# Patient Record
Sex: Male | Born: 1955 | Race: Black or African American | Hispanic: No | Smoking: Former smoker
Health system: Southern US, Community
[De-identification: ages and names within clinical notes are randomized; demographics above are authoritative.]

## PROBLEM LIST (undated history)

## (undated) DIAGNOSIS — E119 Type 2 diabetes mellitus without complications: Secondary | ICD-10-CM

## (undated) DIAGNOSIS — E785 Hyperlipidemia, unspecified: Secondary | ICD-10-CM

## (undated) DIAGNOSIS — J189 Pneumonia, unspecified organism: Secondary | ICD-10-CM

## (undated) DIAGNOSIS — K219 Gastro-esophageal reflux disease without esophagitis: Secondary | ICD-10-CM

## (undated) DIAGNOSIS — I639 Cerebral infarction, unspecified: Secondary | ICD-10-CM

## (undated) DIAGNOSIS — I10 Essential (primary) hypertension: Secondary | ICD-10-CM

## (undated) HISTORY — DX: Hyperlipidemia, unspecified: E78.5

## (undated) HISTORY — DX: Cerebral infarction, unspecified: I63.9

## (undated) HISTORY — PX: CARPAL TUNNEL RELEASE: SHX101

---

## 1997-12-11 ENCOUNTER — Emergency Department (HOSPITAL_COMMUNITY): Admission: EM | Admit: 1997-12-11 | Discharge: 1997-12-11 | Payer: Self-pay | Admitting: Emergency Medicine

## 1998-02-27 ENCOUNTER — Emergency Department (HOSPITAL_COMMUNITY): Admission: EM | Admit: 1998-02-27 | Discharge: 1998-02-27 | Payer: Self-pay | Admitting: Emergency Medicine

## 1998-05-28 ENCOUNTER — Encounter: Admission: RE | Admit: 1998-05-28 | Discharge: 1998-06-13 | Payer: Self-pay | Admitting: Orthopedic Surgery

## 1998-06-13 ENCOUNTER — Inpatient Hospital Stay (HOSPITAL_COMMUNITY): Admission: EM | Admit: 1998-06-13 | Discharge: 1998-06-15 | Payer: Self-pay | Admitting: Internal Medicine

## 1999-09-19 ENCOUNTER — Ambulatory Visit (HOSPITAL_BASED_OUTPATIENT_CLINIC_OR_DEPARTMENT_OTHER): Admission: RE | Admit: 1999-09-19 | Discharge: 1999-09-19 | Payer: Self-pay | Admitting: Orthopedic Surgery

## 1999-10-17 ENCOUNTER — Ambulatory Visit (HOSPITAL_BASED_OUTPATIENT_CLINIC_OR_DEPARTMENT_OTHER): Admission: RE | Admit: 1999-10-17 | Discharge: 1999-10-17 | Payer: Self-pay | Admitting: Orthopedic Surgery

## 2000-05-10 ENCOUNTER — Encounter: Payer: Self-pay | Admitting: Emergency Medicine

## 2000-05-10 ENCOUNTER — Emergency Department (HOSPITAL_COMMUNITY): Admission: EM | Admit: 2000-05-10 | Discharge: 2000-05-10 | Payer: Self-pay | Admitting: Emergency Medicine

## 2001-05-31 ENCOUNTER — Emergency Department (HOSPITAL_COMMUNITY): Admission: EM | Admit: 2001-05-31 | Discharge: 2001-05-31 | Payer: Self-pay | Admitting: Emergency Medicine

## 2007-06-25 ENCOUNTER — Ambulatory Visit: Payer: Self-pay | Admitting: Gastroenterology

## 2007-07-29 ENCOUNTER — Ambulatory Visit: Payer: Self-pay | Admitting: Gastroenterology

## 2007-08-06 ENCOUNTER — Encounter: Payer: Self-pay | Admitting: Gastroenterology

## 2007-08-06 ENCOUNTER — Ambulatory Visit: Payer: Self-pay | Admitting: Gastroenterology

## 2007-12-28 ENCOUNTER — Telehealth: Payer: Self-pay | Admitting: Gastroenterology

## 2007-12-29 ENCOUNTER — Emergency Department (HOSPITAL_COMMUNITY): Admission: EM | Admit: 2007-12-29 | Discharge: 2007-12-29 | Payer: Self-pay | Admitting: Emergency Medicine

## 2007-12-29 DIAGNOSIS — Z860100 Personal history of colon polyps, unspecified: Secondary | ICD-10-CM | POA: Insufficient documentation

## 2007-12-29 DIAGNOSIS — E1165 Type 2 diabetes mellitus with hyperglycemia: Secondary | ICD-10-CM

## 2007-12-30 ENCOUNTER — Ambulatory Visit: Payer: Self-pay | Admitting: Gastroenterology

## 2007-12-30 DIAGNOSIS — R079 Chest pain, unspecified: Secondary | ICD-10-CM

## 2007-12-30 DIAGNOSIS — K219 Gastro-esophageal reflux disease without esophagitis: Secondary | ICD-10-CM

## 2007-12-30 DIAGNOSIS — R112 Nausea with vomiting, unspecified: Secondary | ICD-10-CM

## 2007-12-30 LAB — CONVERTED CEMR LAB
ALT: 36 units/L (ref 0–53)
AST: 26 units/L (ref 0–37)
Albumin: 3.9 g/dL (ref 3.5–5.2)
Alkaline Phosphatase: 57 units/L (ref 39–117)
Amylase: 151 units/L — ABNORMAL HIGH (ref 27–131)
BUN: 13 mg/dL (ref 6–23)
Basophils Absolute: 0.1 10*3/uL (ref 0.0–0.1)
Basophils Relative: 1 % (ref 0.0–3.0)
Bilirubin, Direct: 0.2 mg/dL (ref 0.0–0.3)
CO2: 31 meq/L (ref 19–32)
Calcium: 9.2 mg/dL (ref 8.4–10.5)
Chloride: 101 meq/L (ref 96–112)
Creatinine, Ser: 1.2 mg/dL (ref 0.4–1.5)
Eosinophils Absolute: 0.3 10*3/uL (ref 0.0–0.7)
Eosinophils Relative: 4.3 % (ref 0.0–5.0)
GFR calc Af Amer: 82 mL/min
GFR calc non Af Amer: 68 mL/min
Glucose, Bld: 103 mg/dL — ABNORMAL HIGH (ref 70–99)
HCT: 46.2 % (ref 39.0–52.0)
Hemoglobin: 16.2 g/dL (ref 13.0–17.0)
Hgb A1c MFr Bld: 5.8 % (ref 4.6–6.0)
Lipase: 33 units/L (ref 11.0–59.0)
Lymphocytes Relative: 40.2 % (ref 12.0–46.0)
MCHC: 34.9 g/dL (ref 30.0–36.0)
MCV: 93.9 fL (ref 78.0–100.0)
Monocytes Absolute: 0.6 10*3/uL (ref 0.1–1.0)
Monocytes Relative: 8.9 % (ref 3.0–12.0)
Neutro Abs: 3 10*3/uL (ref 1.4–7.7)
Neutrophils Relative %: 45.6 % (ref 43.0–77.0)
Platelets: 219 10*3/uL (ref 150–400)
Potassium: 4.3 meq/L (ref 3.5–5.1)
RBC: 4.92 M/uL (ref 4.22–5.81)
RDW: 12 % (ref 11.5–14.6)
Sodium: 139 meq/L (ref 135–145)
TSH: 0.42 microintl units/mL (ref 0.35–5.50)
Total Bilirubin: 1 mg/dL (ref 0.3–1.2)
Total Protein: 7.1 g/dL (ref 6.0–8.3)
WBC: 6.7 10*3/uL (ref 4.5–10.5)

## 2008-01-04 ENCOUNTER — Encounter: Payer: Self-pay | Admitting: Gastroenterology

## 2008-01-05 ENCOUNTER — Ambulatory Visit: Payer: Self-pay | Admitting: Gastroenterology

## 2008-01-05 LAB — CONVERTED CEMR LAB: UREASE: NEGATIVE

## 2008-01-06 ENCOUNTER — Ambulatory Visit (HOSPITAL_COMMUNITY): Admission: RE | Admit: 2008-01-06 | Discharge: 2008-01-06 | Payer: Self-pay | Admitting: Gastroenterology

## 2008-01-10 ENCOUNTER — Telehealth: Payer: Self-pay | Admitting: Gastroenterology

## 2008-01-20 ENCOUNTER — Telehealth: Payer: Self-pay | Admitting: Gastroenterology

## 2008-02-16 ENCOUNTER — Telehealth: Payer: Self-pay | Admitting: Gastroenterology

## 2008-02-17 ENCOUNTER — Ambulatory Visit: Payer: Self-pay | Admitting: Internal Medicine

## 2008-02-17 DIAGNOSIS — R1319 Other dysphagia: Secondary | ICD-10-CM

## 2008-02-23 ENCOUNTER — Ambulatory Visit (HOSPITAL_COMMUNITY): Admission: RE | Admit: 2008-02-23 | Discharge: 2008-02-23 | Payer: Self-pay | Admitting: Internal Medicine

## 2008-02-29 ENCOUNTER — Telehealth: Payer: Self-pay | Admitting: Gastroenterology

## 2008-03-07 ENCOUNTER — Ambulatory Visit: Payer: Self-pay | Admitting: Gastroenterology

## 2008-03-10 ENCOUNTER — Ambulatory Visit (HOSPITAL_COMMUNITY): Admission: RE | Admit: 2008-03-10 | Discharge: 2008-03-10 | Payer: Self-pay | Admitting: Gastroenterology

## 2008-03-24 ENCOUNTER — Telehealth: Payer: Self-pay | Admitting: Gastroenterology

## 2008-03-27 ENCOUNTER — Encounter: Payer: Self-pay | Admitting: Gastroenterology

## 2008-03-27 ENCOUNTER — Ambulatory Visit (HOSPITAL_COMMUNITY): Admission: RE | Admit: 2008-03-27 | Discharge: 2008-03-27 | Payer: Self-pay | Admitting: Gastroenterology

## 2008-04-10 ENCOUNTER — Telehealth: Payer: Self-pay | Admitting: Gastroenterology

## 2008-04-20 ENCOUNTER — Ambulatory Visit: Payer: Self-pay | Admitting: Gastroenterology

## 2008-04-20 LAB — CONVERTED CEMR LAB: Anti Nuclear Antibody(ANA): NEGATIVE

## 2008-06-27 ENCOUNTER — Telehealth: Payer: Self-pay | Admitting: Gastroenterology

## 2008-07-11 ENCOUNTER — Encounter (INDEPENDENT_AMBULATORY_CARE_PROVIDER_SITE_OTHER): Payer: Self-pay | Admitting: *Deleted

## 2008-07-25 ENCOUNTER — Ambulatory Visit: Payer: Self-pay | Admitting: Gastroenterology

## 2008-07-26 ENCOUNTER — Telehealth: Payer: Self-pay | Admitting: Gastroenterology

## 2008-08-07 ENCOUNTER — Encounter: Payer: Self-pay | Admitting: Gastroenterology

## 2008-08-07 ENCOUNTER — Ambulatory Visit: Payer: Self-pay | Admitting: Gastroenterology

## 2008-08-09 ENCOUNTER — Encounter: Payer: Self-pay | Admitting: Gastroenterology

## 2008-09-18 ENCOUNTER — Telehealth: Payer: Self-pay | Admitting: Gastroenterology

## 2009-10-25 ENCOUNTER — Telehealth: Payer: Self-pay | Admitting: Gastroenterology

## 2010-04-28 ENCOUNTER — Encounter: Payer: Self-pay | Admitting: Internal Medicine

## 2010-05-09 NOTE — Progress Notes (Signed)
Summary: med concern  Phone Note Call from Patient Call back at [phone]   Caller: Patient Call For: Dr. Jarold Motto Reason for Call: Refill Medication Summary of Call: having trouble filling Lansoprazole... pharmacy telling pt that they have not [facility] [address]... pt would [facility] Initial call taken by: Vallarie Mare,  October 25, 2009 8:45 AM  Follow-up for Phone Call        Talked with pt.  [facility] [address]   Sent as requested. Follow-up by: Ashok Cordia RN,  October 25, 2009 9:09 AM    Prescriptions: LANSOPRAZOLE 30 MG CPDR (LANSOPRAZOLE) take on by mouth once daily  #30 x 11   Entered by:   Ashok Cordia RN   Authorized by:   Mardella Layman MD [facility]   Signed by:   Ashok Cordia RN on 10/25/2009   Method used:   [facility]        [facility] [address] [phone]* (retail)       [address]       [place], [place]  [address]       Ph: [phone] or [phone]       Fax: [phone]   RxID:   [id number]

## 2010-08-20 NOTE — Assessment & Plan Note (Signed)
[facility]                         GASTROENTEROLOGY OFFICE NOTE   BRADIE, LACOCK                          MRN:          [id number]  DATE:07/29/2007                            DOB:          August 04, 1955    The patient is a 55 year old white male referred through the courtesy of  Dr. Farris Has for evaluation of guaiac positive stools determined on  physical examination on June 01, 2007.   The patient has had adult onset diabetes mellitus for the last two years  well controlled with Metformin 500 mg twice a day and glipizide XL 10 mg  twice a day.  To me he really denies any GI or general medical problems  at this time and apparently has had no complications from his diabetes.  He recently saw Dr. Farris Has for his annual checkup and had guaiac  positive stools.  Laboratory tests are still pending from that  examination.  The patient simply denies melena or hematochezia, bowel  irregularity, abdominal pain, or any upper GI or hepatobiliary  complaints.  His appetite is good.  His weight is stable.  He has never  had previous endoscopic or barium studies of his bowels.   FAMILY HISTORY:  Remarkable for breast cancer in his mother and prostate  cancer in a brother.   SOCIAL HISTORY:  He is separated and lives alone.  He has a twelfth  grade education and works for [employer].  He smokes a pack of cigarettes every  several days does not have any history of ethanol abuse or dependence.   REVIEW OF SYSTEMS:  Noncontributory except for some vague arthralgias.   PHYSICAL EXAMINATION:  GENERAL:  He is a healthy appearing black male in  no distress appearing his stated age.  VITAL SIGNS:  He is 5 feet 11-1/2 inches and weighs 219 pounds.  Blood  pressure 122/80, pulse 72 and regular.  I could not appreciate stigmata  of chronic liver disease or thyromegaly.  CHEST:  Clear.  HEART:  Regular rhythm without murmurs, rubs, or gallops.  ABDOMEN:  There is no  hepatosplenomegaly, abdominal masses, or  tenderness.  Bowel sounds are normal.  EXTREMITIES:  Peripheral extremities were unremarkable.  NEUROLOGY:  Mental status was clear.  RECTAL:  Deferred.   ASSESSMENT:  1. Guaiac positive stools, probably colonic polyposis.  2. Adult onset diabetes mellitus without complications.  3. Probable degenerative arthritis.   RECOMMENDATIONS:  1. Obtain recent lab data for review from Dr. Farris Has.  2. Outpatient colonoscopy with adjustment of the patient's diabetic      medications.     Vania Rea. Jarold Motto, MD, Caleen Essex, FAGA  Electronically Signed    DRP/MedQ  DD: 07/29/2007  DT: 07/29/2007  Job #: [id number]   cc:   Estell Harpin, M.D.

## 2010-08-23 NOTE — Op Note (Signed)
[place]  Patient:    Nathaniel Waters, Nathaniel Waters                          MRN: [id number] Proc. Date: 10/17/99 Adm. Date:  [id number] Disc. Date: [id number] Attending:  Colbert Ewing                           Operative Report  PREOPERATIVE DIAGNOSIS:  Right carpal tunnel syndrome.  POSTOPERATIVE DIAGNOSIS:  Right carpal tunnel syndrome.  OPERATION:  Right carpal tunnel release.  SURGEON:  Loreta Ave, M.D.  ASSISTANT:  Arlys John D. Petrarca, P.A.-C.  ANESTHESIA:  IV regional.  COMPLICATIONS:  None.  CULTURES:  None.  DRESSING:  Soft compressive with bulky hand dressing and splint.  DESCRIPTION OF PROCEDURE:  The patient was brought to the operating room and placed on the operating table in supine position.  After adequate anesthesia had been obtained, prepped and draped in the usual sterile fashion.  A curved incision along the thenar eminence, heading slightly unlar-ward at the distal wrist crease.  Skin and subcutaneous tissue divided.  Flexor retinaculum exposed and incised under direct visualization from the forearm fascia proximally to the palmar arch distally.  Care was taken to protect superficial branch of the median nerve to the palm and also the underlying nerve within the carpal tunnel.  Venous branches, motor branches identified, protected, and decompressed.  Moderate diffuse constriction on the nerve throughout the canal, but everything left in continuity.  No other abnormalities seen in the carpal tunnel.  Wound irrigated.  Skin closed with nylon and Mersilene, injected Marcaine.  Sterile compressive dressing and splint applied. Anesthesia reversed and brought to the recovery room.  The patient tolerated the surgery well with no complications. DD:  10/17/99 TD:  10/17/99 Job: [id number]

## 2010-08-23 NOTE — Op Note (Signed)
[place]  Patient:    Nathaniel Waters, Nathaniel Waters                          MRN: [id number] Proc. Date: 09/19/99 Adm. Date:  [id number] Disc. Date: [id number] Attending:  Colbert Ewing                           Operative Report  PREOPERATIVE DIAGNOSIS:  Left carpal tunnel syndrome.  POSTOPERATIVE DIAGNOSIS:  Left carpal tunnel syndrome.  OPERATIVE PROCEDURE:  Left carpal tunnel release with epineurotomy.  SURGEON:  Loreta Ave, M.D.  ASSISTANT:  Arlys John D. Petrarca, P.A.-C.  ANESTHESIA:  IV regional.  SPECIMENS:  None.  CULTURES:  None.  COMPLICATIONS:  None.  DRESSING:  Soft compressive with bulky hand dressing and splint.  DESCRIPTION OF PROCEDURE:  Patient brought to the operating room and after adequate anesthesia had been obtained, left arm examined and prepped and draped in the usual sterile fashion.  A curved incision along the thenar eminence, heading slightly ulnarward at the distal wrist crease.  Skin and subcutaneous tissue divided.  Flexor retinaculum exposed and incised under direct visualization from the forearm fascia proximally to the palmar arch distally.  Extremely tight, very thickened aponeurosis.  Moderate amount of constriction on the nerve itself with a very thickened epineurium.  The epineurium incised, protecting the nerve.  Digital branches, motor branches identified and protected, completely decompressed.  No other abnormalities in the carpal canal.  Wound irrigated.  Closed at the skin with nylon.  Margins of the wound injected with Marcaine.  Sterile compressive dressing with bulky hand dressing applied and splint.  Tourniquet inflated and removed. Anesthesia reversed, brought to recovery room.  Tolerated surgery well with no complications. DD:  09/19/99 TD:  09/23/99 Job: [id number]

## 2010-10-21 ENCOUNTER — Other Ambulatory Visit: Payer: Self-pay | Admitting: Gastroenterology

## 2010-10-23 ENCOUNTER — Other Ambulatory Visit: Payer: Self-pay | Admitting: Gastroenterology

## 2010-10-24 ENCOUNTER — Other Ambulatory Visit: Payer: Self-pay | Admitting: Gastroenterology

## 2010-10-25 ENCOUNTER — Other Ambulatory Visit: Payer: Self-pay | Admitting: Gastroenterology

## 2010-10-25 MED ORDER — OMEPRAZOLE 20 MG PO CPDR: DELAYED_RELEASE_CAPSULE | ORAL | Status: DC

## 2010-10-25 NOTE — Telephone Encounter (Addendum)
Pt has not been seen since 2010. His phone is blocked so I can't inform him; left note on refill he needs to call us. Pt given one month's supply.

## 2010-11-18 ENCOUNTER — Inpatient Hospital Stay (INDEPENDENT_AMBULATORY_CARE_PROVIDER_SITE_OTHER)
Admission: RE | Admit: 2010-11-18 | Discharge: 2010-11-18 | Disposition: A | Discharge status: Home / Self Care | Payer: BC Managed Care – PPO | Source: Ambulatory Visit | Attending: Family Medicine | Admitting: Family Medicine

## 2010-11-18 DIAGNOSIS — T148 Other injury of unspecified body region: Secondary | ICD-10-CM

## 2010-11-21 ENCOUNTER — Other Ambulatory Visit: Payer: Self-pay | Admitting: Gastroenterology

## 2010-11-25 ENCOUNTER — Telehealth: Payer: Self-pay | Admitting: Gastroenterology

## 2010-11-25 NOTE — Telephone Encounter (Signed)
Advised pt that he needs ov before we can refill his PPI he says that the Prevacid worked better than his prilosec. I advised him he can get PPI OTC but he needs ov to get one from the MD.

## 2010-12-21 ENCOUNTER — Other Ambulatory Visit: Payer: Self-pay | Admitting: Gastroenterology

## 2011-05-31 ENCOUNTER — Emergency Department (HOSPITAL_COMMUNITY)
Admission: EM | Admit: 2011-05-31 | Discharge: 2011-05-31 | Disposition: A | Discharge status: Home Health | Payer: BC Managed Care – PPO | Source: Home / Self Care | Attending: Emergency Medicine | Admitting: Emergency Medicine

## 2011-05-31 ENCOUNTER — Emergency Department (INDEPENDENT_AMBULATORY_CARE_PROVIDER_SITE_OTHER): Payer: BC Managed Care – PPO

## 2011-05-31 ENCOUNTER — Encounter (HOSPITAL_COMMUNITY): Payer: Self-pay | Admitting: Emergency Medicine

## 2011-05-31 DIAGNOSIS — M25569 Pain in unspecified knee: Secondary | ICD-10-CM

## 2011-05-31 HISTORY — DX: Gastro-esophageal reflux disease without esophagitis: K21.9

## 2011-05-31 NOTE — ED Notes (Addendum)
Left leg pain onset 2 weeks ago.  Reports pain in leg has gradually worsened, especially yesterday.  Patient reports having difficulty walking yesterday, today pain is not as significant.  Patient touches left knee and moves hands up thigh as location and radiation of leg pain.  No known injury

## 2011-05-31 NOTE — ED Provider Notes (Signed)
Medical screening examination/treatment/procedure(s) were performed by non-physician practitioner and as supervising physician I was immediately available for consultation/collaboration.  Leslee Home, M.D.   Roque Lias, MD 05/31/11 [phone]

## 2011-05-31 NOTE — ED Notes (Signed)
Instructed to put on patient gown 

## 2011-05-31 NOTE — Discharge Instructions (Signed)
Knee Pain °Knee pain can be a result of an injury or other medical conditions. Treatment will depend on the cause of your pain. °HOME CARE °· Only take medicine as told by your doctor.  °· Keep a healthy weight. Being overweight can make the knee hurt more.  °· Stretch before exercising or playing sports.  °· If there is constant knee pain, change the way you exercise. Ask your doctor for advice.  °· Make sure shoes fit well. Choose the right shoe for the sport or activity.  °· Protect your knees. Wear kneepads if needed.  °· Rest when you are tired.  °GET HELP RIGHT AWAY IF:  °· Your knee pain does not stop.  °· Your knee pain does not get better.  °· Your knee joint feels hot to the touch.  °· You have a temperature by mouth above 102° F (38.9° C), not controlled by medicine.  °· Your baby is older than 3 months with a rectal temperature of 102° F (38.9° C) or higher.  °· Your baby is 3 months old or younger with a rectal temperature of 100.4° F (38° C) or higher.  °MAKE SURE YOU:  °· Understand these instructions.  °· Will watch this condition.  °· Will get help right away if you are not doing well or get worse.  °Document Released: 06/20/2008 Document Revised: 12/04/2010 Document Reviewed: 06/20/2008 °ExitCare® Patient Information ©2012 [employer]. °

## 2011-05-31 NOTE — ED Provider Notes (Signed)
History     CSN: [id number]  Arrival date & time 05/31/11  1035   First MD Initiated Contact with Patient 05/31/11 1243      Chief Complaint  Patient presents with  . Leg Pain    (Consider location/radiation/quality/duration/timing/severity/associated sxs/prior treatment) Patient is a 56 y.o. male presenting with knee pain. The history is provided by the patient. No language interpreter was used.  Knee Pain This is a new problem. The current episode started yesterday. The problem has been resolved. The symptoms are aggravated by nothing. The symptoms are relieved by nothing. He has tried nothing for the symptoms. The treatment provided mild relief.  Pt reports pain in left knee,  Pt reports pain with moving leg,  Pt reports pain started yesteday.  Pain has resolved.  (Pt has no dvt risk)   Past Medical History  Diagnosis Date  . Diabetes mellitus   . GERD (gastroesophageal reflux disease)     Past Surgical History  Procedure Date  . Carpal tunnel release     No family history on file.  History  Substance Use Topics  . Smoking status: Current Everyday Smoker  . Smokeless tobacco: Not on file  . Alcohol Use: Yes      Review of Systems  Musculoskeletal: Negative for myalgias, joint swelling and gait problem.  All other systems reviewed and are negative.    Allergies  Review of patient's allergies indicates no known allergies.  Home Medications   Current Outpatient Rx  Name Route Sig Dispense Refill  . GLIPIZIDE 10 MG PO TABS Oral Take 10 mg by mouth 2 (two) times daily before a meal.    . LANSOPRAZOLE 15 MG PO CPDR Oral Take 15 mg by mouth daily.    Marland Kitchen METFORMIN HCL 500 MG PO TABS Oral Take 500 mg by mouth 2 (two) times daily with a meal.    . OMEPRAZOLE 20 MG PO CPDR  Take 2 tablets by mouth daily. 60 capsule 0    No refills until he comes in for an office visit-  ...    BP 153/62  Pulse 74  Temp(Src) 98 F (36.7 C) (Oral)  Resp 17  SpO2 95%  Physical  Exam  Vitals reviewed. Constitutional: He is oriented to person, place, and time. He appears well-developed and well-nourished.  Musculoskeletal: He exhibits no edema and no tenderness.       No swelling no deformity,  Homans negative  Neurological: He is alert and oriented to person, place, and time. He has normal reflexes.  Skin: Skin is warm and dry.  Psychiatric: He has a normal mood and affect.    ED Course  Procedures (including critical care time)  Labs Reviewed - No data to display Dg Knee Complete 4 Views Left  05/31/2011  *RADIOLOGY REPORT*  Clinical Data: 56 year old male with left knee pain.  LEFT KNEE - COMPLETE 4+ VIEW  Comparison: None  Findings: No evidence of acute fracture, subluxation or dislocation identified.  No joint effusion noted.  No radio-opaque foreign bodies are present.  No focal bony lesions are noted.  The joint spaces are unremarkable.  IMPRESSION: No significant abnormalities.  Original Report Authenticated By: Rosendo Gros, M.D.     No diagnosis found.    MDM  Pt advised to follow up with Dr. Ranell Patrick for recheck next week if pain returns/persist.        Langston Masker, PA 05/31/11 1348

## 2011-06-17 ENCOUNTER — Encounter: Payer: Self-pay | Admitting: Gastroenterology

## 2011-08-18 ENCOUNTER — Encounter: Payer: Self-pay | Admitting: Gastroenterology

## 2011-08-29 ENCOUNTER — Encounter: Payer: Self-pay | Admitting: Gastroenterology

## 2011-08-29 ENCOUNTER — Ambulatory Visit (AMBULATORY_SURGERY_CENTER): Payer: BC Managed Care – PPO | Admitting: *Deleted

## 2011-08-29 VITALS — Ht 70.5 in | Wt 224.1 lb

## 2011-08-29 DIAGNOSIS — Z86010 Personal history of colonic polyps: Secondary | ICD-10-CM

## 2011-08-29 DIAGNOSIS — Z1211 Encounter for screening for malignant neoplasm of colon: Secondary | ICD-10-CM

## 2011-08-29 MED ORDER — PEG-KCL-NACL-NASULF-NA ASC-C 100 G PO SOLR: ORAL | Status: DC

## 2011-08-29 NOTE — Progress Notes (Signed)
Pt was taking Metformin and Glipizide.  He has been off of for several months and monitoring his diet and exercise

## 2011-09-10 ENCOUNTER — Encounter: Payer: Self-pay | Admitting: Gastroenterology

## 2011-09-10 ENCOUNTER — Ambulatory Visit (AMBULATORY_SURGERY_CENTER): Payer: BC Managed Care – PPO | Admitting: Gastroenterology

## 2011-09-10 VITALS — BP 144/91 | HR 72 | Temp 97.6°F | Resp 18 | Ht 70.0 in | Wt 224.0 lb

## 2011-09-10 DIAGNOSIS — Z1211 Encounter for screening for malignant neoplasm of colon: Secondary | ICD-10-CM

## 2011-09-10 DIAGNOSIS — Z860100 Personal history of colon polyps, unspecified: Secondary | ICD-10-CM

## 2011-09-10 LAB — GLUCOSE, CAPILLARY: Glucose-Capillary: 107 mg/dL — ABNORMAL HIGH (ref 70–99)

## 2011-09-10 MED ORDER — SODIUM CHLORIDE 0.9 % IV SOLN: 500.0000 mL | INTRAVENOUS | Status: DC

## 2011-09-10 NOTE — Progress Notes (Signed)
Patient did not experience any of the following events: a burn prior to discharge; a fall within the facility; wrong site/side/patient/procedure/implant event; or a hospital transfer or hospital admission upon discharge from the facility. (G8907) Patient did not have preoperative order for IV antibiotic SSI prophylaxis. (G8918)  

## 2011-09-10 NOTE — Patient Instructions (Signed)

## 2011-09-10 NOTE — Op Note (Signed)
[facility] [address] [place], [place]  [address]  COLONOSCOPY PROCEDURE REPORT  PATIENT:  Nathaniel, Waters  MR#:  [id number] BIRTHDATE:  1955-12-27, 55 yrs. old  GENDER:  male ENDOSCOPIST:  Vania Rea. Jarold Motto, MD, [facility] REF. BY: PROCEDURE DATE:  09/10/2011 PROCEDURE:  Surveillance Colonoscopy ASA CLASS:  Class II INDICATIONS:  history of pre-cancerous (adenomatous) colon polyps  MEDICATIONS:   propofol (Diprivan) 200 mg IV  DESCRIPTION OF PROCEDURE:   After the risks and benefits and of the procedure were explained, informed consent was obtained. Digital rectal exam was performed and revealed no abnormalities. The LB [id number] endoscope was introduced through the anus and advanced to the cecum, which was identified by both the appendix and ileocecal valve.  The quality of the prep was good, using MoviPrep.  The instrument was then slowly withdrawn as the colon was fully examined. <<PROCEDUREIMAGES>>  FINDINGS:  No polyps or cancers were seen.  This was otherwise a normal examination of the colon.   Retroflexed views in the rectum revealed no abnormalities.    The scope was then withdrawn from the patient and the procedure completed.  COMPLICATIONS:  None ENDOSCOPIC IMPRESSION: 1) No polyps or cancers 2) Otherwise normal examination RECOMMENDATIONS: 1) Repeat Colonoscopy in 5 years.  REPEAT EXAM:  No  ______________________________ Vania Rea. Jarold Motto, MD, Clementeen Graham  CC:  [name], MD  n. [name]:   [name] at 09/10/2011 10:17 AM  Kandis Mannan, [phone]

## 2011-09-10 NOTE — Progress Notes (Signed)
PROPOFOL PER S CAMP CRNA. SEE SCANNED INTRA PROCEDURE REPORT. EWM 

## 2011-09-11 ENCOUNTER — Telehealth: Payer: Self-pay

## 2011-09-11 NOTE — Telephone Encounter (Signed)
  Follow up Call-  Call back number 09/10/2011  Post procedure Call Back phone  # [phone]  Permission to leave phone message Yes     Patient questions:  Do you have a fever, pain , or abdominal swelling? no Pain Score  0 *  Have you tolerated food without any problems? yes  Have you been able to return to your normal activities? yes  Do you have any questions about your discharge instructions: Diet   no Medications  no Follow up visit  no  Do you have questions or concerns about your Care? no  Actions: * If pain score is 4 or above: No action needed, pain <4.

## 2014-08-23 ENCOUNTER — Encounter: Payer: Self-pay | Admitting: Gastroenterology

## 2014-09-07 ENCOUNTER — Ambulatory Visit: Payer: Self-pay | Admitting: Family

## 2014-09-21 ENCOUNTER — Other Ambulatory Visit (INDEPENDENT_AMBULATORY_CARE_PROVIDER_SITE_OTHER): Payer: BLUE CROSS/BLUE SHIELD

## 2014-09-21 ENCOUNTER — Ambulatory Visit (INDEPENDENT_AMBULATORY_CARE_PROVIDER_SITE_OTHER): Payer: BLUE CROSS/BLUE SHIELD | Admitting: Family

## 2014-09-21 ENCOUNTER — Encounter: Payer: Self-pay | Admitting: Family

## 2014-09-21 ENCOUNTER — Telehealth: Payer: Self-pay | Admitting: Family

## 2014-09-21 VITALS — BP 148/90 | HR 85 | Temp 98.2°F | Resp 18 | Ht 71.0 in | Wt 220.0 lb

## 2014-09-21 DIAGNOSIS — E785 Hyperlipidemia, unspecified: Secondary | ICD-10-CM

## 2014-09-21 DIAGNOSIS — Z0000 Encounter for general adult medical examination without abnormal findings: Secondary | ICD-10-CM

## 2014-09-21 DIAGNOSIS — R7989 Other specified abnormal findings of blood chemistry: Secondary | ICD-10-CM | POA: Diagnosis not present

## 2014-09-21 DIAGNOSIS — R972 Elevated prostate specific antigen [PSA]: Secondary | ICD-10-CM

## 2014-09-21 DIAGNOSIS — Z716 Tobacco abuse counseling: Secondary | ICD-10-CM | POA: Diagnosis not present

## 2014-09-21 DIAGNOSIS — Z0001 Encounter for general adult medical examination with abnormal findings: Secondary | ICD-10-CM | POA: Insufficient documentation

## 2014-09-21 DIAGNOSIS — IMO0002 Reserved for concepts with insufficient information to code with codable children: Secondary | ICD-10-CM

## 2014-09-21 DIAGNOSIS — Z720 Tobacco use: Secondary | ICD-10-CM | POA: Diagnosis not present

## 2014-09-21 DIAGNOSIS — E1165 Type 2 diabetes mellitus with hyperglycemia: Secondary | ICD-10-CM | POA: Diagnosis not present

## 2014-09-21 LAB — CBC
HEMATOCRIT: 48 % (ref 39.0–52.0)
Hemoglobin: 16.6 g/dL (ref 13.0–17.0)
MCHC: 34.5 g/dL (ref 30.0–36.0)
MCV: 91 fl (ref 78.0–100.0)
Platelets: 192 10*3/uL (ref 150.0–400.0)
RBC: 5.28 Mil/uL (ref 4.22–5.81)
RDW: 13.3 % (ref 11.5–15.5)
WBC: 8.9 10*3/uL (ref 4.0–10.5)

## 2014-09-21 LAB — MICROALBUMIN / CREATININE URINE RATIO
Creatinine,U: 138.4 mg/dL
Microalb Creat Ratio: 3.3 mg/g (ref 0.0–30.0)
Microalb, Ur: 4.6 mg/dL — ABNORMAL HIGH (ref 0.0–1.9)

## 2014-09-21 LAB — COMPREHENSIVE METABOLIC PANEL
ALK PHOS: 101 U/L (ref 39–117)
ALT: 26 U/L (ref 0–53)
AST: 19 U/L (ref 0–37)
Albumin: 4.2 g/dL (ref 3.5–5.2)
BILIRUBIN TOTAL: 0.5 mg/dL (ref 0.2–1.2)
BUN: 19 mg/dL (ref 6–23)
CO2: 26 meq/L (ref 19–32)
CREATININE: 1.17 mg/dL (ref 0.40–1.50)
Calcium: 9.7 mg/dL (ref 8.4–10.5)
Chloride: 99 mEq/L (ref 96–112)
GFR: 81.91 mL/min (ref >60.00)
Glucose, Bld: 311 mg/dL — ABNORMAL HIGH (ref 70–99)
Potassium: 4.3 mEq/L (ref 3.5–5.1)
SODIUM: 134 meq/L — AB (ref 135–145)
TOTAL PROTEIN: 7.2 g/dL (ref 6.0–8.3)

## 2014-09-21 LAB — HEMOGLOBIN A1C: Hgb A1c MFr Bld: 10.5 % — ABNORMAL HIGH (ref 4.6–6.5)

## 2014-09-21 LAB — LIPID PANEL
Cholesterol: 186 mg/dL (ref 0–200)
HDL: 31.5 mg/dL — ABNORMAL LOW (ref >39.00)
NonHDL: 154.5
TRIGLYCERIDES: 251 mg/dL — AB (ref 0.0–149.0)
Total CHOL/HDL Ratio: 6
VLDL: 50.2 mg/dL — ABNORMAL HIGH (ref 0.0–40.0)

## 2014-09-21 LAB — TSH: TSH: 1.39 u[IU]/mL (ref 0.35–4.50)

## 2014-09-21 LAB — PSA: PSA: 5.45 ng/mL — ABNORMAL HIGH (ref 0.10–4.00)

## 2014-09-21 LAB — LDL CHOLESTEROL, DIRECT: Direct LDL: 113 mg/dL

## 2014-09-21 MED ORDER — LISINOPRIL 10 MG PO TABS: 10.0000 mg | ORAL_TABLET | Freq: Every day | ORAL | Status: DC

## 2014-09-21 NOTE — Assessment & Plan Note (Signed)
1) Anticipatory Guidance: Discussed importance of wearing a seatbelt while driving and not texting while driving; changing batteries in smoke detector at least once annually; wearing suntan lotion when outside; eating a balanced and moderate diet; getting physical activity at least 30 minutes per day.  2) Immunizations / Screenings / Labs:  Patient is due for a tetanus shot which will be completed next office visit per his request. All other immunizations are up-to-date per recommendations. All screenings are up-to-date per recommendations. Obtain CBC, CMET, HIV, PSA, Lipid profile and TSH.   Overall well exam. Patient has risk factors including hypertension, diabetes, smoking, and diabetes. Hypertension is slightly elevated today and will start lisinopril. Continue to monitor blood pressure at home. Diabetes is uncontrolled at this time and see under plan for diabetes diagnosis. He is currently interested in smoking cessation - note plan under smoking cessation counseling. He is obese according to his BMI. Recommend increasing nutrient density was diet and decreasing saturated fats. 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 Increase physical activity independent of work to 30 minutes most days of the week as tolerated. Continue other healthy lifestyle choices and behaviors. Follow up pending blood work; follow-up prevention exam in one year.

## 2014-09-21 NOTE — Assessment & Plan Note (Signed)
Previously diagnosed with type 2 diabetes and notes his blood sugars in the mornings ranging greater than 200. Obtain A1c to determine current status. Diabetic eye exam has been completed. He was previously maintained on glipizide and had issues with metformin. Follow-up pending A1c results.

## 2014-09-21 NOTE — Assessment & Plan Note (Signed)
Discussed methods of smoking cessation. Patient is interested in quitting smoking and actively seeking information. Discussed risks and benefits of patches, gums, and medication. Information was provided to patient and after visit summary. Patient will research his options and contact the office with his decision.

## 2014-09-21 NOTE — Progress Notes (Signed)
Pre visit review using our clinic review tool, if applicable. No additional management support is needed unless otherwise documented below in the visit note. 

## 2014-09-21 NOTE — Telephone Encounter (Signed)
Patient uses Free Style Precision neo test strips.  Would like sent to [place] at [facility].

## 2014-09-21 NOTE — Progress Notes (Deleted)
   Subjective:    Patient ID: Nathaniel Waters, male    DOB: [birth year], 59 y.o.   MRN: [id number]  Chief Complaint  Patient presents with  . Establish Care    has diabetes that he has been trying to control on his own and said that his sugars have been in the 200s, this morning was 260, also concerned about BP    HPI:  Nathaniel Waters is a 59 y.o. male with a PMH of *** who presents today ***   Review of Systems    Objective:    BP 148/90 mmHg  Pulse 85  Temp(Src) 98.2 F (36.8 C) (Oral)  Resp 18  Ht 5\' 11"  (1.803 m)  Wt 220 lb (99.791 kg)  BMI 30.70 kg/m2  SpO2 97% Nursing note and vital signs reviewed.  Physical Exam     Assessment & Plan:

## 2014-09-21 NOTE — Progress Notes (Signed)
Subjective:    Patient ID: Nathaniel Waters, male    DOB: 11-06-1955, 59 y.o.   MRN: [id number]  Chief Complaint  Patient presents with  . Establish Care    has diabetes that he has been trying to control on his own and said that his sugars have been in the 200s, this morning was 260, also concerned about BP    HPI:  [name] is a 59 y.o. male who presents today for an annual wellness visit.   1) Health Maintenance -   Diet - Averages about 3 meals per day consisting of fruits, vegetables, chicken, red meat; 1 cup of caffeine periodically  Exercise - Does a lot of exercise at work.    2) Preventative Exams / Immunizations:  Dental -- Up to date  Vision -- Up to date   Health Maintenance  Topic Date Due  . HEMOGLOBIN A1C  1955-06-07  . PNEUMOCOCCAL POLYSACCHARIDE VACCINE (1) 03/05/1957  . FOOT EXAM  03/05/1965  . OPHTHALMOLOGY EXAM  03/05/1965  . URINE MICROALBUMIN  03/05/1965  . HIV Screening  03/05/1970  . TETANUS/TDAP  03/05/1974  . INFLUENZA VACCINE  11/06/2014  . COLONOSCOPY  09/09/2016  Tetanus shot;    There is no immunization history on file for this patient.  3.) Type 2 diabetes - patient was diagnosed with type 2 diabetes and maintained on metformin and glipizide. Indicates the metformin caused gastrointestinal distress which causes him to stop taking the medication. He also discontinued taking the glipizide because he believed he could manage his diabetes with lifestyle changes and exercise. Notes recently that his blood sugars in the mornings have been greater than 200.   Lab Results  Component Value Date   HGBA1C 5.8 12/30/2007    No Known Allergies   Outpatient Prescriptions Prior to Visit  Medication Sig Dispense Refill  . lansoprazole (PREVACID) 15 MG capsule Take 15 mg by mouth daily.    . Ibuprofen (ADVIL PO) Take by mouth as needed.     No facility-administered medications prior to visit.     Past Medical History  Diagnosis Date  .  Diabetes mellitus   . GERD (gastroesophageal reflux disease)      Past Surgical History  Procedure Laterality Date  . Carpal tunnel release      both   . Colonoscopy       Family History  Problem Relation Age of Onset  . Breast cancer Mother   . Colon cancer Neg Hx   . Esophageal cancer Neg Hx   . Stomach cancer Neg Hx   . Rectal cancer Neg Hx   . Heart disease Brother   . Lung cancer Father   . Healthy Paternal Grandmother   . Cancer Brother      History   Social History  . Marital Status: Legally Separated    Spouse Name: Nathaniel Waters  . Number of Children: 4  . Years of Education: 11   Occupational History  . Loading [employer]    Social History Main Topics  . Smoking status: Current Every Day Smoker -- 0.25 packs/day  . Smokeless tobacco: Never Used  . Alcohol Use: Yes     Comment: occasionally on weekend  . Drug Use: No  . Sexual Activity: Not on file   Other Topics Concern  . Not on file   Social History Narrative   Fun: Listen to music and be around family and friends.   Denies religious beliefs effecting health care.  Review of Systems  Constitutional: Denies fever, chills, fatigue, or significant weight gain/loss. HENT: Head: Denies headache or neck pain Ears: Denies changes in hearing, ringing in ears, earache, drainage Nose: Denies discharge, stuffiness, itching, nosebleed, sinus pain Throat: Denies sore throat, hoarseness, dry mouth, sores, thrush Eyes: Denies loss/changes in vision, pain, redness, blurry/double vision, flashing lights Cardiovascular: Denies chest pain/discomfort, tightness, palpitations, shortness of breath with activity, difficulty lying down, swelling, sudden awakening with shortness of breath Respiratory: Denies shortness of breath, cough, sputum production, wheezing Gastrointestinal: Denies dysphasia, heartburn, change in appetite, nausea, change in bowel habits, rectal bleeding, constipation, diarrhea, yellow skin or  eyes Genitourinary: Denies frequency, urgency, burning/pain, blood in urine, incontinence, change in urinary strength. Musculoskeletal: Denies muscle/joint pain, stiffness, back pain, redness or swelling of joints, trauma Skin: Denies rashes, lumps, itching, dryness, color changes, or hair/nail changes Neurological: Denies dizziness, fainting, seizures, weakness, numbness, tingling, tremor Psychiatric - Denies nervousness, stress, depression or memory loss Endocrine: Denies heat or cold intolerance, sweating, frequent urination, excessive thirst, changes in appetite Hematologic: Denies ease of bruising or bleeding     Objective:     BP 148/90 mmHg  Pulse 85  Temp(Src) 98.2 F (36.8 C) (Oral)  Resp 18  Ht '5\' 11"'  (1.803 m)  Wt 220 lb (99.791 kg)  BMI 30.70 kg/m2  SpO2 97% Nursing note and vital signs reviewed.  Physical Exam  Constitutional: He is oriented to person, place, and time. He appears well-developed and well-nourished.  HENT:  Head: Normocephalic.  Right Ear: Hearing, tympanic membrane, external ear and ear canal normal.  Left Ear: Hearing, tympanic membrane, external ear and ear canal normal.  Nose: Nose normal.  Mouth/Throat: Uvula is midline, oropharynx is clear and moist and mucous membranes are normal.  Eyes: Conjunctivae and EOM are normal. Pupils are equal, round, and reactive to light.  Neck: Neck supple. No JVD present. No tracheal deviation present. No thyromegaly present.  Cardiovascular: Normal rate, regular rhythm, normal heart sounds and intact distal pulses.   Pulmonary/Chest: Effort normal and breath sounds normal.  Abdominal: Soft. Bowel sounds are normal. He exhibits no distension and no mass. There is no tenderness. There is no rebound and no guarding.  Musculoskeletal: Normal range of motion. He exhibits no edema or tenderness.  Lymphadenopathy:    He has no cervical adenopathy.  Neurological: He is alert and oriented to person, place, and time. He  has normal reflexes. No cranial nerve deficit. He exhibits normal muscle tone. Coordination normal.  Skin: Skin is warm and dry.  Psychiatric: He has a normal mood and affect. His behavior is normal. Judgment and thought content normal.       Assessment & Plan:   Problem List Items Addressed This Visit      Other   Type II diabetes mellitus, uncontrolled    Previously diagnosed with type 2 diabetes and notes his blood sugars in the mornings ranging greater than 200. Obtain A1c to determine current status. Diabetic eye exam has been completed. He was previously maintained on glipizide and had issues with metformin. Follow-up pending A1c results.      Relevant Medications   lisinopril (PRINIVIL,ZESTRIL) 10 MG tablet   Other Relevant Orders   Hemoglobin A1c   Urine Microalbumin w/creat. ratio   Routine general medical examination at a health care facility - Primary    1) Anticipatory Guidance: Discussed importance of wearing a seatbelt while driving and not texting while driving; changing batteries in smoke detector at least once annually;  wearing suntan lotion when outside; eating a balanced and moderate diet; getting physical activity at least 30 minutes per day.  2) Immunizations / Screenings / Labs:  Patient is due for a tetanus shot which will be completed next office visit per his request. All other immunizations are up-to-date per recommendations. All screenings are up-to-date per recommendations. Obtain CBC, CMET, HIV, PSA, Lipid profile and TSH.   Overall well exam. Patient has risk factors including hypertension, diabetes, smoking, and diabetes. Hypertension is slightly elevated today and will start lisinopril. Continue to monitor blood pressure at home. Diabetes is uncontrolled at this time and see under plan for diabetes diagnosis. He is currently interested in smoking cessation - note plan under smoking cessation counseling. He is obese according to his BMI. Recommend increasing  nutrient density was diet and decreasing saturated fats. 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 Increase physical activity independent of work to 30 minutes most days of the week as tolerated. Continue other healthy lifestyle choices and behaviors. Follow up pending blood work; follow-up prevention exam in one year.      Relevant Orders   Lipid Profile   Comp Met (CMET)   CBC   PSA   TSH   HIV antibody   Encounter for smoking cessation counseling    Discussed methods of smoking cessation. Patient is interested in quitting smoking and actively seeking information. Discussed risks and benefits of patches, gums, and medication. Information was provided to patient and after visit summary. Patient will research his options and contact the office with his decision.

## 2014-09-21 NOTE — Patient Instructions (Addendum)
Thank you for choosing [facility].  Summary/Instructions:  Your prescription(s) have been submitted to your pharmacy or been printed and provided for you. Please take as directed and contact our office if you believe you are having problem(s) with the medication(s) or have any questions.  Please stop by the lab on the basement level of the building for your blood work. Your results will be released to [place] (or called to you) after review, usually within 72 hours after test completion. If any changes need to be made, you will be notified at that same time.  If your symptoms worsen or fail to improve, please contact our office for further instruction, or in case of emergency go directly to the emergency room at the closest medical facility.   Health Maintenance A healthy lifestyle and preventative care can promote health and wellness.  Maintain regular health, dental, and eye exams.  Eat a healthy diet. Foods like vegetables, fruits, whole grains, low-fat dairy products, and lean protein foods contain the nutrients you need and are low in calories. Decrease your intake of foods high in solid fats, added sugars, and salt. Get information about a proper diet from your health care provider, if necessary.  Regular physical exercise is one of the most important things you can do for your health. Most adults should get at least 150 minutes of moderate-intensity exercise (any activity that increases your heart rate and causes you to sweat) each week. In addition, most adults need muscle-strengthening exercises on 2 or more days a week.   Maintain a healthy weight. The body mass index (BMI) is a screening tool to identify possible weight problems. It provides an estimate of body fat based on height and weight. Your health care provider can find your BMI and can help you achieve or maintain a healthy weight. For males 20 years and older:  A BMI below 18.5 is considered underweight.  A BMI of  18.5 to 24.9 is normal.  A BMI of 25 to 29.9 is considered overweight.  A BMI of 30 and above is considered obese.  Maintain normal blood lipids and cholesterol by exercising and minimizing your intake of saturated fat. Eat a balanced diet with plenty of fruits and vegetables. Blood tests for lipids and cholesterol should begin at age 15 and be repeated every 5 years. If your lipid or cholesterol levels are high, you are over age 79, or you are at high risk for heart disease, you may need your cholesterol levels checked more frequently.Ongoing high lipid and cholesterol levels should be treated with medicines if diet and exercise are not working.  If you smoke, find out from your health care provider how to quit. If you do not use tobacco, do not start.  Lung cancer screening is recommended for adults aged 49-80 years who are at high risk for developing lung cancer because of a history of smoking. A yearly low-dose CT scan of the lungs is recommended for people who have at least a 30-pack-year history of smoking and are current smokers or have quit within the past 15 years. A pack year of smoking is smoking an average of 1 pack of cigarettes a day for 1 year (for example, a 30-pack-year history of smoking could mean smoking 1 pack a day for 30 years or 2 packs a day for 15 years). Yearly screening should continue until the smoker has stopped smoking for at least 15 years. Yearly screening should be stopped for people who develop a health  problem that would prevent them from having lung cancer treatment.  If you choose to drink alcohol, do not have more than 2 drinks per day. One drink is considered to be 12 oz (360 mL) of beer, 5 oz (150 mL) of wine, or 1.5 oz (45 mL) of liquor.  Avoid the use of street drugs. Do not share needles with anyone. Ask for help if you need support or instructions about stopping the use of drugs.  High blood pressure causes heart disease and increases the risk of stroke.  Blood pressure should be checked at least every 1-2 years. Ongoing high blood pressure should be treated with medicines if weight loss and exercise are not effective.  If you are 64-62 years old, ask your health care provider if you should take aspirin to prevent heart disease.  Diabetes screening involves taking a blood sample to check your fasting blood sugar level. This should be done once every 3 years after age 72 if you are at a normal weight and without risk factors for diabetes. Testing should be considered at a younger age or be carried out more frequently if you are overweight and have at least 1 risk factor for diabetes.  Colorectal cancer can be detected and often prevented. Most routine colorectal cancer screening begins at the age of 95 and continues through age 82. However, your health care provider may recommend screening at an earlier age if you have risk factors for colon cancer. On a yearly basis, your health care provider may provide home test kits to check for hidden blood in the stool. A small camera at the end of a tube may be used to directly examine the colon (sigmoidoscopy or colonoscopy) to detect the earliest forms of colorectal cancer. Talk to your health care provider about this at age 66 when routine screening begins. A direct exam of the colon should be repeated every 5-10 years through age 33, unless early forms of precancerous polyps or small growths are found.  People who are at an increased risk for hepatitis B should be screened for this virus. You are considered at high risk for hepatitis B if:  You were born in a country where hepatitis B occurs often. Talk with your health care provider about which countries are considered high risk.  Your parents were born in a high-risk country and you have not received a shot to protect against hepatitis B (hepatitis B vaccine).  You have HIV or AIDS.  You use needles to inject street drugs.  You live with, or have sex  with, someone who has hepatitis B.  You are a man who has sex with other men (MSM).  You get hemodialysis treatment.  You take certain medicines for conditions like cancer, organ transplantation, and autoimmune conditions.  Hepatitis C blood testing is recommended for all people born from [birth year] through 1966 and any individual with known risk factors for hepatitis C.  Healthy men should no longer receive prostate-specific antigen (PSA) blood tests as part of routine cancer screening. Talk to your health care provider about prostate cancer screening.  Testicular cancer screening is not recommended for adolescents or adult males who have no symptoms. Screening includes self-exam, a health care provider exam, and other screening tests. Consult with your health care provider about any symptoms you have or any concerns you have about testicular cancer.  Practice safe sex. Use condoms and avoid high-risk sexual practices to reduce the spread of sexually transmitted infections (STIs).  You should  be screened for STIs, including gonorrhea and chlamydia if:  You are sexually active and are younger than 24 years.  You are older than 24 years, and your health care provider tells you that you are at risk for this type of infection.  Your sexual activity has changed since you were last screened, and you are at an increased risk for chlamydia or gonorrhea. Ask your health care provider if you are at risk.  If you are at risk of being infected with HIV, it is recommended that you take a prescription medicine daily to prevent HIV infection. This is called pre-exposure prophylaxis (PrEP). You are considered at risk if:  You are a man who has sex with other men (MSM).  You are a heterosexual man who is sexually active with multiple partners.  You take drugs by injection.  You are sexually active with a partner who has HIV.  Talk with your health care provider about whether you are at high risk of being  infected with HIV. If you choose to begin PrEP, you should first be tested for HIV. You should then be tested every 3 months for as long as you are taking PrEP.  Use sunscreen. Apply sunscreen liberally and repeatedly throughout the day. You should seek shade when your shadow is shorter than you. Protect yourself by wearing long sleeves, pants, a wide-brimmed hat, and sunglasses year round whenever you are outdoors.  Tell your health care provider of new moles or changes in moles, especially if there is a change in shape or color. Also, tell your health care provider if a mole is larger than the size of a pencil eraser.  A one-time screening for abdominal aortic aneurysm (AAA) and surgical repair of large AAAs by ultrasound is recommended for men aged 46-75 years who are current or former smokers.  Stay current with your vaccines (immunizations). Document Released: 09/20/2007 Document Revised: 03/29/2013 Document Reviewed: 08/19/2010 [facility] Patient Information 2015 [place], [place]. This information is not intended to replace advice given to you by your health care provider. Make sure you discuss any questions you have with your health care provider.   Smoking Cessation Quitting smoking is important to your health and has many advantages. However, it is not always easy to quit since nicotine is a very addictive drug. Oftentimes, people try 3 times or more before being able to quit. This document explains the best ways for you to prepare to quit smoking. Quitting takes hard work and a lot of effort, but you can do it. ADVANTAGES OF QUITTING SMOKING  You will live longer, feel better, and live better.  Your body will feel the impact of quitting smoking almost immediately.  Within 20 minutes, blood pressure decreases. Your pulse returns to its normal level.  After 8 hours, carbon monoxide levels in the blood return to normal. Your oxygen level increases.  After 24 hours, the chance of having a  heart attack starts to decrease. Your breath, hair, and body stop smelling like smoke.  After 48 hours, damaged nerve endings begin to recover. Your sense of taste and smell improve.  After 72 hours, the body is virtually free of nicotine. Your bronchial tubes relax and breathing becomes easier.  After 2 to 12 weeks, lungs can hold more air. Exercise becomes easier and circulation improves.  The risk of having a heart attack, stroke, cancer, or lung disease is greatly reduced.  After 1 year, the risk of coronary heart disease is cut in half.  After 5 years, the risk of stroke falls to the same as a nonsmoker.  After 10 years, the risk of lung cancer is cut in half and the risk of other cancers decreases significantly.  After 15 years, the risk of coronary heart disease drops, usually to the level of a nonsmoker.  If you are pregnant, quitting smoking will improve your chances of having a healthy baby.  The people you live with, especially any children, will be healthier.  You will have extra money to spend on things other than cigarettes. QUESTIONS TO THINK ABOUT BEFORE ATTEMPTING TO QUIT You may want to talk about your answers with your health care provider.  Why do you want to quit?  If you tried to quit in the past, what helped and what did not?  What will be the most difficult situations for you after you quit? How will you plan to handle them?  Who can help you through the tough times? Your family? Friends? A health care provider?  What pleasures do you get from smoking? What ways can you still get pleasure if you quit? Here are some questions to ask your health care provider:  How can you help me to be successful at quitting?  What medicine do you think would be best for me and how should I take it?  What should I do if I need more help?  What is smoking withdrawal like? How can I get information on withdrawal? GET READY  Set a quit date.  Change your environment  by getting rid of all cigarettes, ashtrays, matches, and lighters in your home, car, or work. Do not let people smoke in your home.  Review your past attempts to quit. Think about what worked and what did not. GET SUPPORT AND ENCOURAGEMENT You have a better chance of being successful if you have help. You can get support in many ways.  Tell your family, friends, and coworkers that you are going to quit and need their support. Ask them not to smoke around you.  Get individual, group, or telephone counseling and support. Programs are available at [employer] and health centers. Call your local health department for information about programs in your area.  Spiritual beliefs and practices may help some smokers quit.  Download a "quit meter" on your computer to keep track of quit statistics, such as how long you have gone without smoking, cigarettes not smoked, and money saved.  Get a self-help book about quitting smoking and staying off tobacco. [place] yourself from urges to smoke. Talk to someone, go for a walk, or occupy your time with a task.  Change your normal routine. Take a different route to work. Drink tea instead of coffee. Eat breakfast in a different place.  Reduce your stress. Take a hot bath, exercise, or read a book.  Plan something enjoyable to do every day. Reward yourself for not smoking.  Explore interactive web-based programs that specialize in helping you quit. GET MEDICINE AND USE IT CORRECTLY Medicines can help you stop smoking and decrease the urge to smoke. Combining medicine with the above behavioral methods and support can greatly increase your chances of successfully quitting smoking.  Nicotine replacement therapy helps deliver nicotine to your body without the negative effects and risks of smoking. Nicotine replacement therapy includes nicotine gum, lozenges, inhalers, nasal sprays, and skin patches. Some may be available  over-the-counter and others require a prescription.  Antidepressant medicine helps people abstain  from smoking, but how this works is unknown. This medicine is available by prescription.  Nicotinic receptor partial agonist medicine simulates the effect of nicotine in your brain. This medicine is available by prescription. Ask your health care provider for advice about which medicines to use and how to use them based on your health history. Your health care provider will tell you what side effects to look out for if you choose to be on a medicine or therapy. Carefully read the information on the package. Do not use any other product containing nicotine while using a nicotine replacement product.  RELAPSE OR DIFFICULT SITUATIONS Most relapses occur within the first 3 months after quitting. Do not be discouraged if you start smoking again. Remember, most people try several times before finally quitting. You may have symptoms of withdrawal because your body is used to nicotine. You may crave cigarettes, be irritable, feel very hungry, cough often, get headaches, or have difficulty concentrating. The withdrawal symptoms are only temporary. They are strongest when you first quit, but they will go away within 10-14 days. To reduce the chances of relapse, try to:  Avoid drinking alcohol. Drinking lowers your chances of successfully quitting.  Reduce the amount of caffeine you consume. Once you quit smoking, the amount of caffeine in your body increases and can give you symptoms, such as a rapid heartbeat, sweating, and anxiety.  Avoid smokers because they can make you want to smoke.  Do not let weight gain distract you. Many smokers will gain weight when they quit, usually less than 10 pounds. Eat a healthy diet and stay active. You can always lose the weight gained after you quit.  Find ways to improve your mood other than smoking. FOR MORE INFORMATION  www.smokefree.gov  Document Released: 03/18/2001  Document Revised: 08/08/2013 Document Reviewed: 07/03/2011 [facility] Patient Information 2015 [place], [place]. This information is not intended to replace advice given to you by your health care provider. Make sure you discuss any questions you have with your health care provider.  Varenicline oral tablets What is this medicine? VARENICLINE (var EN i kleen) is used to help people quit smoking. It can reduce the symptoms caused by stopping smoking. It is used with a patient support program recommended by your physician. This medicine may be used for other purposes; ask your health care provider or pharmacist if you have questions. COMMON BRAND NAME(S): Chantix What should I tell my health care provider before I take this medicine? They need to know if you have any of these conditions: -bipolar disorder, depression, schizophrenia or other mental illness -heart disease -if you often drink alcohol -kidney disease -peripheral vascular disease -seizures -stroke -suicidal thoughts, plans, or attempt; a previous suicide attempt by you or a family member -an unusual or allergic reaction to varenicline, other medicines, foods, dyes, or preservatives -pregnant or trying to get pregnant -breast-feeding How should I use this medicine? You should set a date to stop smoking and tell your doctor. Start this medicine one week before the quit date. You can also start taking this medicine before you choose a quit date, and then pick a quit date that is between 8 and 35 days of treatment with this medicine. Stick to your plan; ask about support groups or other ways to help you remain a 'quitter'. Take this medicine by mouth after eating. Take with a full glass of water. Follow the directions on the prescription label. Take your doses at regular intervals. Do not take your medicine more  often than directed. A special MedGuide will be given to you by the pharmacist with each prescription and refill. Be sure to read  this information carefully each time. Talk to your pediatrician regarding the use of this medicine in children. This medicine is not approved for use in children. Overdosage: If you think you have taken too much of this medicine contact a poison control center or emergency room at once. NOTE: This medicine is only for you. Do not share this medicine with others. What if I miss a dose? If you miss a dose, take it as soon as you can. If it is almost time for your next dose, take only that dose. Do not take double or extra doses. What may interact with this medicine? -alcohol or any product that contains alcohol -insulin -other stop smoking aids -theophylline -warfarin This list may not describe all possible interactions. Give your health care provider a list of all the medicines, herbs, non-prescription drugs, or dietary supplements you use. Also tell them if you smoke, drink alcohol, or use illegal drugs. Some items may interact with your medicine. What should I watch for while using this medicine? Visit your doctor or health care professional for regular check ups. Ask for ongoing advice and encouragement from your doctor or healthcare professional, friends, and family to help you quit. If you smoke while on this medication, quit again Your mouth may get dry. Chewing sugarless gum or sucking hard candy, and drinking plenty of water may help. Contact your doctor if the problem does not go away or is severe. You may get drowsy or dizzy. Do not drive, use machinery, or do anything that needs mental alertness until you know how this medicine affects you. Do not stand or sit up quickly, especially if you are an older patient. This reduces the risk of dizzy or fainting spells. The use of this medicine may increase the chance of suicidal thoughts or actions. Pay special attention to how you are responding while on this medicine. Any worsening of mood, or thoughts of suicide or dying should be reported to  your health care professional right away. What side effects may I notice from receiving this medicine? Side effects that you should report to your doctor or health care professional as soon as possible: -allergic reactions like skin rash, itching or hives, swelling of the face, lips, tongue, or throat -breathing problems -changes in vision -chest pain or chest tightness -confusion, trouble speaking or understanding -fast, irregular heartbeat -feeling faint or lightheaded, falls -fever -pain in legs when walking -problems with balance, talking, walking -redness, blistering, peeling or loosening of the skin, including inside the mouth -ringing in ears -seizures -sudden numbness or weakness of the face, arm or leg -suicidal thoughts or other mood changes -trouble passing urine or change in the amount of urine -unusual bleeding or bruising -unusually weak or tired Side effects that usually do not require medical attention (report to your doctor or health care professional if they continue or are bothersome): -constipation -headache -nausea, vomiting -strange dreams -stomach gas -trouble sleeping This list may not describe all possible side effects. Call your doctor for medical advice about side effects. You may report side effects to FDA at 1-800-FDA-1088. Where should I keep my medicine? Keep out of the reach of children. Store at room temperature between 15 and 30 degrees C (59 and 86 degrees F). Throw away any unused medicine after the expiration date. NOTE: This sheet is a summary. It may not cover  all possible information. If you have questions about this medicine, talk to your doctor, pharmacist, or health care provider.  2015, [employer]. (2013-01-03 13:37:47) Bupropion sustained-release tablets (smoking cessation) What is this medicine? BUPROPION (byoo PROE pee on) is used to help people quit smoking. This medicine may be used for other purposes; ask your health  care provider or pharmacist if you have questions. COMMON BRAND NAME(S): Buproban, Zyban What should I tell my health care provider before I take this medicine? They need to know if you have any of these conditions: -an eating disorder, such as anorexia or bulimia -bipolar disorder or psychosis -diabetes or high blood sugar, treated with medication -glaucoma -head injury or brain tumor -heart disease, previous heart attack, or irregular heart beat -high blood pressure -kidney or liver disease -seizures -suicidal thoughts or a previous suicide attempt -Tourette's syndrome -weight loss -an unusual or allergic reaction to bupropion, other medicines, foods, dyes, or preservatives -breast-feeding -pregnant or trying to become pregnant How should I use this medicine? Take this medicine by mouth with a glass of water. Follow the directions on the prescription label. You can take it with or without food. If it upsets your stomach, take it with food. Do not cut, crush or chew this medicine. Take your medicine at regular intervals. If you take this medicine more than once a day, take your second dose at least 8 hours after you take your first dose. To limit difficulty in sleeping, avoid taking this medicine at bedtime. Do not take your medicine more often than directed. Do not stop taking this medicine suddenly except upon the advice of your doctor. Stopping this medicine too quickly may cause serious side effects. A special MedGuide will be given to you by the pharmacist with each prescription and refill. Be sure to read this information carefully each time. Talk to your pediatrician regarding the use of this medicine in children. Special care may be needed. Overdosage: If you think you have taken too much of this medicine contact a poison control center or emergency room at once. NOTE: This medicine is only for you. Do not share this medicine with others. What if I miss a dose? If you miss a dose,  skip the missed dose and take your next tablet at the regular time. There should be at least 8 hours between doses. Do not take double or extra doses. What may interact with this medicine? Do not take this medicine with any of the following medications: -linezolid -MAOIs like Azilect, Carbex, Eldepryl, Marplan, Nardil, and Parnate -methylene blue (injected into a vein) -other medicines that contain bupropion like Wellbutrin This medicine may also interact with the following medications: -alcohol -certain medicines for anxiety or sleep -certain medicines for blood pressure like metoprolol, propranolol -certain medicines for depression or psychotic disturbances -certain medicines for HIV or AIDS like efavirenz, lopinavir, nelfinavir, ritonavir -certain medicines for irregular heart beat like propafenone, flecainide -certain medicines for Parkinson's disease like amantadine, levodopa -certain medicines for seizures like carbamazepine, phenytoin, phenobarbital -cimetidine -clopidogrel -cyclophosphamide -furazolidone -isoniazid -nicotine -orphenadrine -procarbazine -steroid medicines like prednisone or cortisone -stimulant medicines for attention disorders, weight loss, or to stay awake -tamoxifen -theophylline -thiotepa -ticlopidine -tramadol -warfarin This list may not describe all possible interactions. Give your health care provider a list of all the medicines, herbs, non-prescription drugs, or dietary supplements you use. Also tell them if you smoke, drink alcohol, or use illegal drugs. Some items may interact with your medicine. What should I watch for while  using this medicine? Visit your doctor or health care professional for regular checks on your progress. This medicine should be used together with a patient support program. It is important to participate in a behavioral program, counseling, or other support program that is recommended by your health care  professional. Patients and their families should watch out for new or worsening thoughts of suicide or depression. Also watch out for sudden changes in feelings such as feeling anxious, agitated, panicky, irritable, hostile, aggressive, impulsive, severely restless, overly excited and hyperactive, or not being able to sleep. If this happens, especially at the beginning of treatment or after a change in dose, call your health care professional. Avoid alcoholic drinks while taking this medicine. Drinking excessive alcoholic beverages, using sleeping or anxiety medicines, or quickly stopping the use of these agents while taking this medicine may increase your risk for a seizure. Do not drive or use heavy machinery until you know how this medicine affects you. This medicine can impair your ability to perform these tasks. Do not take this medicine close to bedtime. It may prevent you from sleeping. Your mouth may get dry. Chewing sugarless gum or sucking hard candy, and drinking plenty of water may help. Contact your doctor if the problem does not go away or is severe. Do not use nicotine patches or chewing gum without the advice of your doctor or health care professional while taking this medicine. You may need to have your blood pressure taken regularly if your doctor recommends that you use both nicotine and this medicine together. What side effects may I notice from receiving this medicine? Side effects that you should report to your doctor or health care professional as soon as possible: -allergic reactions like skin rash, itching or hives, swelling of the face, lips, or tongue -breathing problems -changes in vision -confusion -fast or irregular heartbeat -hallucinations -increased blood pressure -redness, blistering, peeling or loosening of the skin, including inside the mouth -seizures -suicidal thoughts or other mood changes -unusually weak or tired -vomiting Side effects that usually do not  require medical attention (report to your doctor or health care professional if they continue or are bothersome): -change in sex drive or performance -constipation -headache -loss of appetite -nausea -tremors -weight loss This list may not describe all possible side effects. Call your doctor for medical advice about side effects. You may report side effects to FDA at 1-800-FDA-1088. Where should I keep my medicine? Keep out of the reach of children. Store at room temperature between 20 and 25 degrees C (68 and 77 degrees F). Protect from light. Keep container tightly closed. Throw away any unused medicine after the expiration date. NOTE: This sheet is a summary. It may not cover all possible information. If you have questions about this medicine, talk to your doctor, pharmacist, or health care provider.  2015, Elsevier/Gold Standard. (2012-11-19 10:55:10)

## 2014-09-22 DIAGNOSIS — E785 Hyperlipidemia, unspecified: Secondary | ICD-10-CM | POA: Insufficient documentation

## 2014-09-22 DIAGNOSIS — R972 Elevated prostate specific antigen [PSA]: Secondary | ICD-10-CM | POA: Insufficient documentation

## 2014-09-22 MED ORDER — ATORVASTATIN CALCIUM 40 MG PO TABS: 40.0000 mg | ORAL_TABLET | Freq: Every day | ORAL | Status: DC

## 2014-09-22 MED ORDER — GLIPIZIDE 10 MG PO TABS: 10.0000 mg | ORAL_TABLET | Freq: Every day | ORAL | Status: DC

## 2014-09-22 MED ORDER — GLUCOSE BLOOD VI STRP: ORAL_STRIP | Status: DC

## 2014-09-22 NOTE — Assessment & Plan Note (Signed)
Elevated PSA noted on screen. Refer to Urology for further evaluation.

## 2014-09-22 NOTE — Assessment & Plan Note (Signed)
A1c noted to be 10.5. Restart glipizide. Freestyle Precision strips sent to pharmacy. Continue to monitor blood sugars at home. Follow up in 1 month.

## 2014-09-22 NOTE — Telephone Encounter (Signed)
Please inform patient that his blood work shows that his thyroid function, kidney function, electrolytes and liver function are all within the normal limits. There are several areas that do require attention. The first is his diabetes. As anticipated, his A1c is elevated at 10.5 indicating that his diabetes is poorly controlled. Therefore I will have him restart his glipizide at minium as it may require multiple medications to bring his blood sugars below the goal of <7.0 for his A1c and a morning reading of 80-130. I have also sent in his test strips to [facility] at his request.  The second is his cholesterol, which is HDL or good cholesterol is low which increases his risk for cardiovascular disease and based on current guidelines it is recommended to start therapy with a cholesterol medication. Therefore I am recommending he start with atorvastatin daily. Please have him notify us if he develops any abnormal muscle pain after starting the medication. He can also increase his HDL with diet by increasing intake of omega-3 fatty acids like olive oil, fish, walnuts, flax seeds, and fish oil supplements. Also to note in his cholesterol his triglycerides are high at >200 with a goal of <150.  Finally for his prostate, his PSA was elevated and I am going to send him to urology for further evaluation to rule out prostate cancer. He should be hearing back from our office in a couple of weeks regarding this appointment. Please have him follow up in 1 month or sooner if needed.

## 2014-09-22 NOTE — Telephone Encounter (Signed)
Pt aware of results 

## 2014-09-22 NOTE — Assessment & Plan Note (Signed)
Global CHD risk factor calculated greater than 40%. Per 2013 Guidelines start atorvastatin 40 mg daily. Dietary changes to increase HDL. If no improvement consider addition of Lovaza.

## 2014-09-23 LAB — HIV ANTIBODY (ROUTINE TESTING W REFLEX): HIV 1&2 Ab, 4th Generation: NONREACTIVE

## 2014-09-25 ENCOUNTER — Telehealth: Payer: Self-pay | Admitting: Family

## 2014-09-25 DIAGNOSIS — IMO0002 Reserved for concepts with insufficient information to code with codable children: Secondary | ICD-10-CM

## 2014-09-25 DIAGNOSIS — E1165 Type 2 diabetes mellitus with hyperglycemia: Secondary | ICD-10-CM

## 2014-09-25 MED ORDER — GLUCOSE BLOOD VI STRP: ORAL_STRIP | Status: DC

## 2014-09-25 NOTE — Telephone Encounter (Signed)
Rx sent 

## 2014-09-25 NOTE — Telephone Encounter (Signed)
Please notify patient that his HIV was negative.

## 2014-09-25 NOTE — Telephone Encounter (Signed)
Advised of the below...  Patient states that glucose blood test strip [[id number]]  Are not covered @ [facility],. He requests that [facility] [address].

## 2014-09-27 MED ORDER — GLUCOSE BLOOD VI STRP: ORAL_STRIP | Status: DC

## 2014-09-27 NOTE — Telephone Encounter (Signed)
Rx resent.

## 2014-09-27 NOTE — Telephone Encounter (Signed)
Pt called in and said that [facility] does not have his Test strips.  Can they be resent?

## 2014-09-27 NOTE — Addendum Note (Signed)
Addended by: Delice Bison E on: 09/27/2014 11:30 AM   Modules accepted: Orders

## 2014-09-29 ENCOUNTER — Encounter: Payer: Self-pay | Admitting: Family

## 2014-09-29 ENCOUNTER — Telehealth: Payer: Self-pay | Admitting: Family

## 2014-09-29 MED ORDER — ONETOUCH ULTRA MINI W/DEVICE KIT: 1.0000 | PACK | Freq: Once | Status: DC

## 2014-09-29 NOTE — Telephone Encounter (Signed)
OneTouch mini prescription sent to pharmacy.

## 2014-09-29 NOTE — Telephone Encounter (Signed)
Patient states insurance will no longer cover testing supplies for current meter.  Patient is requesting One Touch Ultra Mini.

## 2014-09-29 NOTE — Telephone Encounter (Signed)
Pt aware.

## 2014-09-29 NOTE — Telephone Encounter (Signed)
Notified patient.

## 2014-10-18 ENCOUNTER — Other Ambulatory Visit: Payer: Self-pay | Admitting: Family

## 2014-10-23 ENCOUNTER — Encounter: Payer: Self-pay | Admitting: Family

## 2014-10-23 ENCOUNTER — Ambulatory Visit (INDEPENDENT_AMBULATORY_CARE_PROVIDER_SITE_OTHER): Payer: BLUE CROSS/BLUE SHIELD | Admitting: Family

## 2014-10-23 VITALS — BP 142/72 | HR 85 | Temp 98.2°F | Resp 18 | Ht 71.0 in | Wt 221.0 lb

## 2014-10-23 DIAGNOSIS — E1165 Type 2 diabetes mellitus with hyperglycemia: Secondary | ICD-10-CM

## 2014-10-23 DIAGNOSIS — N521 Erectile dysfunction due to diseases classified elsewhere: Secondary | ICD-10-CM | POA: Diagnosis not present

## 2014-10-23 DIAGNOSIS — E1169 Type 2 diabetes mellitus with other specified complication: Secondary | ICD-10-CM | POA: Insufficient documentation

## 2014-10-23 DIAGNOSIS — IMO0002 Reserved for concepts with insufficient information to code with codable children: Secondary | ICD-10-CM

## 2014-10-23 MED ORDER — SILDENAFIL CITRATE 100 MG PO TABS: 50.0000 mg | ORAL_TABLET | Freq: Every day | ORAL | Status: DC | PRN

## 2014-10-23 NOTE — Progress Notes (Signed)
Pre visit review using our clinic review tool, if applicable. No additional management support is needed unless otherwise documented below in the visit note. 

## 2014-10-23 NOTE — Assessment & Plan Note (Signed)
Previous A1c of 10.5 with uncontrolled diabetes. Currently maintained on glipizide. Home blood sugar readings are improved and ranging between 120 and 150. Continue current dosage of glipizide. Follow-up A1c in 2 months.

## 2014-10-23 NOTE — Patient Instructions (Signed)
Thank you for choosing [facility].  Summary/Instructions:  Please continue to check your blood sugars and take your medication as prescribed.   Follow up in 2 months for an A1c check.  Your prescription(s) have been submitted to your pharmacy or been printed and provided for you. Please take as directed and contact our office if you believe you are having problem(s) with the medication(s) or have any questions.  If your symptoms worsen or fail to improve, please contact our office for further instruction, or in case of emergency go directly to the emergency room at the closest medical facility.   Erectile Dysfunction Erectile dysfunction is the inability to get or sustain a good enough erection to have sexual intercourse. Erectile dysfunction may involve:  Inability to get an erection.  Lack of enough hardness to allow penetration.  Loss of the erection before sex is finished.  Premature ejaculation. CAUSES  Certain drugs, such as:  Pain relievers.  Antihistamines.  Antidepressants.  Blood pressure medicines.  Water pills (diuretics).  Ulcer medicines.  Muscle relaxants.  Illegal drugs.  Excessive drinking.  Psychological causes, such as:  Anxiety.  Depression.  Sadness.  Exhaustion.  Performance fear.  Stress.  Physical causes, such as:  Artery problems. This may include diabetes, smoking, liver disease, or atherosclerosis.  High blood pressure.  Hormonal problems, such as low testosterone.  Obesity.  Nerve problems. This may include back or pelvic injuries, diabetes mellitus, multiple sclerosis, or Parkinson disease. SYMPTOMS  Inability to get an erection.  Lack of enough hardness to allow penetration.  Loss of the erection before sex is finished.  Premature ejaculation.  Normal erections at some times, but with frequent unsatisfactory episodes.  Orgasms that are not satisfactory in sensation or frequency.  Low sexual  satisfaction in either partner because of erection problems.  A curved penis occurring with erection. The curve may cause pain or may be too curved to allow for intercourse.  Never having nighttime erections. DIAGNOSIS Your caregiver can often diagnose this condition by:  Performing a physical exam to find other diseases or specific problems with the penis.  Asking you detailed questions about the problem.  Performing blood tests to check for diabetes mellitus or to measure hormone levels.  Performing urine tests to find other underlying health conditions.  Performing an ultrasound exam to check for scarring.  Performing a test to check blood flow to the penis.  Doing a sleep study at home to measure nighttime erections. TREATMENT   You may be prescribed medicines by mouth.  You may be given medicine injections into the penis.  You may be prescribed a vacuum pump with a ring.  Penile implant surgery may be performed. You may receive:  An inflatable implant.  A semirigid implant.  Blood vessel surgery may be performed. HOME CARE INSTRUCTIONS  If you are prescribed oral medicine, you should take the medicine as prescribed. Do not increase the dosage without first discussing it with your physician.  If you are using self-injections, be careful to avoid any veins that are on the surface of the penis. Apply pressure to the injection site for 5 minutes.  If you are using a vacuum pump, make sure you have read the instructions before using it. Discuss any questions with your physician before taking the pump home. SEEK MEDICAL CARE IF:  You experience pain that is not responsive to the pain medicine you have been prescribed.  You experience nausea or vomiting. SEEK IMMEDIATE MEDICAL CARE IF:  When taking oral or injectable medications, you experience an erection that lasts longer than 4 hours. If your physician is unavailable, go to the nearest emergency room for  evaluation. An erection that lasts much longer than 4 hours can result in permanent damage to your penis.  You have pain that is severe.  You develop redness, severe pain, or severe swelling of your penis.  You have redness spreading up into your groin or lower abdomen.  You are unable to pass your urine. Document Released: 03/21/2000 Document Revised: 11/24/2012 Document Reviewed: 08/26/2012 [facility] Patient Information 2015 [place], [place]. This information is not intended to replace advice given to you by your health care provider. Make sure you discuss any questions you have with your health care provider.  Sildenafil tablets (Viagra) What is this medicine? SILDENAFIL (sil DEN a fil) is used to treat erection problems in men. This medicine may be used for other purposes; ask your health care provider or pharmacist if you have questions. COMMON BRAND NAME(S): Viagra What should I tell my health care provider before I take this medicine? They need to know if you have any of these conditions: -bleeding disorders -eye or vision problems, including a rare inherited eye disease called retinitis pigmentosa -anatomical deformation of the penis, Peyronie's disease, or history of priapism (painful and prolonged erection) -heart disease, angina, a history of heart attack, irregular heart beats, or other heart problems -high or low blood pressure -history of blood diseases, like sickle cell anemia or leukemia -history of stomach bleeding -kidney disease -liver disease -stroke -an unusual or allergic reaction to sildenafil, other medicines, foods, dyes, or preservatives -pregnant or trying to get pregnant -breast-feeding How should I use this medicine? Take this medicine by mouth with a glass of water. Follow the directions on the prescription label. The dose is usually taken 1 hour before sexual activity. You should not take the dose more than once per day. Do not take your medicine more  often than directed. Talk to your pediatrician regarding the use of this medicine in children. This medicine is not used in children for this condition. Overdosage: If you think you have taken too much of this medicine contact a poison control center or emergency room at once. NOTE: This medicine is only for you. Do not share this medicine with others. What if I miss a dose? This does not apply. Do not take double or extra doses. What may interact with this medicine? Do not take this medicine with any of the following medications: -cisapride -methscopolamine nitrate -nitrates like amyl nitrite, isosorbide dinitrate, isosorbide mononitrate, nitroglycerin -nitroprusside -other medicines for erectile dysfunction like avanafil, tadalafil, vardenafil -other sildenafil products (Revatio) This medicine may also interact with the following medications: -certain drugs for high blood pressure -certain drugs for the treatment of HIV infection or AIDS -certain drugs used for fungal or yeast infections, like fluconazole, itraconazole, ketoconazole, and voriconazole -cimetidine -erythromycin -rifampin This list may not describe all possible interactions. Give your health care provider a list of all the medicines, herbs, non-prescription drugs, or dietary supplements you use. Also tell them if you smoke, drink alcohol, or use illegal drugs. Some items may interact with your medicine. What should I watch for while using this medicine? If you notice any changes in your vision while taking this drug, call your doctor or health care professional as soon as possible. Stop using this medicine and call your health care provider right away if you have a loss of sight in one  or both eyes. Contact your doctor or health care professional right away if you have an erection that lasts longer than 4 hours or if it becomes painful. This may be a sign of a serious problem and must be treated right away to prevent permanent  damage. If you experience symptoms of nausea, dizziness, chest pain or arm pain upon initiation of sexual activity after taking this medicine, you should refrain from further activity and call your doctor or health care professional as soon as possible. Do not drink alcohol to excess (examples, 5 glasses of wine or 5 shots of whiskey) when taking this medicine. When taken in excess, alcohol can increase your chances of getting a headache or getting dizzy, increasing your heart rate or lowering your blood pressure. Using this medicine does not protect you or your partner against HIV infection (the virus that causes AIDS) or other sexually transmitted diseases. What side effects may I notice from receiving this medicine? Side effects that you should report to your doctor or health care professional as soon as possible: -allergic reactions like skin rash, itching or hives, swelling of the face, lips, or tongue -breathing problems -changes in hearing -changes in vision -chest pain -fast, irregular heartbeat -prolonged or painful erection -seizures Side effects that usually do not require medical attention (report to your doctor or health care professional if they continue or are bothersome): -back pain -dizziness -flushing -headache -indigestion -muscle aches -nausea -stuffy or runny nose This list may not describe all possible side effects. Call your doctor for medical advice about side effects. You may report side effects to FDA at 1-800-FDA-1088. Where should I keep my medicine? Keep out of reach of children. Store at room temperature between 15 and 30 degrees C (59 and 86 degrees F). Throw away any unused medicine after the expiration date. NOTE: This sheet is a summary. It may not cover all possible information. If you have questions about this medicine, talk to your doctor, pharmacist, or health care provider.  2015, Elsevier/Gold Standard. (2012-03-24 12:43:54)

## 2014-10-23 NOTE — Progress Notes (Signed)
Subjective:    Patient ID: Nathaniel Waters, male    DOB: 1955/08/04, 59 y.o.   MRN: [id number]  Chief Complaint  Patient presents with  . Follow-up    diabetes and cholesterol check, sugars have came down and medicaiton has been working well, not fasting    HPI:  Nathaniel Waters is a 59 y.o. male with a PMH of chest pain, dysphagia, elevated PSA, GERD, and type 2 diabetes who presents today for a follow-up office visit.    1.) Type 2 Diabetes - Currently maintained on glipizide. Reports that he takes the medication as prescribed. Notes that his home blood sugar has come down and is working well. Denies any hypoglycemic events. Indicates he completed his eye exam.    Lab Results  Component Value Date   HGBA1C 10.5* 09/21/2014    2.) Erectile dysfunction - this is a new problem. Associated symptom of erectile dysfunction has been gong on for several months since being on medication. Describes that he is currently able to obtain an erection, however the strength of it is noted to be weak. Denies any modifying factors that make it better.   No Known Allergies  Current Outpatient Prescriptions on File Prior to Visit  Medication Sig Dispense Refill  . atorvastatin (LIPITOR) 40 MG tablet Take 1 tablet (40 mg total) by mouth daily. 30 tablet 1  . Blood Glucose Monitoring Suppl (ONE TOUCH ULTRA MINI) W/DEVICE KIT 1 Device by Does not apply route once. 1 each 0  . glipiZIDE (GLUCOTROL) 10 MG tablet TAKE ONE TABLET BY MOUTH ONCE DAILY BEFORE BREAKFAST 30 tablet 0  . glucose blood test strip Use as instructed 100 each 12  . glucose blood test strip Use as instructed 100 each 12  . lansoprazole (PREVACID) 15 MG capsule Take 15 mg by mouth daily.    Marland Kitchen lisinopril (PRINIVIL,ZESTRIL) 10 MG tablet TAKE ONE TABLET BY MOUTH DAILY 30 tablet 0   No current facility-administered medications on file prior to visit.    Review of Systems  Eyes:       Negative for changes in vision.   Respiratory: Negative  for chest tightness and shortness of breath.   Cardiovascular: Negative for chest pain, palpitations and leg swelling.  Endocrine: Negative for polydipsia, polyphagia and polyuria.      Objective:    BP 142/72 mmHg  Pulse 85  Temp(Src) 98.2 F (36.8 C) (Oral)  Resp 18  Ht _0  (1.803 m)  Wt 221 lb (100.245 kg)  BMI 30.84 kg/m2  SpO2 96% Nursing note and vital signs reviewed.  Physical Exam  Constitutional: He is oriented to person, place, and time. He appears well-developed and well-nourished. No distress.  Cardiovascular: Normal rate, regular rhythm, normal heart sounds and intact distal pulses.   Pulmonary/Chest: Effort normal and breath sounds normal.  Neurological: He is alert and oriented to person, place, and time.  Skin: Skin is warm and dry.  Psychiatric: He has a normal mood and affect. His behavior is normal. Judgment and thought content normal.       Assessment & Plan:   Problem List Items Addressed This Visit      Endocrine   Erectile dysfunction associated with type 2 diabetes mellitus    Erectile dysfunction associated type 2 diabetes and hypertension. Start Viagra as needed. Discussed risks and benefits of medication and when to seek immediate care. Follow up after several uses to determine effectiveness.      Relevant Medications   sildenafil (  VIAGRA) 100 MG tablet     Other   Type II diabetes mellitus, uncontrolled - Primary    Previous A1c of 10.5 with uncontrolled diabetes. Currently maintained on glipizide. Home blood sugar readings are improved and ranging between 120 and 150. Continue current dosage of glipizide. Follow-up A1c in 2 months.         l

## 2014-10-23 NOTE — Assessment & Plan Note (Signed)
Erectile dysfunction associated type 2 diabetes and hypertension. Start Viagra as needed. Discussed risks and benefits of medication and when to seek immediate care. Follow up after several uses to determine effectiveness.

## 2014-10-24 ENCOUNTER — Telehealth: Payer: Self-pay | Admitting: Family

## 2014-10-24 MED ORDER — TADALAFIL 10 MG PO TABS: 10.0000 mg | ORAL_TABLET | Freq: Every day | ORAL | Status: DC | PRN

## 2014-10-24 NOTE — Telephone Encounter (Signed)
Patient called to advise that sildenafil (VIAGRA) 100 MG tablet [[id number]] rx is far too expensive ($400). He is requesting something else that might be cheaper.

## 2014-10-24 NOTE — Telephone Encounter (Signed)
Cialis sent to pharmacy. If this is too expensive may have to send to [facility] [place] [facility].

## 2014-10-25 NOTE — Telephone Encounter (Signed)
Pt is going to check price difference with [facility].

## 2014-10-26 NOTE — Telephone Encounter (Signed)
Patient would like cialis to be sent to [facility].  Also would like to know if there is a discount card he can get.

## 2014-10-30 MED ORDER — TADALAFIL 10 MG PO TABS: 10.0000 mg | ORAL_TABLET | Freq: Every day | ORAL | Status: DC | PRN

## 2014-10-30 NOTE — Addendum Note (Signed)
Addended by: Mauricio Po D on: 10/30/2014 08:50 AM   Modules accepted: Orders

## 2014-10-30 NOTE — Telephone Encounter (Signed)
Completed.

## 2014-11-16 ENCOUNTER — Other Ambulatory Visit: Payer: Self-pay | Admitting: Family

## 2014-12-18 ENCOUNTER — Other Ambulatory Visit: Payer: Self-pay | Admitting: Family

## 2014-12-25 ENCOUNTER — Other Ambulatory Visit (INDEPENDENT_AMBULATORY_CARE_PROVIDER_SITE_OTHER): Payer: BLUE CROSS/BLUE SHIELD

## 2014-12-25 ENCOUNTER — Encounter: Payer: Self-pay | Admitting: Family

## 2014-12-25 ENCOUNTER — Telehealth: Payer: Self-pay | Admitting: Family

## 2014-12-25 ENCOUNTER — Ambulatory Visit (INDEPENDENT_AMBULATORY_CARE_PROVIDER_SITE_OTHER): Payer: BLUE CROSS/BLUE SHIELD | Admitting: Family

## 2014-12-25 VITALS — BP 136/84 | HR 87 | Temp 98.6°F | Resp 18 | Ht 71.0 in | Wt 228.0 lb

## 2014-12-25 DIAGNOSIS — IMO0002 Reserved for concepts with insufficient information to code with codable children: Secondary | ICD-10-CM

## 2014-12-25 DIAGNOSIS — E1165 Type 2 diabetes mellitus with hyperglycemia: Secondary | ICD-10-CM

## 2014-12-25 LAB — HEMOGLOBIN A1C: HEMOGLOBIN A1C: 6.8 % — AB (ref 4.6–6.5)

## 2014-12-25 LAB — MICROALBUMIN / CREATININE URINE RATIO
Creatinine,U: 188.6 mg/dL
MICROALB/CREAT RATIO: 1.8 mg/g (ref 0.0–30.0)
Microalb, Ur: 3.4 mg/dL — ABNORMAL HIGH (ref 0.0–1.9)

## 2014-12-25 NOTE — Patient Instructions (Signed)
Thank you for choosing [facility].  Summary/Instructions:  Please continue to take your medications as prescribed.   Please stop by the lab on the basement level of the building for your blood work. Your results will be released to MyChart (or called to you) after review, usually within 72 hours after test completion. If any changes need to be made, you will be notified at that same time.  If your symptoms worsen or fail to improve, please contact our office for further instruction, or in case of emergency go directly to the emergency room at the closest medical facility.     

## 2014-12-25 NOTE — Telephone Encounter (Signed)
Please inform patient that his A1c results are 6.8 indicating very good control of his diabetes. His urine looked okay as well. Please continue to take medications as prescribed and we will follow up in 6 months or sooner if needed.

## 2014-12-25 NOTE — Progress Notes (Signed)
Subjective:    Patient ID: Nathaniel Waters, male    DOB: Aug 11, 1955, 59 y.o.   MRN: [id number]  Chief Complaint  Patient presents with  . Follow-up    A1c check, in the mornings usually around 80-120s    HPI:  Nathaniel Waters is a 59 y.o. male who  has a past medical history of Diabetes mellitus and GERD (gastroesophageal reflux disease). and presents today for an office follow up.  1.)  Type 2 diabetes - Previously noted A1c of 10.5 which was improving with regimen of glipizide. Takes medications as prescribed and denies adverse side effects or hypoglycemic events. Home blood sugars are ranging between 80-120. Due for an eye exam. Due for a Pneumovax. Currently maintained on lisinopril and atorvastatin.    Lab Results  Component Value Date   HGBA1C 6.8* 12/25/2014    No Known Allergies   Current Outpatient Prescriptions on File Prior to Visit  Medication Sig Dispense Refill  . atorvastatin (LIPITOR) 40 MG tablet TAKE ONE TABLET BY MOUTH ONCE DAILY 30 tablet 0  . Blood Glucose Monitoring Suppl (ONE TOUCH ULTRA MINI) W/DEVICE KIT 1 Device by Does not apply route once. 1 each 0  . glipiZIDE (GLUCOTROL) 10 MG tablet TAKE ONE TABLET BY MOUTH ONCE DAILY BEFORE BREAKFAST 90 tablet 3  . glucose blood test strip Use as instructed 100 each 12  . glucose blood test strip Use as instructed 100 each 12  . lansoprazole (PREVACID) 15 MG capsule Take 15 mg by mouth daily.    Marland Kitchen lisinopril (PRINIVIL,ZESTRIL) 10 MG tablet TAKE ONE TABLET BY MOUTH DAILY 90 tablet 3   No current facility-administered medications on file prior to visit.     Past Surgical History  Procedure Laterality Date  . Carpal tunnel release      both   . Colonoscopy        Review of Systems  Constitutional: Negative for fever and chills.  Eyes:       Negative for changes in vision.   Respiratory: Negative for chest tightness and shortness of breath.   Cardiovascular: Negative for chest pain, palpitations and leg  swelling.  Endocrine: Negative for polydipsia, polyphagia and polyuria.  Neurological: Negative for headaches.      Objective:    BP 136/84 mmHg  Pulse 87  Temp(Src) 98.6 F (37 C) (Oral)  Resp 18  Ht _0  (1.803 m)  Wt 228 lb (103.42 kg)  BMI 31.81 kg/m2  SpO2 96% Nursing note and vital signs reviewed.  Physical Exam  Constitutional: He is oriented to person, place, and time. He appears well-developed and well-nourished. No distress.  Cardiovascular: Normal rate, regular rhythm, normal heart sounds and intact distal pulses.   Pulmonary/Chest: Effort normal and breath sounds normal.  Neurological: He is alert and oriented to person, place, and time.  Diabetic foot exam - bilateral feet are free from skin breakdown, cuts, and abrasions. Toenails are neatly trimmed. Pulses are intact and appropriate. Sensation is intact to monofilament bilaterally. Callus formation noted under left foot around base of metatarsal.   Skin: Skin is warm and dry.  Psychiatric: He has a normal mood and affect. His behavior is normal. Judgment and thought content normal.       Assessment & Plan:   Problem List Items Addressed This Visit      Other   Type II diabetes mellitus, uncontrolled - Primary    Diabetes appears to be well controlled with current regimen. Obtain A1c and microalbumin.  Continue current dosage of glipizide pending A1 results. Will schedule eye exam independently. Foot exam completed today. Refer to podiatry for callus. Hold Pneumovax pending urology treatment. Continue current dosage of lisinopril and atorvastain to reduce risk factors.       Relevant Orders   Hemoglobin A1c (Completed)   Microalbumin / creatinine urine ratio (Completed)   Ambulatory referral to Podiatry

## 2014-12-25 NOTE — Assessment & Plan Note (Signed)
Diabetes appears to be well controlled with current regimen. Obtain A1c and microalbumin. Continue current dosage of glipizide pending A1 results. Will schedule eye exam independently. Foot exam completed today. Refer to podiatry for callus. Hold Pneumovax pending urology treatment. Continue current dosage of lisinopril and atorvastain to reduce risk factors.

## 2014-12-25 NOTE — Progress Notes (Signed)
Pre visit review using our clinic review tool, if applicable. No additional management support is needed unless otherwise documented below in the visit note. 

## 2014-12-26 NOTE — Telephone Encounter (Signed)
Pt aware of results. Made a 6 month follow up.

## 2014-12-27 ENCOUNTER — Other Ambulatory Visit: Payer: Self-pay | Admitting: Family

## 2015-01-30 ENCOUNTER — Ambulatory Visit (INDEPENDENT_AMBULATORY_CARE_PROVIDER_SITE_OTHER): Payer: BLUE CROSS/BLUE SHIELD | Admitting: Podiatry

## 2015-01-30 ENCOUNTER — Encounter: Payer: Self-pay | Admitting: Podiatry

## 2015-01-30 VITALS — BP 168/97 | HR 83 | Temp 98.7°F | Resp 14

## 2015-01-30 DIAGNOSIS — E119 Type 2 diabetes mellitus without complications: Secondary | ICD-10-CM | POA: Diagnosis not present

## 2015-01-30 DIAGNOSIS — Q828 Other specified congenital malformations of skin: Secondary | ICD-10-CM | POA: Diagnosis not present

## 2015-01-30 NOTE — Progress Notes (Signed)
   Subjective:    Patient ID: Nathaniel Waters, male    DOB: Dec 30, 1955, 59 y.o.   MRN: [id number]  HPI N-sharpe L  This patient presents today complaining of a recurrent plantar skin lesion on the left foot for the past 7-8 years. The plantar left foot skin lesions causes pain upon standing and walking and is relieved with rest and elevation. Patient has been able to self treat the area by scraping the area with temporary relief. He says the pain is becoming more uncomfortable over time. He describes one episode reapplied over-the-counter acid which provided longer relief. Patient has walking standing job requiring continuous weightbearing  Patient denies any history of foot ulceration, claudication or amputation  Review of Systems  All other systems reviewed and are negative.      Objective:   Physical Exam  Orientated 3  Vascular: No peripheral edema bilaterally DP and PT pulses 2/4 bilaterally Capillary reflex immediate bilaterally  Neurological: Sensation to 10 g monofilament wire intact 5/5 right and 4/5 left Vibratory sensation reactive bilaterally Ankle reflex equal and reactive bilaterally  Dermatological: Texture and turgor within normal limits Nucleated punctate keratoses plantar left first MPJ Diffuse plantar callus right first MPJ  Musculoskeletal: No deformities noted bilaterally There is no restriction ankle, subtalar or midtarsal joints bilaterally       Assessment & Plan:   Assessment: Satisfactory neurovascular status Diabetic without complication Porokeratosis 1, left foot Plan Today review the results of the examination patient and advised him he has satisfactory neurovascular status. I informed her that the plantar skin lesion was a chronic lesion and I recommended periodic debridement. I also advised him not to use any over-the-counter acid. Today I debrided the porokeratosis and packed the area with salinocaine.  Reappoint when necessary at  patient's request

## 2015-01-30 NOTE — Patient Instructions (Signed)
Today your diabetic foot examination demonstrated good pulsations injuring your feet and adequate feeling You have a nucleated corn on the bottom of the left foot that requires trimming as needed to relieve discomfort If possible leave bandages on the bottom left foot 3-5 days Return as needed  Diabetes and Foot Care Diabetes may cause you to have problems because of poor blood supply (circulation) to your feet and legs. This may cause the skin on your feet to become thinner, break easier, and heal more slowly. Your skin may become dry, and the skin may peel and crack. You may also have nerve damage in your legs and feet causing decreased feeling in them. You may not notice minor injuries to your feet that could lead to infections or more serious problems. Taking care of your feet is one of the most important things you can do for yourself.  HOME CARE INSTRUCTIONS  Wear shoes at all times, even in the house. Do not go barefoot. Bare feet are easily injured.  Check your feet daily for blisters, cuts, and redness. If you cannot see the bottom of your feet, use a mirror or ask someone for help.  Wash your feet with warm water (do not use hot water) and mild soap. Then pat your feet and the areas between your toes until they are completely dry. Do not soak your feet as this can dry your skin.  Apply a moisturizing lotion or petroleum jelly (that does not contain alcohol and is unscented) to the skin on your feet and to dry, brittle toenails. Do not apply lotion between your toes.  Trim your toenails straight across. Do not dig under them or around the cuticle. File the edges of your nails with an emery board or nail file.  Do not cut corns or calluses or try to remove them with medicine.  Wear clean socks or stockings every day. Make sure they are not too tight. Do not wear knee-high stockings since they may decrease blood flow to your legs.  Wear shoes that fit properly and have enough  cushioning. To break in new shoes, wear them for just a few hours a day. This prevents you from injuring your feet. Always look in your shoes before you put them on to be sure there are no objects inside.  Do not cross your legs. This may decrease the blood flow to your feet.  If you find a minor scrape, cut, or break in the skin on your feet, keep it and the skin around it clean and dry. These areas may be cleansed with mild soap and water. Do not cleanse the area with peroxide, alcohol, or iodine.  When you remove an adhesive bandage, be sure not to damage the skin around it.  If you have a wound, look at it several times a day to make sure it is healing.  Do not use heating pads or hot water bottles. They may burn your skin. If you have lost feeling in your feet or legs, you may not know it is happening until it is too late.  Make sure your health care provider performs a complete foot exam at least annually or more often if you have foot problems. Report any cuts, sores, or bruises to your health care provider immediately. SEEK MEDICAL CARE IF:   You have an injury that is not healing.  You have cuts or breaks in the skin.  You have an ingrown nail.  You notice redness on your  legs or feet.  You feel burning or tingling in your legs or feet.  You have pain or cramps in your legs and feet.  Your legs or feet are numb.  Your feet always feel cold. SEEK IMMEDIATE MEDICAL CARE IF:   There is increasing redness, swelling, or pain in or around a wound.  There is a red line that goes up your leg.  Pus is coming from a wound.  You develop a fever or as directed by your health care provider.  You notice a bad smell coming from an ulcer or wound.   This information is not intended to replace advice given to you by your health care provider. Make sure you discuss any questions you have with your health care provider.   Document Released: 03/21/2000 Document Revised: 11/24/2012  Document Reviewed: 08/31/2012 Elsevier Interactive Patient Education [employer].

## 2015-02-03 ENCOUNTER — Other Ambulatory Visit: Payer: Self-pay | Admitting: Family

## 2015-03-09 ENCOUNTER — Other Ambulatory Visit: Payer: Self-pay | Admitting: Family

## 2015-03-12 ENCOUNTER — Other Ambulatory Visit: Payer: Self-pay | Admitting: Family

## 2015-06-06 ENCOUNTER — Ambulatory Visit: Payer: BLUE CROSS/BLUE SHIELD | Admitting: Family

## 2015-06-14 ENCOUNTER — Encounter: Payer: Self-pay | Admitting: Family

## 2015-06-14 ENCOUNTER — Other Ambulatory Visit (INDEPENDENT_AMBULATORY_CARE_PROVIDER_SITE_OTHER): Payer: BLUE CROSS/BLUE SHIELD

## 2015-06-14 ENCOUNTER — Ambulatory Visit (INDEPENDENT_AMBULATORY_CARE_PROVIDER_SITE_OTHER): Payer: BLUE CROSS/BLUE SHIELD | Admitting: Family

## 2015-06-14 VITALS — BP 144/84 | HR 69 | Temp 98.2°F | Ht 71.0 in | Wt 233.2 lb

## 2015-06-14 DIAGNOSIS — IMO0001 Reserved for inherently not codable concepts without codable children: Secondary | ICD-10-CM

## 2015-06-14 DIAGNOSIS — E1165 Type 2 diabetes mellitus with hyperglycemia: Secondary | ICD-10-CM

## 2015-06-14 LAB — HEMOGLOBIN A1C: Hgb A1c MFr Bld: 7.1 % — ABNORMAL HIGH (ref 4.6–6.5)

## 2015-06-14 MED ORDER — LISINOPRIL 10 MG PO TABS: 10.0000 mg | ORAL_TABLET | Freq: Every day | ORAL | Status: DC

## 2015-06-14 MED ORDER — ATORVASTATIN CALCIUM 40 MG PO TABS: 40.0000 mg | ORAL_TABLET | Freq: Every day | ORAL | Status: DC

## 2015-06-14 MED ORDER — GLIPIZIDE 10 MG PO TABS: 10.0000 mg | ORAL_TABLET | Freq: Every day | ORAL | Status: DC

## 2015-06-14 NOTE — Assessment & Plan Note (Signed)
Type 2 diabetes with improved control with most recent A1c of 6.8 and home blood sugar readings ranging between 100-130. Obtain A1c. Due for diabetic eye exam encouraged to be completed independently. Diabetic foot exam completed today and normal. Continue current dosage of glipizide. Maintained on atorvastatin and lisinopril for CAD risk reduction. Follow-up in 6 months pending A1c results.

## 2015-06-14 NOTE — Patient Instructions (Addendum)
Thank you for choosing [facility].  Summary/Instructions:  Please schedule a time for your diabetic eye exam.  Your prescription(s) have been submitted to your pharmacy or been printed and provided for you. Please take as directed and contact our office if you believe you are having problem(s) with the medication(s) or have any questions.  Please stop by the lab on the basement level of the building for your blood work. Your results will be released to [place] (or called to you) after review, usually within 72 hours after test completion. If any changes need to be made, you will be notified at that same time.  If your symptoms worsen or fail to improve, please contact our office for further instruction, or in case of emergency go directly to the emergency room at the closest medical facility.

## 2015-06-14 NOTE — Progress Notes (Signed)
   Subjective:    Patient ID: Nathaniel Waters, male    DOB: 10/21/1955, 60 y.o.   MRN: [id number]  Chief Complaint  Patient presents with  . Follow-up    DM    HPI:  Crew Goren is a 60 y.o. male who  has a past medical history of Diabetes mellitus and GERD (gastroesophageal reflux disease). and presents today for a follow up office visit.  1.) Type 2 diabetes - Currently maintained on glipizide. Reports taking the medication as prescribed and denies adverse side effects. Home blood sugar readings are staying around 100-130. No hypoglycemic readings. No symptoms of end organ damage.   Lab Results  Component Value Date   HGBA1C 6.8* 12/25/2014    No Known Allergies   Current Outpatient Prescriptions on File Prior to Visit  Medication Sig Dispense Refill  . Blood Glucose Monitoring Suppl (ONE TOUCH ULTRA MINI) W/DEVICE KIT 1 Device by Does not apply route once. 1 each 0  . glucose blood test strip Use as instructed 100 each 12  . lansoprazole (PREVACID) 15 MG capsule Take 15 mg by mouth daily.     No current facility-administered medications on file prior to visit.    Review of Systems  Eyes:       Negative for changes in vision.  Respiratory: Negative for chest tightness and shortness of breath.   Cardiovascular: Negative for chest pain, palpitations and leg swelling.  Endocrine: Negative for polydipsia, polyphagia and polyuria.  Neurological: Negative for numbness.      Objective:    BP 144/84 mmHg  Pulse 69  Temp(Src) 98.2 F (36.8 C) (Oral)  Ht '5\' 11"'$  (1.803 m)  Wt 233 lb 4 oz (105.802 kg)  BMI 32.55 kg/m2  SpO2 96% Nursing note and vital signs reviewed.  Physical Exam  Constitutional: He is oriented to person, place, and time. He appears well-developed and well-nourished. No distress.  Cardiovascular: Normal rate, regular rhythm, normal heart sounds and intact distal pulses.   Pulmonary/Chest: Effort normal and breath sounds normal.  Neurological: He is alert  and oriented to person, place, and time.  Diabetic Foot Exam - Simple   Simple Foot Form  Diabetic Foot exam was performed with the following findings:  Yes  06/14/2015 11:22 AM  Visual Inspection  No deformities, no ulcerations, no other skin breakdown bilaterally:  Yes  Sensation Testing  Intact to touch and monofilament testing bilaterally:  Yes  Pulse Check  Posterior Tibialis and Dorsalis pulse intact bilaterally:  Yes    Skin: Skin is warm and dry.  Psychiatric: He has a normal mood and affect. His behavior is normal. Judgment and thought content normal.       Assessment & Plan:   Problem List Items Addressed This Visit      Endocrine   Type II diabetes mellitus, uncontrolled ([place]) - Primary    Type 2 diabetes with improved control with most recent A1c of 6.8 and home blood sugar readings ranging between 100-130. Obtain A1c. Due for diabetic eye exam encouraged to be completed independently. Diabetic foot exam completed today and normal. Continue current dosage of glipizide. Maintained on atorvastatin and lisinopril for CAD risk reduction. Follow-up in 6 months pending A1c results.      Relevant Medications   atorvastatin (LIPITOR) 40 MG tablet   glipiZIDE (GLUCOTROL) 10 MG tablet   lisinopril (PRINIVIL,ZESTRIL) 10 MG tablet   Other Relevant Orders   Hemoglobin A1c

## 2015-06-14 NOTE — Progress Notes (Signed)
Pre visit review using our clinic review tool, if applicable. No additional management support is needed unless otherwise documented below in the visit note. 

## 2015-06-17 ENCOUNTER — Telehealth: Payer: Self-pay | Admitting: Family

## 2015-06-17 NOTE — Telephone Encounter (Signed)
Please inform patient that his A1c is 7.1 which is increased from his previous 6.8. Therefore please continue to take his medications as prescribed and I encourage him to continue to work on nutrition and physical activity before we add any additional medication. Please have him follow up in 3 months for a new A1c.

## 2015-06-18 MED ORDER — LANSOPRAZOLE 15 MG PO CPDR: 15.0000 mg | DELAYED_RELEASE_CAPSULE | Freq: Every day | ORAL | Status: DC

## 2015-06-18 NOTE — Telephone Encounter (Signed)
Medication sent to pharmacy  

## 2015-06-18 NOTE — Telephone Encounter (Signed)
Pt informed

## 2015-06-18 NOTE — Telephone Encounter (Signed)
Pt informed of results.   While speaking to pt he requested an rx for prevacid or similar medicaation. Please advise.

## 2015-07-06 ENCOUNTER — Telehealth: Payer: Self-pay

## 2015-07-06 NOTE — Telephone Encounter (Signed)
LVM for pt to call back as soon as possible.   RE: Flu Vaccine for this flu season.   

## 2015-09-14 ENCOUNTER — Other Ambulatory Visit: Payer: Self-pay | Admitting: Family

## 2015-09-18 ENCOUNTER — Ambulatory Visit: Payer: BLUE CROSS/BLUE SHIELD | Admitting: Family

## 2015-09-24 ENCOUNTER — Ambulatory Visit (INDEPENDENT_AMBULATORY_CARE_PROVIDER_SITE_OTHER): Payer: BLUE CROSS/BLUE SHIELD | Admitting: Family

## 2015-09-24 ENCOUNTER — Encounter: Payer: Self-pay | Admitting: Family

## 2015-09-24 VITALS — BP 138/88 | HR 89 | Temp 98.2°F | Resp 16 | Ht 71.0 in | Wt 231.0 lb

## 2015-09-24 DIAGNOSIS — Z23 Encounter for immunization: Secondary | ICD-10-CM | POA: Diagnosis not present

## 2015-09-24 DIAGNOSIS — E1165 Type 2 diabetes mellitus with hyperglycemia: Secondary | ICD-10-CM

## 2015-09-24 DIAGNOSIS — IMO0001 Reserved for inherently not codable concepts without codable children: Secondary | ICD-10-CM

## 2015-09-24 LAB — POCT GLYCOSYLATED HEMOGLOBIN (HGB A1C): Hemoglobin A1C: 6.9

## 2015-09-24 NOTE — Progress Notes (Signed)
Pre visit review using our clinic review tool, if applicable. No additional management support is needed unless otherwise documented below in the visit note. 

## 2015-09-24 NOTE — Progress Notes (Signed)
Subjective:    Patient ID: Nathaniel Waters, male    DOB: 07-Feb-1956, 60 y.o.   MRN: [id number]  Chief Complaint  Patient presents with  . Follow-up    A1c check    HPI:  Nathaniel Waters is a 60 y.o. male who  has a past medical history of Diabetes mellitus and GERD (gastroesophageal reflux disease). and presents today for a follow up office visit.  1.) Type 2 diabetes - currently maintained on glipizide. Reports taking the medication as prescribed and denies adverse side effects with 1 episode of hypoglycemia. Hypoglycemia event was related to working out and not eating which was corrected with apple juice.  Blood sugars at home 110-115 on average. Denies new symptoms of end organ damage. Continues to work on lifestyle management to help control.  Lab Results  Component Value Date   HGBA1C 6.9 09/24/2015    No Known Allergies   Current Outpatient Prescriptions on File Prior to Visit  Medication Sig Dispense Refill  . atorvastatin (LIPITOR) 40 MG tablet Take 1 tablet (40 mg total) by mouth daily. 90 tablet 3  . Blood Glucose Monitoring Suppl (ONE TOUCH ULTRA MINI) W/DEVICE KIT 1 Device by Does not apply route once. 1 each 0  . glipiZIDE (GLUCOTROL) 10 MG tablet Take 1 tablet (10 mg total) by mouth daily before breakfast. 90 tablet 3  . glucose blood test strip Use as instructed 100 each 12  . lansoprazole (PREVACID) 15 MG capsule TAKE ONE CAPSULE BY MOUTH DAILY 90 capsule 1  . lisinopril (PRINIVIL,ZESTRIL) 10 MG tablet Take 1 tablet (10 mg total) by mouth daily. 90 tablet 3   No current facility-administered medications on file prior to visit.    Review of Systems  Constitutional: Negative for fever and chills.  Respiratory: Negative for chest tightness and shortness of breath.   Cardiovascular: Negative for chest pain, palpitations and leg swelling.  Endocrine: Negative for polydipsia, polyphagia and polyuria.  Neurological: Negative for weakness, numbness and headaches.        Objective:    BP 138/88 mmHg  Pulse 89  Temp(Src) 98.2 F (36.8 C) (Oral)  Resp 16  Ht '5\' 11"'  (1.803 m)  Wt 231 lb (104.781 kg)  BMI 32.23 kg/m2  SpO2 95% Nursing note and vital signs reviewed.  Physical Exam  Constitutional: He is oriented to person, place, and time. He appears well-developed and well-nourished. No distress.  Cardiovascular: Normal rate, regular rhythm, normal heart sounds and intact distal pulses.   Pulmonary/Chest: Effort normal and breath sounds normal.  Musculoskeletal:  Left foot with small callus located on the plantar aspect of the first MTP joint.   Neurological: He is alert and oriented to person, place, and time.  Skin: Skin is warm and dry.  Psychiatric: He has a normal mood and affect. His behavior is normal. Judgment and thought content normal.       Assessment & Plan:   Problem List Items Addressed This Visit      Endocrine   Type II diabetes mellitus, uncontrolled ([place]) - Primary    A1c today is 6.9 indicating improved control. No adverse side effects or symptoms of end organ damage. Declines Pneumovax. Maintained on lisinopril and atorvastatin for CAD risk reduction. Referral placed for diabetic eye exam. Diabetic foot exam and urine microalbumin are up-to-date. Continue current dosage of glipizide. Continue lifestyle management improvements.      Relevant Orders   POCT HgB A1C (Completed)    Other Visit Diagnoses  Need for diphtheria-tetanus-pertussis (Tdap) vaccine, adult/adolescent        Relevant Orders    Tdap vaccine greater than or equal to 7yo IM (Completed)        I am having Nathaniel Waters maintain his glucose blood, ONE TOUCH ULTRA MINI, atorvastatin, glipiZIDE, lisinopril, and lansoprazole.   Follow-up: Return in about 6 months (around 03/25/2016), or if symptoms worsen or fail to improve.   Mauricio Po, FNP

## 2015-09-24 NOTE — Progress Notes (Deleted)
Subjective:     Patient ID: Nathaniel Waters, male   DOB: [birth year], 60 y.o.   MRN: [id number]  HPI: 1. Diabetes: This is an ongoing problem. Presents for f/u for diabetes management. Reports blood sugars run around 110-115 at home with highest 130. Denies polydipsia, polyphagia, or polyuria. Reports one episode where sugar dropped to 57 after a long exercise session. Came up quickly with apple juice. States he is eating well and not skipping meals.   2. Foot sore: Previously seen by podiatry for a callous on left foot on plantar portion of foot. Modifiying factor includes shaving calloused area about every other week to prevent build up of tissue. Painful when applies pressure.    Review of Systems     Objective:   Physical Exam     Assessment:     ***    Plan:     ***

## 2015-09-24 NOTE — Assessment & Plan Note (Signed)
A1c today is 6.9 indicating improved control. No adverse side effects or symptoms of end organ damage. Declines Pneumovax. Maintained on lisinopril and atorvastatin for CAD risk reduction. Referral placed for diabetic eye exam. Diabetic foot exam and urine microalbumin are up-to-date. Continue current dosage of glipizide. Continue lifestyle management improvements.

## 2015-09-24 NOTE — Patient Instructions (Addendum)
Thank you for choosing [facility].  Summary/Instructions:  Please continue to take your medications a prescribed.   Your prescription(s) have been submitted to your pharmacy or been printed and provided for you. Please take as directed and contact our office if you believe you are having problem(s) with the medication(s) or have any questions.  If your symptoms worsen or fail to improve, please contact our office for further instruction, or in case of emergency go directly to the emergency room at the closest medical facility.

## 2015-10-24 ENCOUNTER — Other Ambulatory Visit: Payer: Self-pay | Admitting: Family

## 2016-03-24 ENCOUNTER — Telehealth: Payer: Self-pay | Admitting: Emergency Medicine

## 2016-03-24 MED ORDER — LANSOPRAZOLE 15 MG PO CPDR: 15.0000 mg | DELAYED_RELEASE_CAPSULE | Freq: Every day | ORAL | 1 refills | Status: DC

## 2016-03-24 NOTE — Telephone Encounter (Signed)
Pt needs a prescription refill on his lansoprazole (PREVACID) 15 MG capsule. Please advise thanks.

## 2016-03-24 NOTE — Telephone Encounter (Signed)
Rx sent 

## 2016-03-24 NOTE — Telephone Encounter (Deleted)
Rx faxed

## 2016-03-25 ENCOUNTER — Other Ambulatory Visit: Payer: Self-pay | Admitting: Family

## 2016-04-07 HISTORY — PX: COLONOSCOPY: SHX174

## 2016-06-11 ENCOUNTER — Other Ambulatory Visit: Payer: Self-pay | Admitting: Family

## 2016-07-23 ENCOUNTER — Encounter: Payer: Self-pay | Admitting: *Deleted

## 2016-08-29 ENCOUNTER — Encounter: Payer: Self-pay | Admitting: Internal Medicine

## 2016-09-05 ENCOUNTER — Encounter: Payer: Self-pay | Admitting: Internal Medicine

## 2016-10-02 ENCOUNTER — Other Ambulatory Visit: Payer: Self-pay | Admitting: Family

## 2016-11-07 ENCOUNTER — Encounter: Payer: Self-pay | Admitting: Internal Medicine

## 2016-11-07 ENCOUNTER — Ambulatory Visit (AMBULATORY_SURGERY_CENTER): Payer: Self-pay

## 2016-11-07 ENCOUNTER — Other Ambulatory Visit: Payer: BLUE CROSS/BLUE SHIELD

## 2016-11-07 VITALS — Ht 71.0 in | Wt 215.0 lb

## 2016-11-07 DIAGNOSIS — Z860100 Personal history of colon polyps, unspecified: Secondary | ICD-10-CM

## 2016-11-07 MED ORDER — SUPREP BOWEL PREP KIT 17.5-3.13-1.6 GM/177ML PO SOLN: 1.0000 | Freq: Once | ORAL | 0 refills | Status: AC

## 2016-11-07 NOTE — Progress Notes (Signed)
No allergies to eggs or soy No diet meds No home oxygen No past problems with anesthesia  Declined emmi 

## 2016-11-18 ENCOUNTER — Encounter: Payer: BLUE CROSS/BLUE SHIELD | Admitting: Internal Medicine

## 2016-11-19 ENCOUNTER — Encounter: Payer: Self-pay | Admitting: Internal Medicine

## 2016-11-19 ENCOUNTER — Ambulatory Visit (AMBULATORY_SURGERY_CENTER): Payer: BLUE CROSS/BLUE SHIELD | Admitting: Internal Medicine

## 2016-11-19 VITALS — BP 121/79 | HR 77 | Temp 99.6°F | Resp 27 | Ht 71.0 in | Wt 215.0 lb

## 2016-11-19 DIAGNOSIS — K621 Rectal polyp: Secondary | ICD-10-CM

## 2016-11-19 DIAGNOSIS — D128 Benign neoplasm of rectum: Secondary | ICD-10-CM

## 2016-11-19 DIAGNOSIS — D123 Benign neoplasm of transverse colon: Secondary | ICD-10-CM

## 2016-11-19 DIAGNOSIS — D129 Benign neoplasm of anus and anal canal: Secondary | ICD-10-CM

## 2016-11-19 DIAGNOSIS — D124 Benign neoplasm of descending colon: Secondary | ICD-10-CM

## 2016-11-19 DIAGNOSIS — Z86010 Personal history of colonic polyps: Secondary | ICD-10-CM | POA: Diagnosis not present

## 2016-11-19 MED ORDER — SODIUM CHLORIDE 0.9 % IV SOLN: 500.0000 mL | INTRAVENOUS | Status: DC

## 2016-11-19 NOTE — Progress Notes (Signed)
Called to room to assist during endoscopic procedure.  Patient ID and intended procedure confirmed with present staff. Received instructions for my participation in the procedure from the performing physician.  

## 2016-11-19 NOTE — Op Note (Signed)
[facility] Patient Name: Nathaniel Waters Procedure Date: 11/19/2016 1:22 PM MRN: [id number] Endoscopist: Jerene Bears , MD Age: 61 Referring MD:  Date of Birth: 08-May-1955 Gender: Male Account #: [id number] Procedure:                Colonoscopy Indications:              Surveillance: Personal history of adenomatous                            polyps on last colonoscopy 5 years ago Medicines:                Monitored Anesthesia Care Procedure:                Pre-Anesthesia Assessment:                           - Prior to the procedure, a History and Physical                            was performed, and patient medications and                            allergies were reviewed. The patient's tolerance of                            previous anesthesia was also reviewed. The risks                            and benefits of the procedure and the sedation                            options and risks were discussed with the patient.                            All questions were answered, and informed consent                            was obtained. Prior Anticoagulants: The patient has                            taken no previous anticoagulant or antiplatelet                            agents. ASA Grade Assessment: II - A patient with                            mild systemic disease. After reviewing the risks                            and benefits, the patient was deemed in                            satisfactory condition to undergo the procedure.  After obtaining informed consent, the colonoscope                            was passed under direct vision. Throughout the                            procedure, the patient's blood pressure, pulse, and                            oxygen saturations were monitored continuously. The                            Colonoscope was introduced through the anus and                            advanced to the the cecum,  identified by                            appendiceal orifice and ileocecal valve. The                            colonoscopy was performed without difficulty. The                            patient tolerated the procedure well. The quality                            of the bowel preparation was good. The ileocecal                            valve, appendiceal orifice, and rectum were                            photographed. Scope In: 1:36:20 PM Scope Out: 1:52:59 PM Scope Withdrawal Time: 0 hours 14 minutes 9 seconds  Total Procedure Duration: 0 hours 16 minutes 39 seconds  Findings:                 The digital rectal exam was normal.                           Three sessile polyps were found in the transverse                            colon. The polyps were 3 to 8 mm in size. These                            polyps were removed with a cold snare. Resection                            and retrieval were complete.                           A 5 mm polyp was found in the descending colon. The  polyp was sessile. The polyp was removed with a                            cold snare. Resection and retrieval were complete.                           A 2 mm polyp was found in the descending colon. The                            polyp was sessile. The polyp was removed with a                            cold biopsy forceps. Resection and retrieval were                            complete.                           A 3 mm polyp was found in the rectum. The polyp was                            sessile. The polyp was removed with a cold biopsy                            forceps. Resection and retrieval were complete.                           Multiple medium-mouthed diverticula were found in                            the sigmoid colon.                           Internal hemorrhoids were found during                            retroflexion. The hemorrhoids were  small. Complications:            No immediate complications. Estimated Blood Loss:     Estimated blood loss was minimal. Impression:               - Three 3 to 8 mm polyps in the transverse colon,                            removed with a cold snare. Resected and retrieved.                           - One 5 mm polyp in the descending colon, removed                            with a cold snare. Resected and retrieved.                           - One 2 mm polyp  in the descending colon, removed                            with a cold biopsy forceps. Resected and retrieved.                           - One 3 mm polyp in the rectum, removed with a cold                            biopsy forceps. Resected and retrieved.                           - Mild diverticulosis in the sigmoid colon.                           - Small internal hemorrhoids. Recommendation:           - Patient has a contact number available for                            emergencies. The signs and symptoms of potential                            delayed complications were discussed with the                            patient. Return to normal activities tomorrow.                            Written discharge instructions were provided to the                            patient.                           - Resume previous diet.                           - Continue present medications.                           - Await pathology results.                           - Repeat colonoscopy is recommended for                            surveillance. The colonoscopy date will be                            determined after pathology results from today's                            exam become available for review. Jerene Bears, MD 11/19/2016 2:01:09 PM This report has been signed electronically.

## 2016-11-19 NOTE — Progress Notes (Signed)
To recovery, report to RN, VSS. 

## 2016-11-19 NOTE — Progress Notes (Signed)
Pt's states no medical or surgical changes since previsit or office visit. 

## 2016-11-19 NOTE — Patient Instructions (Signed)
YOU HAD AN ENDOSCOPIC PROCEDURE TODAY AT [facility]:   Refer to the procedure report that was given to you for any specific questions about what was found during the examination.  If the procedure report does not answer your questions, please call your gastroenterologist to clarify.  If you requested that your care partner not be given the details of your procedure findings, then the procedure report has been included in a sealed envelope for you to review at your convenience later.  YOU SHOULD EXPECT: Some feelings of bloating in the abdomen. Passage of more gas than usual.  Walking can help get rid of the air that was put into your GI tract during the procedure and reduce the bloating. If you had a lower endoscopy (such as a colonoscopy or flexible sigmoidoscopy) you may notice spotting of blood in your stool or on the toilet paper. If you underwent a bowel prep for your procedure, you may not have a normal bowel movement for a few days.  Please Note:  You might notice some irritation and congestion in your nose or some drainage.  This is from the oxygen used during your procedure.  There is no need for concern and it should clear up in a day or so.  SYMPTOMS TO REPORT IMMEDIATELY:   Following lower endoscopy (colonoscopy or flexible sigmoidoscopy):  Excessive amounts of blood in the stool  Significant tenderness or worsening of abdominal pains  Swelling of the abdomen that is new, acute  Fever of 100F or higher  For urgent or emergent issues, a gastroenterologist can be reached at any hour by calling [phone].  DIET:  We do recommend a small meal at first, but then you may proceed to your regular diet.  Drink plenty of fluids but you should avoid alcoholic beverages for 24 hours.  ACTIVITY:  You should plan to take it easy for the rest of today and you should NOT DRIVE or use heavy machinery until tomorrow (because of the sedation medicines used during the test).     FOLLOW UP: Our staff will call the number listed on your records the next business day following your procedure to check on you and address any questions or concerns that you may have regarding the information given to you following your procedure. If we do not reach you, we will leave a message.  However, if you are feeling well and you are not experiencing any problems, there is no need to return our call.  We will assume that you have returned to your regular daily activities without incident.  If any biopsies were taken you will be contacted by phone or by letter within the next 1-3 weeks.  Please call us at [phone] if you have not heard about the biopsies in 3 weeks.   SIGNATURES/CONFIDENTIALITY: You and/or your care partner have signed paperwork which will be entered into your electronic medical record.  These signatures attest to the fact that that the information above on your After Visit Summary has been reviewed and is understood.  Full responsibility of the confidentiality of this discharge information lies with you and/or your care-partner.  Please read over handouts about polyps, diverticulosis and hemorrhoids  Await pathology  Please continue your normal medications

## 2016-11-20 ENCOUNTER — Telehealth: Payer: Self-pay | Admitting: *Deleted

## 2016-11-20 NOTE — Telephone Encounter (Signed)
  Follow up Call-  Call back number 11/19/2016  Post procedure Call Back phone  # [phone]  Permission to leave phone message Yes  Some recent data might be hidden     Patient questions:  Do you have a fever, pain , or abdominal swelling? No. Pain Score  0 *  Have you tolerated food without any problems? Yes.    Have you been able to return to your normal activities? Yes.    Do you have any questions about your discharge instructions: Diet   No. Medications  No. Follow up visit  No.  Do you have questions or concerns about your Care? No.  Actions: * If pain score is 4 or above: No action needed, pain <4.

## 2016-11-24 ENCOUNTER — Encounter: Payer: Self-pay | Admitting: Internal Medicine

## 2016-12-10 ENCOUNTER — Encounter: Payer: Self-pay | Admitting: Family

## 2016-12-10 ENCOUNTER — Ambulatory Visit (INDEPENDENT_AMBULATORY_CARE_PROVIDER_SITE_OTHER): Payer: BLUE CROSS/BLUE SHIELD | Admitting: Family

## 2016-12-10 ENCOUNTER — Other Ambulatory Visit (INDEPENDENT_AMBULATORY_CARE_PROVIDER_SITE_OTHER): Payer: BLUE CROSS/BLUE SHIELD

## 2016-12-10 ENCOUNTER — Other Ambulatory Visit: Payer: Self-pay | Admitting: Family

## 2016-12-10 VITALS — BP 140/90 | HR 90 | Temp 99.0°F | Resp 16 | Ht 71.0 in | Wt 215.0 lb

## 2016-12-10 DIAGNOSIS — Z23 Encounter for immunization: Secondary | ICD-10-CM

## 2016-12-10 DIAGNOSIS — M545 Low back pain, unspecified: Secondary | ICD-10-CM

## 2016-12-10 DIAGNOSIS — E1165 Type 2 diabetes mellitus with hyperglycemia: Secondary | ICD-10-CM | POA: Diagnosis not present

## 2016-12-10 DIAGNOSIS — IMO0001 Reserved for inherently not codable concepts without codable children: Secondary | ICD-10-CM

## 2016-12-10 DIAGNOSIS — R21 Rash and other nonspecific skin eruption: Secondary | ICD-10-CM | POA: Diagnosis not present

## 2016-12-10 LAB — COMPREHENSIVE METABOLIC PANEL
ALBUMIN: 4 g/dL (ref 3.5–5.2)
ALK PHOS: 119 U/L — AB (ref 39–117)
ALT: 30 U/L (ref 0–53)
AST: 22 U/L (ref 0–37)
BUN: 16 mg/dL (ref 6–23)
CALCIUM: 9.5 mg/dL (ref 8.4–10.5)
CO2: 26 mEq/L (ref 19–32)
Chloride: 100 mEq/L (ref 96–112)
Creatinine, Ser: 1.04 mg/dL (ref 0.40–1.50)
GFR: 93.14 mL/min (ref >60.00)
Glucose, Bld: 347 mg/dL — ABNORMAL HIGH (ref 70–99)
POTASSIUM: 4 meq/L (ref 3.5–5.1)
SODIUM: 132 meq/L — AB (ref 135–145)
TOTAL PROTEIN: 7 g/dL (ref 6.0–8.3)
Total Bilirubin: 0.6 mg/dL (ref 0.2–1.2)

## 2016-12-10 LAB — MICROALBUMIN / CREATININE URINE RATIO
Creatinine,U: 63.6 mg/dL
MICROALB UR: 1.3 mg/dL (ref 0.0–1.9)
MICROALB/CREAT RATIO: 2 mg/g (ref 0.0–30.0)

## 2016-12-10 LAB — HEMOGLOBIN A1C: HEMOGLOBIN A1C: 11.3 % — AB (ref 4.6–6.5)

## 2016-12-10 MED ORDER — EMPAGLIFLOZIN 10 MG PO TABS: 10.0000 mg | ORAL_TABLET | Freq: Every day | ORAL | 2 refills | Status: DC

## 2016-12-10 MED ORDER — HYDROCORTISONE 2.5 % EX CREA: TOPICAL_CREAM | Freq: Two times a day (BID) | CUTANEOUS | 0 refills | Status: DC

## 2016-12-10 NOTE — Assessment & Plan Note (Signed)
New onset rash appears consistent with dermatitis. Start hydrocortisone cream. Follow up if symptoms worsen or do not improve.

## 2016-12-10 NOTE — Assessment & Plan Note (Signed)
Obtain hemoglobin A1c and urine microalbumin. Previous blood sugars are adequately controlled but notes some increased numbers recently. Diabetic foot exam completed today. Continue current dosage of glipizide. Encouraged to monitor blood sugars at home and follow reduced carbohydrate intake. Maintained on lisinopril and atorvastatin for CAD risk reduction. Follow up and changes pending A1c results if needed.

## 2016-12-10 NOTE — Patient Instructions (Signed)
Thank you for choosing [facility].  SUMMARY AND INSTRUCTIONS:  Start hydrocortisone cream for your arm.   Ice x 20 minutes following activity and after work and as needed.  Stretches and exercises daily.  Continue to take your medications as prescribed.   We will check your A1c, kidney function, liver function, and electrolytes.   Medication:  Your prescription(s) have been submitted to your pharmacy or been printed and provided for you. Please take as directed and contact our office if you believe you are having problem(s) with the medication(s) or have any questions.  Labs:  Please stop by the lab on the lower level of the building for your blood work. Your results will be released to [facility] (or called to you) after review, usually within 72 hours after test completion. If any changes need to be made, you will be notified at that same time.  1.) The lab is open from 7:30am to 5:30 pm Monday-Friday 2.) No appointment is necessary 3.) Fasting (if needed) is 6-8 hours after food and drink; black coffee and water are okay   Follow up:  If your symptoms worsen or fail to improve, please contact our office for further instruction, or in case of emergency go directly to the emergency room at the closest medical facility.

## 2016-12-10 NOTE — Assessment & Plan Note (Signed)
New onset low back pain consistent with muscle strain likely related to lifting. Treat conservatively with ice/moist heat and home exercise therapy. Continue OTC medications as needed for symptom relief and supportive care. Consider imaging and physical therapy if symptoms worsen or do not improve.

## 2016-12-10 NOTE — Progress Notes (Signed)
Subjective:    Patient ID: Nathaniel Waters, male    DOB: 10/25/1955, 61 y.o.   MRN: [id number]  Chief Complaint  Patient presents with  . Back Pain    has had lower right back pain for several days, A1c check, flu shot    HPI:  Nathaniel Waters is a 61 y.o. male who  has a past medical history of Diabetes mellitus and GERD (gastroesophageal reflux disease). and presents today for a follow up office visit.  1.) Diabetes -  Currently prescribed glipizide. Reports taking the medication as prescribed and denies adverse side effects or hypoglycemic readings. Blood sugars at home have been less than 150. Does have occasional readings above 200-300.Marland Kitchen Denies new symptoms of end organ damage. No excessive hunger, thirst, or urination. Working on a low/carbohydrate modified oral intake.   Lab Results  Component Value Date   HGBA1C 11.3 (H) 12/10/2016    Lab Results  Component Value Date   CREATININE 1.04 12/10/2016   BUN 16 12/10/2016   NA 132 (L) 12/10/2016   K 4.0 12/10/2016   CL 100 12/10/2016   CO2 26 12/10/2016    2.) Back pain - This is a new problem. Associated symptom of pain located on his right side and lower back that has been going on for about 6 days. Does a lot of lifting at work as he works for [employer]. Does drink plenty of water. Pains are described and as sharp and achy and generally waxes and wanes. Denies any trauma or injury. Course of the symptoms have improved since initial onset. Modifying factors including Aleve and ibuprofen which did not help very much. No fevers, chills, change in bowels or saddle anthesia.    3.) Rash - This is a new problem. Associated symptom of a rash located on his right forearm has been going on for a couple of weeks. 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 Described as red and itchy at times and has been refractory to the modifying factor of OTC anti-itch cream. Denies spreading. 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    No Known Allergies    Outpatient Medications Prior to Visit  Medication Sig Dispense Refill  . atorvastatin (LIPITOR) 40 MG tablet TAKE 1 TABLET BY MOUTH DAILY 90 tablet 1  . Blood Glucose Monitoring Suppl (ONE TOUCH ULTRA MINI) W/DEVICE KIT 1 Device by Does not apply route once. 1 each 0  . glipiZIDE (GLUCOTROL) 10 MG tablet TAKE 1 TABLET BY MOUTH DAILY BEFORE BREAKFAST 90 tablet 1  . glucose blood (ONE TOUCH ULTRA TEST) test strip Use to check blood sugars twice a day. Annual appt is due must see MD for refills 60 each 0  . lansoprazole (PREVACID) 15 MG capsule Take 1 capsule (15 mg total) by mouth daily. Yearly physical w/labs due in March must see [name] for refills 90 capsule 0  . lisinopril (PRINIVIL,ZESTRIL) 10 MG tablet TAKE 1 TABLET BY MOUTH DAILY 90 tablet 1   Facility-Administered Medications Prior to Visit  Medication Dose Route Frequency Provider Last Rate Last Dose  . 0.9 %  sodium chloride infusion  500 mL Intravenous Continuous Pyrtle, Lajuan Lines, MD          Past Surgical History:  Procedure Laterality Date  . CARPAL TUNNEL RELEASE     both   . COLONOSCOPY        Past Medical History:  Diagnosis Date  . Diabetes  mellitus   . GERD (gastroesophageal reflux disease)       Review of Systems  Constitutional: Negative for chills and fever.  Eyes:       Negative for changes in vision.  Respiratory: Negative for chest tightness and shortness of breath.   Cardiovascular: Negative for chest pain, palpitations and leg swelling.  Endocrine: Negative for polydipsia, polyphagia and polyuria.  Musculoskeletal: Positive for back pain.  Skin: Positive for rash.  Neurological: Negative for dizziness, weakness, light-headedness and headaches.      Objective:    BP 140/90 (BP Location: Left Arm, Patient Position: Sitting, Cuff Size: Large)   Pulse 90   Temp 99 F (37.2 C) (Oral)   Resp 16   Ht '5\' 11"'$  (1.803 m)   Wt 215 lb (97.5 kg)   SpO2 96%    BMI 29.99 kg/m  Nursing note and vital signs reviewed.  Physical Exam  Constitutional: He is oriented to person, place, and time. He appears well-developed and well-nourished. No distress.  Cardiovascular: Normal rate, regular rhythm, normal heart sounds and intact distal pulses.   Pulmonary/Chest: Effort normal and breath sounds normal.  Musculoskeletal:  Back - No obvious deformity, discoloration or edema. Tenderness without creptius, deformity or spasm noted on the right quadratus lumborum and oblique abdominis. Range of motion within normal limits with discomfort noted with flexion and lateral bending. Distal pulses and sensation are intact and appropriate. Negative Fabers and straight leg raise.  Neurological: He is alert and oriented to person, place, and time.  Skin: Skin is warm and dry.  Red, blanchable macular rash located on the right forearm over the brachioradialis.   Psychiatric: He has a normal mood and affect. His behavior is normal. Judgment and thought content normal.       Assessment & Plan:   Problem List Items Addressed This Visit      Endocrine   Type II diabetes mellitus, uncontrolled ([place]) - Primary    Obtain hemoglobin A1c and urine microalbumin. Previous blood sugars are adequately controlled but notes some increased numbers recently. Diabetic foot exam completed today. Continue current dosage of glipizide. Encouraged to monitor blood sugars at home and follow reduced carbohydrate intake. Maintained on lisinopril and atorvastatin for CAD risk reduction. Follow up and changes pending A1c results if needed.       Relevant Orders   Comprehensive metabolic panel (Completed)   Hemoglobin A1c (Completed)   Urine Microalbumin w/creat. ratio (Completed)     Musculoskeletal and Integument   Rash    New onset rash appears consistent with dermatitis. Start hydrocortisone cream. Follow up if symptoms worsen or do not improve.         Other   Low back pain     New onset low back pain consistent with muscle strain likely related to lifting. Treat conservatively with ice/moist heat and home exercise therapy. Continue OTC medications as needed for symptom relief and supportive care. Consider imaging and physical therapy if symptoms worsen or do not improve.        Other Visit Diagnoses    Need for influenza vaccination       Relevant Orders   Flu Vaccine QUAD 36+ mos IM (Completed)       I am having Nathaniel Waters start on hydrocortisone. I am also having him maintain his ONE TOUCH ULTRA MINI, lansoprazole, glipiZIDE, atorvastatin, lisinopril, and glucose blood. We will continue to administer sodium chloride.   Meds ordered this encounter  Medications  . hydrocortisone  2.5 % cream    Sig: Apply topically 2 (two) times daily.    Dispense:  30 g    Refill:  0    Order Specific Question:   Supervising Provider    Answer:   Pricilla Holm A [[id number]]     Follow-up: Return in about 3 months (around 03/11/2017), or if symptoms worsen or fail to improve.  Nathaniel Po, FNP

## 2016-12-17 ENCOUNTER — Other Ambulatory Visit: Payer: Self-pay | Admitting: Family

## 2016-12-18 ENCOUNTER — Ambulatory Visit (INDEPENDENT_AMBULATORY_CARE_PROVIDER_SITE_OTHER): Payer: BLUE CROSS/BLUE SHIELD | Admitting: Family

## 2016-12-18 ENCOUNTER — Encounter: Payer: Self-pay | Admitting: Family

## 2016-12-18 VITALS — BP 160/90 | HR 96 | Temp 98.8°F | Resp 16 | Ht 71.0 in | Wt 215.0 lb

## 2016-12-18 DIAGNOSIS — E1165 Type 2 diabetes mellitus with hyperglycemia: Secondary | ICD-10-CM

## 2016-12-18 DIAGNOSIS — Z0000 Encounter for general adult medical examination without abnormal findings: Secondary | ICD-10-CM

## 2016-12-18 DIAGNOSIS — Z720 Tobacco use: Secondary | ICD-10-CM

## 2016-12-18 DIAGNOSIS — E785 Hyperlipidemia, unspecified: Secondary | ICD-10-CM | POA: Diagnosis not present

## 2016-12-18 DIAGNOSIS — IMO0001 Reserved for inherently not codable concepts without codable children: Secondary | ICD-10-CM

## 2016-12-18 DIAGNOSIS — Z716 Tobacco abuse counseling: Secondary | ICD-10-CM | POA: Diagnosis not present

## 2016-12-18 MED ORDER — VARENICLINE TARTRATE 1 MG PO TABS: 1.0000 mg | ORAL_TABLET | Freq: Two times a day (BID) | ORAL | 2 refills | Status: DC

## 2016-12-18 MED ORDER — LANSOPRAZOLE 15 MG PO CPDR: 15.0000 mg | DELAYED_RELEASE_CAPSULE | Freq: Every day | ORAL | 0 refills | Status: DC

## 2016-12-18 MED ORDER — LISINOPRIL 10 MG PO TABS: 10.0000 mg | ORAL_TABLET | Freq: Every day | ORAL | 0 refills | Status: DC

## 2016-12-18 MED ORDER — GLIPIZIDE 10 MG PO TABS
ORAL_TABLET | ORAL | 0 refills | Status: DC
Start: 2016-12-18 — End: 2017-04-09

## 2016-12-18 MED ORDER — ATORVASTATIN CALCIUM 40 MG PO TABS: 40.0000 mg | ORAL_TABLET | Freq: Every day | ORAL | 0 refills | Status: DC

## 2016-12-18 MED ORDER — VARENICLINE TARTRATE 0.5 MG X 11 & 1 MG X 42 PO MISC: ORAL | 0 refills | Status: DC

## 2016-12-18 NOTE — Assessment & Plan Note (Signed)
1) Anticipatory Guidance: Discussed importance of wearing a seatbelt while driving and not texting while driving; changing batteries in smoke detector at least once annually; wearing suntan lotion when outside; eating a balanced and moderate diet; getting physical activity at least 30 minutes per day.  2) Immunizations / Screenings / Labs:  Declines Pneumovax. Influenza up to date. All other immunizations are up to date. PSA monitored by Urology for prostate cancer screening. Colon cancer screening and Hepatitis C screening is up to date. All other screenings are up to date. Obtain CBC and lipid profile.   Overall well exam with multiple risk factors for cardiovascular and chronic disease including hyperlipidemia, Type 2 diabetes, and tobacco use. Chronic conditions are poorly controlled with current medication regimen. Discussed importance of taking mediations as prescribed and monitor blood sugars and blood pressures. Continue other healthy lifestyle behaviors and choices. Follow up prevention exam in 1 year and follow up office visit in 1 month for chronic conditions.

## 2016-12-18 NOTE — Progress Notes (Signed)
Subjective:    Patient ID: Nathaniel Waters, male    DOB: 05/30/1955, 61 y.o.   MRN: [id number]  Chief Complaint  Patient presents with  . CPE    not fasting     HPI:  Nathaniel Waters is a 61 y.o. male who presents today for an annual wellness visit.   1) Health Maintenance -   Diet - Averages about 2-3 meals per day consisting of a regular diet; Caffeine intake of about 1-2 cups per day  Exercise - Limited; job is physical; no structured exercise outside of work    2) Publishing rights manager / Immunizations:  Dental -- Due for exam   Vision -- Up to date   Health Maintenance  Topic Date Due  . Hepatitis C Screening  June 05, 1955  . PNEUMOCOCCAL POLYSACCHARIDE VACCINE (1) 03/05/1957  . OPHTHALMOLOGY EXAM  03/05/1965  . HEMOGLOBIN A1C  06/09/2017  . FOOT EXAM  12/10/2017  . COLONOSCOPY  11/20/2019  . TETANUS/TDAP  09/23/2025  . INFLUENZA VACCINE  Completed  . HIV Screening  Completed    Immunization History  Administered Date(s) Administered  . Influenza,inj,Quad PF,6+ Mos 12/10/2016  . Tdap 09/24/2015     No Known Allergies   Outpatient Medications Prior to Visit  Medication Sig Dispense Refill  . Blood Glucose Monitoring Suppl (ONE TOUCH ULTRA MINI) W/DEVICE KIT 1 Device by Does not apply route once. 1 each 0  . glucose blood (ONE TOUCH ULTRA TEST) test strip Use to check blood sugars twice a day. 100 each 3  . hydrocortisone 2.5 % cream Apply topically 2 (two) times daily. 30 g 0  . atorvastatin (LIPITOR) 40 MG tablet TAKE 1 TABLET BY MOUTH DAILY 90 tablet 1  . glipiZIDE (GLUCOTROL) 10 MG tablet TAKE 1 TABLET BY MOUTH DAILY BEFORE BREAKFAST 90 tablet 1  . lansoprazole (PREVACID) 15 MG capsule Take 1 capsule (15 mg total) by mouth daily. Yearly physical w/labs due in March must see [name] for refills 90 capsule 0  . lisinopril (PRINIVIL,ZESTRIL) 10 MG tablet TAKE 1 TABLET BY MOUTH DAILY 90 tablet 1  . empagliflozin (JARDIANCE) 10 MG TABS tablet Take 10 mg by mouth  daily. (Patient not taking: Reported on 12/18/2016) 30 tablet 2   Facility-Administered Medications Prior to Visit  Medication Dose Route Frequency Provider Last Rate Last Dose  . 0.9 %  sodium chloride infusion  500 mL Intravenous Continuous Pyrtle, Lajuan Lines, MD         Past Medical History:  Diagnosis Date  . Diabetes mellitus   . GERD (gastroesophageal reflux disease)      Past Surgical History:  Procedure Laterality Date  . CARPAL TUNNEL RELEASE     both   . COLONOSCOPY       Family History  Problem Relation Age of Onset  . Breast cancer Mother   . Heart disease Brother   . Lung cancer Father   . Healthy Paternal Grandmother   . Cancer Brother   . Colon cancer Neg Hx   . Esophageal cancer Neg Hx   . Stomach cancer Neg Hx   . Rectal cancer Neg Hx      Social History   Social History  . Marital status: Legally Separated    Spouse name: N/A  . Number of children: 4  . Years of education: 60   Occupational History  . [employer]    Social History Main Topics  . Smoking status: Current Every Day Smoker    Packs/day:  0.25  . Smokeless tobacco: Never Used  . Alcohol use Yes     Comment: occasionally on weekend  . Drug use: No  . Sexual activity: Not on file   Other Topics Concern  . Not on file   Social History Narrative   Fun: Listen to music and be around family and friends.   Denies religious beliefs effecting health care.       Review of Systems  Constitutional: Denies fever, chills, fatigue, or significant weight gain/loss. HENT: Head: Denies headache or neck pain Ears: Denies changes in hearing, ringing in ears, earache, drainage Nose: Denies discharge, stuffiness, itching, nosebleed, sinus pain Throat: Denies sore throat, hoarseness, dry mouth, sores, thrush Eyes: Denies loss/changes in vision, pain, redness, blurry/double vision, flashing lights Cardiovascular: Denies chest pain/discomfort, tightness, palpitations, shortness of breath with  activity, difficulty lying down, swelling, sudden awakening with shortness of breath Respiratory: Denies shortness of breath, cough, sputum production, wheezing Gastrointestinal: Denies dysphasia, heartburn, change in appetite, nausea, change in bowel habits, rectal bleeding, constipation, diarrhea, yellow skin or eyes Genitourinary: Denies frequency, urgency, burning/pain, blood in urine, incontinence, change in urinary strength. Musculoskeletal: Denies muscle/joint pain, stiffness, back pain, redness or swelling of joints, trauma Skin: Denies rashes, lumps, itching, dryness, color changes, or hair/nail changes Neurological: Denies dizziness, fainting, seizures, weakness, numbness, tingling, tremor Psychiatric - Denies nervousness, stress, depression or memory loss Endocrine: Denies heat or cold intolerance, sweating, frequent urination, excessive thirst, changes in appetite Hematologic: Denies ease of bruising or bleeding     Objective:     BP (!) 160/90 (BP Location: Left Arm, Patient Position: Sitting, Cuff Size: Large)   Pulse 96   Temp 98.8 F (37.1 C) (Oral)   Resp 16   Ht '5\' 11"'  (1.803 m)   Wt 215 lb (97.5 kg)   SpO2 97%   BMI 29.99 kg/m  Nursing note and vital signs reviewed.  Physical Exam  Constitutional: He is oriented to person, place, and time. He appears well-developed and well-nourished.  HENT:  Head: Normocephalic.  Right Ear: Hearing, tympanic membrane, external ear and ear canal normal.  Left Ear: Hearing, tympanic membrane, external ear and ear canal normal.  Nose: Nose normal.  Mouth/Throat: Uvula is midline, oropharynx is clear and moist and mucous membranes are normal.  Eyes: Pupils are equal, round, and reactive to light. Conjunctivae and EOM are normal.  Neck: Neck supple. No JVD present. No tracheal deviation present. No thyromegaly present.  Cardiovascular: Normal rate, regular rhythm, normal heart sounds and intact distal pulses.   Pulmonary/Chest:  Effort normal and breath sounds normal.  Abdominal: Soft. Bowel sounds are normal. He exhibits no distension and no mass. There is no tenderness. There is no rebound and no guarding.  Musculoskeletal: Normal range of motion. He exhibits no edema or tenderness.  Lymphadenopathy:    He has no cervical adenopathy.  Neurological: He is alert and oriented to person, place, and time. He has normal reflexes. No cranial nerve deficit. He exhibits normal muscle tone. Coordination normal.  Skin: Skin is warm and dry.  Psychiatric: He has a normal mood and affect. His behavior is normal. Judgment and thought content normal.       Assessment & Plan:   Problem List Items Addressed This Visit      Endocrine   Type II diabetes mellitus, uncontrolled ([place])    Type 2 diabetes remains poorly controlled. Continue current dosage of Jardiance and glipizide. Monitor blood sugars at home and follow/modified carbohydrate diet. Follow-up  in 3 months for A1c check.      Relevant Medications   glipiZIDE (GLUCOTROL) 10 MG tablet   lisinopril (PRINIVIL,ZESTRIL) 10 MG tablet   atorvastatin (LIPITOR) 40 MG tablet     Other   Routine general medical examination at a health care facility - Primary    1) Anticipatory Guidance: Discussed importance of wearing a seatbelt while driving and not texting while driving; changing batteries in smoke detector at least once annually; wearing suntan lotion when outside; eating a balanced and moderate diet; getting physical activity at least 30 minutes per day.  2) Immunizations / Screenings / Labs:  Declines Pneumovax. Influenza up to date. All other immunizations are up to date. PSA monitored by Urology for prostate cancer screening. Colon cancer screening and Hepatitis C screening is up to date. All other screenings are up to date. Obtain CBC and lipid profile.   Overall well exam with multiple risk factors for cardiovascular and chronic disease including hyperlipidemia, Type  2 diabetes, and tobacco use. Chronic conditions are poorly controlled with current medication regimen. Discussed importance of taking mediations as prescribed and monitor blood sugars and blood pressures. Continue other healthy lifestyle behaviors and choices. Follow up prevention exam in 1 year and follow up office visit in 1 month for chronic conditions.       Relevant Orders   CBC   Lipid Profile   Encounter for smoking cessation counseling    Continues to smoke approximately one quarter pack per day. Encouraged tobacco cessation to reduce risk for cardiovascular, respiratory malignancies in the future. Start Chantix. Patient is in the precontemplation/action stage. Continue to monitor. Resources provided and after visit summary.      Dyslipidemia    Currently maintained on atorvastatin with no adverse side effects or myalgias. Obtain lipid profile. Continue current dosage of atorvastatin pending lipid profile results.      Relevant Medications   atorvastatin (LIPITOR) 40 MG tablet       I have changed Mr. Bal's lisinopril, lansoprazole, and atorvastatin. I am also having him start on varenicline and varenicline. Additionally, I am having him maintain his ONE TOUCH ULTRA MINI, hydrocortisone, empagliflozin, glucose blood, and glipiZIDE. We will continue to administer sodium chloride.   Meds ordered this encounter  Medications  . glipiZIDE (GLUCOTROL) 10 MG tablet    Sig: TAKE 1 TABLET BY MOUTH DAILY BEFORE BREAKFAST    Dispense:  90 tablet    Refill:  0    Order Specific Question:   Supervising Provider    Answer:   Pricilla Holm A [[id number]]  . lisinopril (PRINIVIL,ZESTRIL) 10 MG tablet    Sig: Take 1 tablet (10 mg total) by mouth daily.    Dispense:  30 tablet    Refill:  0    Order Specific Question:   Supervising Provider    Answer:   Pricilla Holm A [[id number]]  . lansoprazole (PREVACID) 15 MG capsule    Sig: Take 1 capsule (15 mg total) by mouth daily.    Dispense:   90 capsule    Refill:  0    Order Specific Question:   Supervising Provider    Answer:   Pricilla Holm A [[id number]]  . atorvastatin (LIPITOR) 40 MG tablet    Sig: Take 1 tablet (40 mg total) by mouth daily.    Dispense:  30 tablet    Refill:  0    Order Specific Question:   Supervising Provider    Answer:   Pricilla Holm  A [[id number]]  . varenicline (CHANTIX STARTING MONTH PAK) 0.5 MG X 11 & 1 MG X 42 tablet    Sig: Take one 0.5 mg tablet by mouth once daily for 3 days, then increase to one 0.5 mg tablet twice daily for 4 days, then increase to one 1 mg tablet twice daily.    Dispense:  53 tablet    Refill:  0    Order Specific Question:   Supervising Provider    Answer:   Pricilla Holm A [[id number]]  . varenicline (CHANTIX CONTINUING MONTH PAK) 1 MG tablet    Sig: Take 1 tablet (1 mg total) by mouth 2 (two) times daily.    Dispense:  60 tablet    Refill:  2    Order Specific Question:   Supervising Provider    Answer:   Pricilla Holm A [[id number]]     Follow-up: Return in about 1 month (around 01/17/2017), or if symptoms worsen or fail to improve.   Mauricio Po, FNP

## 2016-12-18 NOTE — Assessment & Plan Note (Signed)
Currently maintained on atorvastatin with no adverse side effects or myalgias. Obtain lipid profile. Continue current dosage of atorvastatin pending lipid profile results.

## 2016-12-18 NOTE — Assessment & Plan Note (Signed)
Continues to smoke approximately one quarter pack per day. Encouraged tobacco cessation to reduce risk for cardiovascular, respiratory malignancies in the future. Start Chantix. Patient is in the precontemplation/action stage. Continue to monitor. Resources provided and after visit summary.

## 2016-12-18 NOTE — Patient Instructions (Signed)
Thank you for choosing [facility].  SUMMARY AND INSTRUCTIONS:  Please continue to take your medications as prescribed.  Continue to monitor your blood pressure and blood sugars at home.   Follow up in 1 month or sooner.   Work on decreasing saturated fats and processed sugary foods and fast foods.   Medication:  Your prescription(s) have been submitted to your pharmacy or been printed and provided for you. Please take as directed and contact our office if you believe you are having problem(s) with the medication(s) or have any questions.  Labs:  Please stop by the lab on the lower level of the building for your blood work. Your results will be released to [facility] (or called to you) after review, usually within 72 hours after test completion. If any changes need to be made, you will be notified at that same time.  1.) The lab is open from 7:30am to 5:30 pm Monday-Friday 2.) No appointment is necessary 3.) Fasting (if needed) is 6-8 hours after food and drink; black coffee and water are okay   Follow up:  If your symptoms worsen or fail to improve, please contact our office for further instruction, or in case of emergency go directly to the emergency room at the closest medical facility.    Health Maintenance, Male A healthy lifestyle and preventive care is important for your health and wellness. Ask your health care provider about what schedule of regular examinations is right for you. What should I know about weight and diet? Eat a Healthy Diet  Eat plenty of vegetables, fruits, whole grains, low-fat dairy products, and lean protein.  Do not eat a lot of foods high in solid fats, added sugars, or salt.  Maintain a Healthy Weight Regular exercise can help you achieve or maintain a healthy weight. You should:  Do at least 150 minutes of exercise each week. The exercise should increase your heart rate and make you sweat (moderate-intensity exercise).  Do  strength-training exercises at least twice a week.  Watch Your Levels of Cholesterol and Blood Lipids  Have your blood tested for lipids and cholesterol every 5 years starting at 62 years of age. If you are at high risk for heart disease, you should start having your blood tested when you are 62 years old. You may need to have your cholesterol levels checked more often if: ? Your lipid or cholesterol levels are high. ? You are older than 62 years of age. ? You are at high risk for heart disease.  What should I know about cancer screening? Many types of cancers can be detected early and may often be prevented. Lung Cancer  You should be screened every year for lung cancer if: ? You are a current smoker who has smoked for at least 30 years. ? You are a former smoker who has quit within the past 15 years.  Talk to your health care provider about your screening options, when you should start screening, and how often you should be screened.  Colorectal Cancer  Routine colorectal cancer screening usually begins at 62 years of age and should be repeated every 5-10 years until you are 62 years old. You may need to be screened more often if early forms of precancerous polyps or small growths are found. Your health care provider may recommend screening at an earlier age if you have risk factors for colon cancer.  Your health care provider may recommend using home test kits to check for hidden blood  in the stool.  A small camera at the end of a tube can be used to examine your colon (sigmoidoscopy or colonoscopy). This checks for the earliest forms of colorectal cancer.  Prostate and Testicular Cancer  Depending on your age and overall health, your health care provider may do certain tests to screen for prostate and testicular cancer.  Talk to your health care provider about any symptoms or concerns you have about testicular or prostate cancer.  Skin Cancer  Check your skin from head to toe  regularly.  Tell your health care provider about any new moles or changes in moles, especially if: ? There is a change in a mole's size, shape, or color. ? You have a mole that is larger than a pencil eraser.  Always use sunscreen. Apply sunscreen liberally and repeat throughout the day.  Protect yourself by wearing long sleeves, pants, a wide-brimmed hat, and sunglasses when outside.  What should I know about heart disease, diabetes, and high blood pressure?  If you are 25-61 years of age, have your blood pressure checked every 3-5 years. If you are 34 years of age or older, have your blood pressure checked every year. You should have your blood pressure measured twice-once when you are at a hospital or clinic, and once when you are not at a hospital or clinic. Record the average of the two measurements. To check your blood pressure when you are not at a hospital or clinic, you can use: ? An automated blood pressure machine at a pharmacy. ? A home blood pressure monitor.  Talk to your health care provider about your target blood pressure.  If you are between 70-43 years old, ask your health care provider if you should take aspirin to prevent heart disease.  Have regular diabetes screenings by checking your fasting blood sugar level. ? If you are at a normal weight and have a low risk for diabetes, have this test once every three years after the age of 79. ? If you are overweight and have a high risk for diabetes, consider being tested at a younger age or more often.  A one-time screening for abdominal aortic aneurysm (AAA) by ultrasound is recommended for men aged 80-75 years who are current or former smokers. What should I know about preventing infection? Hepatitis B If you have a higher risk for hepatitis B, you should be screened for this virus. Talk with your health care provider to find out if you are at risk for hepatitis B infection. Hepatitis C Blood testing is recommended  for:  Everyone born from [birth year] through 1966.  Anyone with known risk factors for hepatitis C.  Sexually Transmitted Diseases (STDs)  You should be screened each year for STDs including gonorrhea and chlamydia if: ? You are sexually active and are younger than 62 years of age. ? You are older than 62 years of age and your health care provider tells you that you are at risk for this type of infection. ? Your sexual activity has changed since you were last screened and you are at an increased risk for chlamydia or gonorrhea. Ask your health care provider if you are at risk.  Talk with your health care provider about whether you are at high risk of being infected with HIV. Your health care provider may recommend a prescription medicine to help prevent HIV infection.  What else can I do?  Schedule regular health, dental, and eye exams.  Stay current with your vaccines (immunizations).  Do not use any tobacco products, such as cigarettes, chewing tobacco, and e-cigarettes. If you need help quitting, ask your health care provider.  Limit alcohol intake to no more than 2 drinks per day. One drink equals 12 ounces of beer, 5 ounces of wine, or 1 ounces of hard liquor.  Do not use street drugs.  Do not share needles.  Ask your health care provider for help if you need support or information about quitting drugs.  Tell your health care provider if you often feel depressed.  Tell your health care provider if you have ever been abused or do not feel safe at home. This information is not intended to replace advice given to you by your health care provider. Make sure you discuss any questions you have with your health care provider. Document Released: 09/20/2007 Document Revised: 11/21/2015 Document Reviewed: 12/26/2014 Elsevier Interactive Patient Education  [employer].

## 2016-12-18 NOTE — Assessment & Plan Note (Signed)
Type 2 diabetes remains poorly controlled. Continue current dosage of Jardiance and glipizide. Monitor blood sugars at home and follow/modified carbohydrate diet. Follow-up in 3 months for A1c check.

## 2016-12-26 ENCOUNTER — Other Ambulatory Visit (INDEPENDENT_AMBULATORY_CARE_PROVIDER_SITE_OTHER): Payer: BLUE CROSS/BLUE SHIELD

## 2016-12-26 DIAGNOSIS — Z0000 Encounter for general adult medical examination without abnormal findings: Secondary | ICD-10-CM

## 2016-12-26 LAB — LIPID PANEL
CHOL/HDL RATIO: 5
CHOLESTEROL: 158 mg/dL (ref 0–200)
HDL: 34.4 mg/dL — ABNORMAL LOW (ref >39.00)
LDL CALC: 107 mg/dL — AB (ref 0–99)
NonHDL: 123.66
TRIGLYCERIDES: 83 mg/dL (ref 0.0–149.0)
VLDL: 16.6 mg/dL (ref 0.0–40.0)

## 2016-12-26 LAB — CBC
HCT: 47.3 % (ref 39.0–52.0)
Hemoglobin: 16.1 g/dL (ref 13.0–17.0)
MCHC: 34 g/dL (ref 30.0–36.0)
MCV: 93.9 fl (ref 78.0–100.0)
PLATELETS: 174 10*3/uL (ref 150.0–400.0)
RBC: 5.04 Mil/uL (ref 4.22–5.81)
RDW: 12.9 % (ref 11.5–15.5)
WBC: 7.5 10*3/uL (ref 4.0–10.5)

## 2016-12-27 ENCOUNTER — Encounter: Payer: Self-pay | Admitting: Family Medicine

## 2016-12-27 ENCOUNTER — Ambulatory Visit (INDEPENDENT_AMBULATORY_CARE_PROVIDER_SITE_OTHER): Payer: BLUE CROSS/BLUE SHIELD | Admitting: Family Medicine

## 2016-12-27 VITALS — BP 140/78 | HR 93 | Temp 98.1°F | Wt 210.0 lb

## 2016-12-27 DIAGNOSIS — L509 Urticaria, unspecified: Secondary | ICD-10-CM | POA: Diagnosis not present

## 2016-12-27 DIAGNOSIS — L270 Generalized skin eruption due to drugs and medicaments taken internally: Secondary | ICD-10-CM | POA: Diagnosis not present

## 2016-12-27 MED ORDER — METHYLPREDNISOLONE ACETATE 80 MG/ML IJ SUSP
80.0000 mg | Freq: Once | INTRAMUSCULAR | Status: AC
  Administered 2016-12-27: 80 mg via INTRAMUSCULAR

## 2016-12-27 NOTE — Patient Instructions (Signed)
Start taking your zytec 10mg  once daily. Stop Jardiance. In 1 week, restart jardiance and see if rash returns.

## 2016-12-27 NOTE — Progress Notes (Signed)
OFFICE VISIT  12/27/2016   CC:  Chief Complaint  Patient presents with  . Rash    x 2 days... noticed on arms then spread to back and leg... very itchy and swelling...pt has tried OTC hydrortisone cream with no relief   HPI:    Patient is a 61 y.o.  male who presents for itch. Onset about 4 d/a, itching started on R arm, then next day L hand on top.  He itches and then notes some redness/slight swelling of skin.  This began about 6 days after starting jardiance.  No dietary changes and no contact with irritants/plants/vines. No insect bite recalled.  He applied hydrocortisone and this helped a little.  He has not been taking any antihistamine. No SOB, wheeze, eye or tongue swelling, or throat swelling. Rash seems to wax/wane in appearance.  Past Medical History:  Diagnosis Date  . Diabetes mellitus   . GERD (gastroesophageal reflux disease)     Past Surgical History:  Procedure Laterality Date  . CARPAL TUNNEL RELEASE     both   . COLONOSCOPY      Outpatient Medications Prior to Visit  Medication Sig Dispense Refill  . atorvastatin (LIPITOR) 40 MG tablet Take 1 tablet (40 mg total) by mouth daily. 30 tablet 0  . Blood Glucose Monitoring Suppl (ONE TOUCH ULTRA MINI) W/DEVICE KIT 1 Device by Does not apply route once. 1 each 0  . empagliflozin (JARDIANCE) 10 MG TABS tablet Take 10 mg by mouth daily. 30 tablet 2  . glipiZIDE (GLUCOTROL) 10 MG tablet TAKE 1 TABLET BY MOUTH DAILY BEFORE BREAKFAST 90 tablet 0  . glucose blood (ONE TOUCH ULTRA TEST) test strip Use to check blood sugars twice a day. 100 each 3  . hydrocortisone 2.5 % cream Apply topically 2 (two) times daily. 30 g 0  . lansoprazole (PREVACID) 15 MG capsule Take 1 capsule (15 mg total) by mouth daily. 90 capsule 0  . lisinopril (PRINIVIL,ZESTRIL) 10 MG tablet Take 1 tablet (10 mg total) by mouth daily. 30 tablet 0  . varenicline (CHANTIX CONTINUING MONTH PAK) 1 MG tablet Take 1 tablet (1 mg total) by mouth 2 (two)  times daily. 60 tablet 2  . varenicline (CHANTIX STARTING MONTH PAK) 0.5 MG X 11 & 1 MG X 42 tablet Take one 0.5 mg tablet by mouth once daily for 3 days, then increase to one 0.5 mg tablet twice daily for 4 days, then increase to one 1 mg tablet twice daily. 53 tablet 0   Facility-Administered Medications Prior to Visit  Medication Dose Route Frequency Provider Last Rate Last Dose  . 0.9 %  sodium chloride infusion  500 mL Intravenous Continuous Pyrtle, Lajuan Lines, MD        No Known Allergies  ROS As per HPI  PE: Blood pressure 140/78, pulse 93, temperature 98.1 F (36.7 C), temperature source Oral, weight 210 lb (95.3 kg), SpO2 96 %. Gen: Alert, well appearing.  Patient is oriented to person, place, time, and situation. AFFECT: pleasant, lucid thought and speech. ENT: no swelling of face/eyes/lips/tongue/throat. SKIN: mildly erythematous, barely palpable rash, splotches on posteromedial aspect of L elbow, medial aspect of L knee, L triceps region, L flank.  These areas have more warmth than skin w/out rash.  No vesicles, pustules, ecchymoses, or petechiae.  No areas of tenderness.  LABS:    Chemistry      Component Value Date/Time   NA 132 (L) 12/10/2016 1000   K 4.0 12/10/2016 1000  CL 100 12/10/2016 1000   CO2 26 12/10/2016 1000   BUN 16 12/10/2016 1000   CREATININE 1.04 12/10/2016 1000      Component Value Date/Time   CALCIUM 9.5 12/10/2016 1000   ALKPHOS 119 (H) 12/10/2016 1000   AST 22 12/10/2016 1000   ALT 30 12/10/2016 1000   BILITOT 0.6 12/10/2016 1000      IMPRESSION AND PLAN:  Urticaria; unknown etiology. Idiopathic vs allergic rxn to Jardiance that he recently started. Stop jardiance, restart this med in 1 week and see if rash returns. Depo-medrol 80 mg IM today.  Take zyrtec 59m qd.  An After Visit Summary was printed and given to the patient.  FOLLOW UP: Return if symptoms worsen or fail to improve.  Signed:  PCrissie Sickles MD            12/27/2016

## 2016-12-31 ENCOUNTER — Ambulatory Visit (INDEPENDENT_AMBULATORY_CARE_PROVIDER_SITE_OTHER): Payer: BLUE CROSS/BLUE SHIELD | Admitting: Family

## 2016-12-31 ENCOUNTER — Encounter: Payer: Self-pay | Admitting: Family

## 2016-12-31 DIAGNOSIS — IMO0001 Reserved for inherently not codable concepts without codable children: Secondary | ICD-10-CM

## 2016-12-31 DIAGNOSIS — E1165 Type 2 diabetes mellitus with hyperglycemia: Secondary | ICD-10-CM

## 2016-12-31 MED ORDER — DAPAGLIFLOZIN PROPANEDIOL 10 MG PO TABS: 10.0000 mg | ORAL_TABLET | Freq: Every day | ORAL | 2 refills | Status: DC

## 2016-12-31 NOTE — Progress Notes (Signed)
Subjective:    Patient ID: Nathaniel Waters, male    DOB: 1956/01/10, 61 y.o.   MRN: [id number]  Chief Complaint  Patient presents with  . Follow-up    lab work follow up     HPI:  Nathaniel Waters is a 61 y.o. male who  has a past medical history of Diabetes mellitus and GERD (gastroesophageal reflux disease). and presents today for a follow up office visit.  1.) Diabetes - Currently prescribed glipizide. Reports taking the medication as prescribed and denies adverse side effects. Recently evaluated in the office for a rash with questionable association with previously prescribed Jardiance. He has not had a rash since stopping the medication, however indicates he would like to try another similar medication as his blood sugars were greatly improved with the Jardiance. Rash has completed resolved.   Lab Results  Component Value Date   HGBA1C 11.3 (H) 12/10/2016    Allergies  Allergen Reactions  . Jardiance [Empagliflozin] Rash      Outpatient Medications Prior to Visit  Medication Sig Dispense Refill  . atorvastatin (LIPITOR) 40 MG tablet Take 1 tablet (40 mg total) by mouth daily. 30 tablet 0  . Blood Glucose Monitoring Suppl (ONE TOUCH ULTRA MINI) W/DEVICE KIT 1 Device by Does not apply route once. 1 each 0  . glipiZIDE (GLUCOTROL) 10 MG tablet TAKE 1 TABLET BY MOUTH DAILY BEFORE BREAKFAST 90 tablet 0  . glucose blood (ONE TOUCH ULTRA TEST) test strip Use to check blood sugars twice a day. 100 each 3  . hydrocortisone 2.5 % cream Apply topically 2 (two) times daily. 30 g 0  . lansoprazole (PREVACID) 15 MG capsule Take 1 capsule (15 mg total) by mouth daily. 90 capsule 0  . lisinopril (PRINIVIL,ZESTRIL) 10 MG tablet Take 1 tablet (10 mg total) by mouth daily. 30 tablet 0  . varenicline (CHANTIX CONTINUING MONTH PAK) 1 MG tablet Take 1 tablet (1 mg total) by mouth 2 (two) times daily. 60 tablet 2  . varenicline (CHANTIX STARTING MONTH PAK) 0.5 MG X 11 & 1 MG X 42 tablet Take one 0.5 mg  tablet by mouth once daily for 3 days, then increase to one 0.5 mg tablet twice daily for 4 days, then increase to one 1 mg tablet twice daily. 53 tablet 0  . empagliflozin (JARDIANCE) 10 MG TABS tablet Take 10 mg by mouth daily. 30 tablet 2   Facility-Administered Medications Prior to Visit  Medication Dose Route Frequency Provider Last Rate Last Dose  . 0.9 %  sodium chloride infusion  500 mL Intravenous Continuous Pyrtle, Lajuan Lines, MD         Past Medical History:  Diagnosis Date  . Diabetes mellitus   . GERD (gastroesophageal reflux disease)       Review of Systems  Constitutional: Negative for chills and fever.  Eyes:       Negative for changes in vision.  Respiratory: Negative for chest tightness and shortness of breath.   Cardiovascular: Negative for chest pain, palpitations and leg swelling.  Endocrine: Negative for polydipsia, polyphagia and polyuria.  Skin: Negative for rash.  Neurological: Negative for dizziness, weakness, light-headedness and headaches.      Objective:    BP 128/82 (BP Location: Left Arm, Patient Position: Sitting, Cuff Size: Large)   Pulse 100   Temp 98.8 F (37.1 C) (Oral)   Resp 16   Ht '5\' 11"'  (1.803 m)   Wt 212 lb (96.2 kg)   SpO2 98%  BMI 29.57 kg/m  Nursing note and vital signs reviewed.  Physical Exam  Constitutional: He is oriented to person, place, and time. He appears well-developed and well-nourished. No distress.  Cardiovascular: Normal rate, regular rhythm, normal heart sounds and intact distal pulses.  Exam reveals no gallop and no friction rub.   No murmur heard. Pulmonary/Chest: Effort normal and breath sounds normal. No respiratory distress. He has no wheezes. He has no rales. He exhibits no tenderness.  Neurological: He is alert and oriented to person, place, and time.  Skin: Skin is warm and dry. No rash noted.  Psychiatric: He has a normal mood and affect. His behavior is normal. Judgment and thought content normal.        Assessment & Plan:   Problem List Items Addressed This Visit      Endocrine   Type II diabetes mellitus, uncontrolled ([place])    Type 2 diabetes with improved control with addition of SGLT-2 inhibitor although reaction to Jardiance. Trial Farxiga. Continue current dosage of glipizide. Encouraged to monitor blood sugars at home and following a low/modified carbohydrate intake. Continue to work on physical activity. Follow-up in 3 months or sooner if needed.      Relevant Medications   dapagliflozin propanediol (FARXIGA) 10 MG TABS tablet       I have discontinued Mr. Top's empagliflozin. I am also having him start on dapagliflozin propanediol. Additionally, I am having him maintain his ONE TOUCH ULTRA MINI, hydrocortisone, glucose blood, glipiZIDE, lisinopril, lansoprazole, atorvastatin, varenicline, and varenicline. We will continue to administer sodium chloride.   Meds ordered this encounter  Medications  . dapagliflozin propanediol (FARXIGA) 10 MG TABS tablet    Sig: Take 10 mg by mouth daily.    Dispense:  30 tablet    Refill:  2    Order Specific Question:   Supervising Provider    Answer:   Pricilla Holm A [[id number]]     Follow-up: Return in about 3 months (around 04/01/2017), or if symptoms worsen or fail to improve.  Mauricio Po, FNP

## 2016-12-31 NOTE — Patient Instructions (Signed)
Thank you for choosing [facility].  SUMMARY AND INSTRUCTIONS:  Please continue to take your medication as prescribed.  We will trial [place].   Continue to monitor your blood sugars at home.   Medication:  Your prescription(s) have been submitted to your pharmacy or been printed and provided for you. Please take as directed and contact our office if you believe you are having problem(s) with the medication(s) or have any questions.  Follow up:  If your symptoms worsen or fail to improve, please contact our office for further instruction, or in case of emergency go directly to the emergency room at the closest medical facility.

## 2016-12-31 NOTE — Assessment & Plan Note (Signed)
Type 2 diabetes with improved control with addition of SGLT-2 inhibitor although reaction to Jardiance. Trial Farxiga. Continue current dosage of glipizide. Encouraged to monitor blood sugars at home and following a low/modified carbohydrate intake. Continue to work on physical activity. Follow-up in 3 months or sooner if needed.

## 2017-01-27 ENCOUNTER — Other Ambulatory Visit: Payer: Self-pay | Admitting: *Deleted

## 2017-01-27 MED ORDER — LISINOPRIL 10 MG PO TABS: 10.0000 mg | ORAL_TABLET | Freq: Every day | ORAL | 0 refills | Status: DC

## 2017-02-04 ENCOUNTER — Telehealth: Payer: Self-pay | Admitting: Nurse Practitioner

## 2017-02-04 MED ORDER — DAPAGLIFLOZIN PROPANEDIOL 10 MG PO TABS: ORAL_TABLET | ORAL | 2 refills | Status: DC

## 2017-02-04 MED ORDER — GLUCOSE BLOOD VI STRP: ORAL_STRIP | 2 refills | Status: DC

## 2017-02-04 NOTE — Telephone Encounter (Signed)
Reviewed chart pt is up-to-date sent refills to [facility].../lmb  

## 2017-02-04 NOTE — Telephone Encounter (Signed)
glucose blood (ONE TOUCH ULTRA TEST) test strip  dapagliflozin propanediol (FARXIGA) 10 MG TABS tablet   [id number] [place], [place] - [address] AT [address] [phone] (Phone) [phone] (Fax)   Patient is requesting a refill on these medication. He has an est care appointment on 04/09/17 with [facility]. Please advise. Thank you.

## 2017-02-19 ENCOUNTER — Other Ambulatory Visit: Payer: Self-pay

## 2017-02-19 MED ORDER — LISINOPRIL 10 MG PO TABS: 10.0000 mg | ORAL_TABLET | Freq: Every day | ORAL | 0 refills | Status: DC

## 2017-03-23 ENCOUNTER — Telehealth: Payer: Self-pay | Admitting: Nurse Practitioner

## 2017-03-23 ENCOUNTER — Other Ambulatory Visit: Payer: Self-pay | Admitting: Family

## 2017-03-23 NOTE — Telephone Encounter (Signed)
Copied from [place] [phone]. Topic: Quick Communication - See Telephone Encounter >> Mar 23, 2017 11:51 AM Corie Chiquito, NT wrote: CRM for notification. See Telephone encounter for: Patient calling because he needs a refill on his Lansoprazole. If someone could please give him a call back at [phone]  03/23/17.

## 2017-03-24 ENCOUNTER — Other Ambulatory Visit: Payer: Self-pay

## 2017-03-24 MED ORDER — LANSOPRAZOLE 15 MG PO CPDR: 15.0000 mg | DELAYED_RELEASE_CAPSULE | Freq: Every day | ORAL | 0 refills | Status: DC

## 2017-03-25 ENCOUNTER — Other Ambulatory Visit: Payer: Self-pay | Admitting: *Deleted

## 2017-03-25 MED ORDER — LANSOPRAZOLE 15 MG PO CPDR: 15.0000 mg | DELAYED_RELEASE_CAPSULE | Freq: Every day | ORAL | 0 refills | Status: DC

## 2017-04-09 ENCOUNTER — Ambulatory Visit: Payer: BLUE CROSS/BLUE SHIELD | Admitting: Nurse Practitioner

## 2017-04-09 ENCOUNTER — Encounter: Payer: Self-pay | Admitting: Nurse Practitioner

## 2017-04-09 VITALS — BP 150/92 | HR 81 | Temp 98.4°F | Resp 16 | Ht 71.0 in | Wt 224.0 lb

## 2017-04-09 DIAGNOSIS — I10 Essential (primary) hypertension: Secondary | ICD-10-CM

## 2017-04-09 DIAGNOSIS — F17219 Nicotine dependence, cigarettes, with unspecified nicotine-induced disorders: Secondary | ICD-10-CM | POA: Diagnosis not present

## 2017-04-09 DIAGNOSIS — E785 Hyperlipidemia, unspecified: Secondary | ICD-10-CM | POA: Diagnosis not present

## 2017-04-09 DIAGNOSIS — E1165 Type 2 diabetes mellitus with hyperglycemia: Secondary | ICD-10-CM | POA: Diagnosis not present

## 2017-04-09 DIAGNOSIS — E1159 Type 2 diabetes mellitus with other circulatory complications: Secondary | ICD-10-CM | POA: Insufficient documentation

## 2017-04-09 MED ORDER — ATORVASTATIN CALCIUM 40 MG PO TABS: 40.0000 mg | ORAL_TABLET | Freq: Every day | ORAL | 2 refills | Status: DC

## 2017-04-09 MED ORDER — AMLODIPINE BESYLATE 5 MG PO TABS: 5.0000 mg | ORAL_TABLET | Freq: Every day | ORAL | 1 refills | Status: DC

## 2017-04-09 MED ORDER — LISINOPRIL 10 MG PO TABS: 10.0000 mg | ORAL_TABLET | Freq: Every day | ORAL | 1 refills | Status: DC

## 2017-04-09 NOTE — Progress Notes (Signed)
Subjective:    Patient ID: Nathaniel Waters, male    DOB: Aug 30, 1955, 62 y.o.   MRN: [id number]  HPI Nathaniel Waters is a 62 yo male Who presents today to establish care. He Is transferring to me from another provider in the same clinic. He Has the following significant medical problems: GERD, type 2 diabetes mellitus, erectile dysfunction, dyslipidemia, elevated PSA , hypertension.  Diabetes- maintained on farxiga 10, he takes it daily and tolerates well. He stopped taking his glipizide because he thought it was not helping his A1c. He checks home blood sugar readings daily-checks every morning- readings usually 90-115 fasting in the AM. Denies tremor, diaphoresis, polyuria, polydipsia, polyphagia. Eye exam is not up to date but plans to go.  Lab Results  Component Value Date   HGBA1C 11.3 (H) 12/10/2016   Cholesterol- maintained on atorvastatin. He did run out about one month ago due to no refills, he would like a refill today. Reports he has tolerated the atorvastatin well without any adverse effects, including myalgias. Diet- he does not follow a good diet,does eat some veggies daily. Exercise- labor intensive job, occasionally works.  Lab Results  Component Value Date   CHOL 158 12/26/2016   HDL 34.40 (L) 12/26/2016   LDLCALC 107 (H) 12/26/2016   LDLDIRECT 113.0 09/21/2014   TRIG 83.0 12/26/2016   CHOLHDL 5 12/26/2016    Hypertension -maintained on lisinopril 10. Reports daily medication compliance without adverse medication effects.  Reports occasionally checks blood pressure at drug store-140s/80s. Denies headaches, chest pain, shortness of breath, edema. He does have occasional blurred vision, improves when he wears his reading glasses.  BP Readings from Last 3 Encounters:  04/09/17 (!) 150/92  12/31/16 128/82  12/27/16 140/78   Tobacco use-He is a current smoker-about 1 pack per week. He did not fill his chantix prescription because he was worried about the side effects.  He is planning to go cold [place] in the future but hes not ready yet.  Review of Systems  See HPI  Past Medical History:  Diagnosis Date  . Diabetes mellitus   . GERD (gastroesophageal reflux disease)      Social History   Socioeconomic History  . Marital status: Legally Separated    Spouse name: Not on file  . Number of children: 4  . Years of education: 70  . Highest education level: Not on file  Social Needs  . Financial resource strain: Not on file  . Food insecurity - worry: Not on file  . Food insecurity - inability: Not on file  . Transportation needs - medical: Not on file  . Transportation needs - non-medical: Not on file  Occupational History  . Occupation: Loading [employer]  Tobacco Use  . Smoking status: Current Every Day Smoker    Packs/day: 0.25  . Smokeless tobacco: Never Used  Substance and Sexual Activity  . Alcohol use: Yes    Comment: occasionally on weekend  . Drug use: No  . Sexual activity: Not on file  Other Topics Concern  . Not on file  Social History Narrative   Fun: Listen to music and be around family and friends.   Denies religious beliefs effecting health care.     Past Surgical History:  Procedure Laterality Date  . CARPAL TUNNEL RELEASE     both   . COLONOSCOPY      Family History  Problem Relation Age of Onset  . Breast cancer Mother   . Heart disease Brother   .  Lung cancer Father   . Healthy Paternal Grandmother   . Cancer Brother   . Colon cancer Neg Hx   . Esophageal cancer Neg Hx   . Stomach cancer Neg Hx   . Rectal cancer Neg Hx     Allergies  Allergen Reactions  . Jardiance [Empagliflozin] Rash    Current Outpatient Medications on File Prior to Visit  Medication Sig Dispense Refill  . atorvastatin (LIPITOR) 40 MG tablet Take 1 tablet (40 mg total) by mouth daily. 30 tablet 0  . Blood Glucose Monitoring Suppl (ONE TOUCH ULTRA MINI) W/DEVICE KIT 1 Device by Does not apply route once. 1 each 0  . dapagliflozin  propanediol (FARXIGA) 10 MG TABS tablet Take 1 tablet by mouth daily 30 tablet 2  . glucose blood (ONE TOUCH ULTRA TEST) test strip Use to check blood sugars twice a day. 100 each 2  . hydrocortisone 2.5 % cream Apply topically 2 (two) times daily. 30 g 0  . lansoprazole (PREVACID) 15 MG capsule Take 1 capsule (15 mg total) by mouth daily. Must keep scheduled appt w/new provider for refills 30 capsule 0  . lisinopril (PRINIVIL,ZESTRIL) 10 MG tablet Take 1 tablet (10 mg total) daily by mouth. Must schedule appt w/new provider for future refills 90 tablet 0   Current Facility-Administered Medications on File Prior to Visit  Medication Dose Route Frequency Provider Last Rate Last Dose  . 0.9 %  sodium chloride infusion  500 mL Intravenous Continuous Pyrtle, Lajuan Lines, MD        BP (!) 150/92   Pulse 81   Temp 98.4 F (36.9 C) (Oral)   Resp 16   Ht _0  (1.803 m)   Wt 224 lb (101.6 kg)   SpO2 97%   BMI 31.24 kg/m        Objective:   Physical Exam  Constitutional: He is oriented to person, place, and time. He appears well-developed and well-nourished. No distress.  HENT:  Head: Normocephalic and atraumatic.  Eyes: Pupils are equal, round, and reactive to light.  Neck: Normal range of motion. Neck supple.  Cardiovascular: Normal rate, regular rhythm, normal heart sounds and intact distal pulses.  Pulmonary/Chest: Effort normal and breath sounds normal.  Abdominal: Soft. He exhibits no distension.  Musculoskeletal: He exhibits no edema.  Neurological: He is alert and oriented to person, place, and time. Coordination normal.  Skin: Skin is warm and dry.  Psychiatric: He has a normal mood and affect. Judgment and thought content normal.  Vitals reviewed.     Assessment & Plan:  We discussed the role of diet, exercise and smoking cessation in improving his overall health and improving control of blood sugar, blood pressure and cholesterol. He states he intends to eventually quit  smoking and is going to work on increasing his daily exercise.

## 2017-04-09 NOTE — Assessment & Plan Note (Signed)
He was given chantix but afraid of side effects. He is thinking of quitting but not ready yet. hes not a heavy smoker- about 1.5 packs per week. Advised to quit.

## 2017-04-09 NOTE — Assessment & Plan Note (Addendum)
Last A1c was elevated but he reports normal readings at home despite stopping his glipizide on his own. Will recheck a1c today. Continue farxiga at current dosage for now. He is not up to date on his eye exam- he plans to call his eye doctor for his annual appointment and declines ophthalmology referral today Medications ordered: - lisinopril (PRINIVIL,ZESTRIL) 10 MG tablet; Take 1 tablet (10 mg total) by mouth daily. Must schedule appt w/new provider for future refills  Dispense: 90 tablet; Refill: 1 Diagnostic testing ordered: - Comprehensive metabolic panel; Future - Hemoglobin A1c; Future

## 2017-04-09 NOTE — Assessment & Plan Note (Signed)
Maintained on lisinopril with high readings. Will add amlodipine 5. Medications ordered: - lisinopril (PRINIVIL,ZESTRIL) 10 MG tablet; Take 1 tablet (10 mg total) by mouth daily. Must schedule appt w/new provider for future refills  Dispense: 90 tablet; Refill: 1 - amLODipine (NORVASC) 5 MG tablet; Take 1 tablet (5 mg total) by mouth daily.  Dispense: 30 tablet; Refill: 1 Diagnostic testing ordered: - Comprehensive metabolic panel; Future RTC in 2 weeks for amlodipine, BP follow up

## 2017-04-09 NOTE — Assessment & Plan Note (Signed)
Ran out of his atorvastatin about a month ago. He will resume today. We will recheck lipid panel. Medications ordered: - atorvastatin (LIPITOR) 40 MG tablet; Take 1 tablet (40 mg total) by mouth daily.  Dispense: 30 tablet; Refill: 2 Diagnostic testing ordered: - Lipid panel; Future

## 2017-04-09 NOTE — Patient Instructions (Addendum)
Please return to the lab downstairs for lab work in the next week when you are fasting for at least 8 hours prior (nothing but black coffee). We will check your cholesterol, blood sugar, kidneys and liver.  For your high blood pressure, please start taking amlodipine 5 mg once daily. Please continue your lisinopril 10 daily. Our goal is to get your blood pressure below 140/90 at all times.  For your blood sugar, continue your farxiga as you have been taking. Our goal is for your blood sugars to be below 150 consistently, this will get your A1c level below our 7% goal.  For your cholesterol, please resume your atorvastatin 40 once daily.  Please work on your diet and exercise as we discussed. Remember half of your plate should be veggies, one-fourth carbs, one-fourth meat, and don't eat meat at every meal. Also, remember to stay away from sugary drinks. I'd like for you to start incorporating exercise into your daily schedule. Start at 10 minutes a day, working up to 30 minutes five times a week. STOP SMOKING!  Id like to see you back in about 2 weeks, to see how you are doing on the amlodipine.  It was nice to meet you. Thanks for letting me take care of you today :)

## 2017-04-16 ENCOUNTER — Other Ambulatory Visit (INDEPENDENT_AMBULATORY_CARE_PROVIDER_SITE_OTHER): Payer: BLUE CROSS/BLUE SHIELD

## 2017-04-16 DIAGNOSIS — I10 Essential (primary) hypertension: Secondary | ICD-10-CM

## 2017-04-16 DIAGNOSIS — E785 Hyperlipidemia, unspecified: Secondary | ICD-10-CM

## 2017-04-16 DIAGNOSIS — E1165 Type 2 diabetes mellitus with hyperglycemia: Secondary | ICD-10-CM | POA: Diagnosis not present

## 2017-04-16 LAB — COMPREHENSIVE METABOLIC PANEL
ALBUMIN: 4.4 g/dL (ref 3.5–5.2)
ALT: 25 U/L (ref 0–53)
AST: 22 U/L (ref 0–37)
Alkaline Phosphatase: 61 U/L (ref 39–117)
BILIRUBIN TOTAL: 0.8 mg/dL (ref 0.2–1.2)
BUN: 23 mg/dL (ref 6–23)
CALCIUM: 9.5 mg/dL (ref 8.4–10.5)
CHLORIDE: 101 meq/L (ref 96–112)
CO2: 26 mEq/L (ref 19–32)
CREATININE: 1.18 mg/dL (ref 0.40–1.50)
GFR: 80.41 mL/min (ref >60.00)
Glucose, Bld: 118 mg/dL — ABNORMAL HIGH (ref 70–99)
Potassium: 4 mEq/L (ref 3.5–5.1)
SODIUM: 137 meq/L (ref 135–145)
TOTAL PROTEIN: 8 g/dL (ref 6.0–8.3)

## 2017-04-16 LAB — HEMOGLOBIN A1C: Hgb A1c MFr Bld: 7.7 % — ABNORMAL HIGH (ref 4.6–6.5)

## 2017-04-16 LAB — LIPID PANEL
CHOLESTEROL: 193 mg/dL (ref 0–200)
HDL: 36.5 mg/dL — ABNORMAL LOW (ref >39.00)
LDL CALC: 135 mg/dL — AB (ref 0–99)
NonHDL: 156.6
TRIGLYCERIDES: 106 mg/dL (ref 0.0–149.0)
Total CHOL/HDL Ratio: 5
VLDL: 21.2 mg/dL (ref 0.0–40.0)

## 2017-04-20 ENCOUNTER — Other Ambulatory Visit: Payer: Self-pay | Admitting: Nurse Practitioner

## 2017-04-20 DIAGNOSIS — E1165 Type 2 diabetes mellitus with hyperglycemia: Secondary | ICD-10-CM

## 2017-04-20 MED ORDER — GLIPIZIDE ER 5 MG PO TB24: 5.0000 mg | ORAL_TABLET | Freq: Every day | ORAL | 1 refills | Status: DC

## 2017-04-20 NOTE — Progress Notes (Signed)
orders

## 2017-04-22 ENCOUNTER — Ambulatory Visit: Payer: BLUE CROSS/BLUE SHIELD | Admitting: Nurse Practitioner

## 2017-05-07 ENCOUNTER — Other Ambulatory Visit: Payer: Self-pay | Admitting: Nurse Practitioner

## 2017-05-07 DIAGNOSIS — I10 Essential (primary) hypertension: Secondary | ICD-10-CM

## 2017-05-07 DIAGNOSIS — E1165 Type 2 diabetes mellitus with hyperglycemia: Secondary | ICD-10-CM

## 2017-05-08 MED ORDER — LISINOPRIL 10 MG PO TABS: ORAL_TABLET | ORAL | 0 refills | Status: DC

## 2017-05-08 NOTE — Addendum Note (Signed)
Addended by: Earnstine Regal on: 05/08/2017 09:26 AM   Modules accepted: Orders

## 2017-05-12 ENCOUNTER — Telehealth: Payer: Self-pay | Admitting: Nurse Practitioner

## 2017-05-12 MED ORDER — ACCU-CHEK AVIVA PLUS W/DEVICE KIT: PACK | 0 refills | Status: DC

## 2017-05-12 MED ORDER — ACCU-CHEK MULTICLIX LANCETS MISC: 5 refills | Status: DC

## 2017-05-12 MED ORDER — GLUCOSE BLOOD VI STRP: ORAL_STRIP | 3 refills | Status: DC

## 2017-05-12 NOTE — Telephone Encounter (Signed)
Reviewed chart pt is up-to-date sent refills to [facility].../lmb  

## 2017-05-12 NOTE — Telephone Encounter (Signed)
Copied from [place] [id number]. Topic: General - Other >> May 12, 2017 10:28 AM Darl Householder, RMA wrote: Reason for CRM: patient is requesting a new prescription per pharmacy for a ACCC-Check meter and test strips due to insurance no longer carries his current brand one touch ultra, please change prescription and [facility]

## 2017-05-13 ENCOUNTER — Other Ambulatory Visit: Payer: Self-pay

## 2017-05-13 DIAGNOSIS — I10 Essential (primary) hypertension: Secondary | ICD-10-CM

## 2017-05-13 MED ORDER — AMLODIPINE BESYLATE 5 MG PO TABS: 5.0000 mg | ORAL_TABLET | Freq: Every day | ORAL | 1 refills | Status: DC

## 2017-05-18 ENCOUNTER — Other Ambulatory Visit: Payer: Self-pay

## 2017-05-18 DIAGNOSIS — E1165 Type 2 diabetes mellitus with hyperglycemia: Secondary | ICD-10-CM

## 2017-05-18 MED ORDER — GLIPIZIDE ER 5 MG PO TB24: 5.0000 mg | ORAL_TABLET | Freq: Every day | ORAL | 0 refills | Status: DC

## 2017-05-28 ENCOUNTER — Ambulatory Visit: Payer: BLUE CROSS/BLUE SHIELD | Admitting: Nurse Practitioner

## 2017-06-02 ENCOUNTER — Other Ambulatory Visit: Payer: Self-pay

## 2017-06-02 DIAGNOSIS — I10 Essential (primary) hypertension: Secondary | ICD-10-CM

## 2017-06-02 MED ORDER — AMLODIPINE BESYLATE 5 MG PO TABS: 5.0000 mg | ORAL_TABLET | Freq: Every day | ORAL | 0 refills | Status: DC

## 2017-06-15 ENCOUNTER — Telehealth: Payer: Self-pay | Admitting: Nurse Practitioner

## 2017-06-15 NOTE — Telephone Encounter (Signed)
Copied from [place]. Topic: Quick Communication - Rx Refill/Question >> Jun 15, 2017 11:26 AM Synthia Innocent wrote: Medication: dapagliflozin propanediol (FARXIGA) 10 MG TABS tablet  Has the patient contacted their pharmacy? Yes.     (Agent: If no, request that the patient contact the pharmacy for the refill.)   Preferred Pharm[facility] (with phone number [facility] [address] Please be advised that RX refills may take up to 3 business days. We ask that you follow-up with your pharmacy.

## 2017-06-19 ENCOUNTER — Other Ambulatory Visit: Payer: Self-pay | Admitting: Family

## 2017-06-26 ENCOUNTER — Other Ambulatory Visit: Payer: Self-pay

## 2017-06-26 DIAGNOSIS — E785 Hyperlipidemia, unspecified: Secondary | ICD-10-CM

## 2017-06-26 MED ORDER — ATORVASTATIN CALCIUM 40 MG PO TABS: 40.0000 mg | ORAL_TABLET | Freq: Every day | ORAL | 0 refills | Status: DC

## 2017-07-07 ENCOUNTER — Other Ambulatory Visit: Payer: Self-pay | Admitting: Nurse Practitioner

## 2017-07-07 MED ORDER — DAPAGLIFLOZIN PROPANEDIOL 10 MG PO TABS: ORAL_TABLET | ORAL | 0 refills | Status: DC

## 2017-07-07 NOTE — Telephone Encounter (Signed)
Per office policy sent 30 day to local pharmacy until appt.../lmb  

## 2017-07-07 NOTE — Telephone Encounter (Signed)
Req. Refill on Farxiga; refilled 02/04/17; #30, RF x 2 Last OV 04/09/17 Hgb A1C 04/16/17 was 7.7; per result note, was to return in 4-6 weeks for f/u in office PCP: Caesar Chestnut [facility]

## 2017-07-07 NOTE — Telephone Encounter (Signed)
Copied from [place] [phone]. Topic: Quick Communication - Rx Refill/Question >> Jul 07, 2017  8:31 AM Lennox Solders wrote: Medication: farxiga Has the patient contacted their pharmacy? yes   Preferred Pharmacy (with phone number [facility] street name): [facility].

## 2017-07-16 ENCOUNTER — Other Ambulatory Visit: Payer: Self-pay | Admitting: Nurse Practitioner

## 2017-08-03 ENCOUNTER — Other Ambulatory Visit: Payer: Self-pay | Admitting: Nurse Practitioner

## 2017-08-07 ENCOUNTER — Ambulatory Visit: Payer: BLUE CROSS/BLUE SHIELD | Admitting: Nurse Practitioner

## 2017-08-12 ENCOUNTER — Telehealth: Payer: Self-pay | Admitting: Nurse Practitioner

## 2017-08-12 MED ORDER — LANSOPRAZOLE 15 MG PO CPDR
15.0000 mg | DELAYED_RELEASE_CAPSULE | Freq: Every day | ORAL | 1 refills | Status: DC
Start: 2017-08-12 — End: 2017-10-31

## 2017-08-12 NOTE — Telephone Encounter (Signed)
Reviewed chart pt is up-to-date sent refills to [facility].../lmb  

## 2017-08-12 NOTE — Telephone Encounter (Signed)
Copied from [place] [phone]. Topic: Quick Communication - Rx Refill/Question >> Aug 12, 2017  8:09 AM Pricilla Handler wrote: Medication: Lansoprazole (PREVACID) 15 MG capsule Has the patient contacted their pharmacy? Yes.   (Agent: If no, request that the patient contact the pharmacy for the refill.) Preferred Pharmacy (with phone number or street name): [id number] [place], [place] [phone] (Phone) [phone] (Fax).  Patient requests a 90 Day supply.  Agent: Please be advised that RX refills may take up to 3 business days. We ask that you follow-up with your pharmacy.

## 2017-08-12 NOTE — Addendum Note (Signed)
Addended by: Earnstine Regal on: 08/12/2017 10:21 AM   Modules accepted: Orders

## 2017-08-12 NOTE — Telephone Encounter (Signed)
Already refilled in another encounter

## 2017-08-12 NOTE — Telephone Encounter (Signed)
Copied from [place] [id number]. Topic: Quick Communication - Rx Refill/Question >> Aug 12, 2017  8:09 AM Pricilla Handler wrote: Medication: Lansoprazole (PREVACID) 15 MG capsule Has the patient contacted their pharmacy? Yes.   (Agent: If no, request that the patient contact the pharmacy for the refill.) Preferred Pharmacy (with phone number or street name): [id number] [place], [place] [phone] (Phone) [phone] (Fax).  Patient requests a 90 Day supply.  Agent: Please be advised that RX refills may take up to 3 business days. We ask that you follow-up with your pharmacy.

## 2017-08-27 ENCOUNTER — Ambulatory Visit: Payer: BLUE CROSS/BLUE SHIELD | Admitting: Nurse Practitioner

## 2017-09-02 ENCOUNTER — Encounter: Payer: Self-pay | Admitting: Nurse Practitioner

## 2017-09-02 ENCOUNTER — Ambulatory Visit: Payer: BLUE CROSS/BLUE SHIELD | Admitting: Nurse Practitioner

## 2017-09-02 VITALS — BP 124/84 | HR 79 | Temp 98.8°F | Resp 16 | Ht 71.0 in | Wt 220.8 lb

## 2017-09-02 DIAGNOSIS — E1165 Type 2 diabetes mellitus with hyperglycemia: Secondary | ICD-10-CM

## 2017-09-02 DIAGNOSIS — I10 Essential (primary) hypertension: Secondary | ICD-10-CM

## 2017-09-02 LAB — POCT GLYCOSYLATED HEMOGLOBIN (HGB A1C): Hemoglobin A1C: 6.3 % — AB (ref 4.0–5.6)

## 2017-09-02 MED ORDER — DAPAGLIFLOZIN PROPANEDIOL 10 MG PO TABS: 10.0000 mg | ORAL_TABLET | Freq: Every day | ORAL | 0 refills | Status: DC

## 2017-09-02 NOTE — Assessment & Plan Note (Signed)
Update A1c - POCT HgB A1C-6.3 today Continue current medications Continue to monitor BS readings at home F/U for readings <80, >200 RTC for CPE in september, will recheck A1c at that time

## 2017-09-02 NOTE — Progress Notes (Signed)
Name: Nathaniel Waters   MRN: [id number]    DOB: [birth year]   Date:09/02/2017       Progress Note  Subjective  Chief Complaint  Chief Complaint  Patient presents with  . Follow-up    blood pressure    HPI Nathaniel Waters is here today for a follow up of hypertension and diabetes. He was last seen by me on 04/16/17, he did not return for follow up until todays visit.  Hypertension -maintained on lisinopril 10 daily and amlodipine 5 was added at last OV on 04/16/17 for elevated blood pressure readings Reports daily medication compliance without noted adverse medication effects. Reports he occasionally checks his BP at the drug store and has been seeing mostly normal readings recently, although he does not recall the numbers. Denies headaches, vision changes, chest pain, shortness of breath, edema.  BP Readings from Last 3 Encounters:  09/02/17 124/84  04/09/17 (!) 150/92  12/31/16 128/82   Diabetes- maintained on  farxiga 10 daily, and After his last OV on 04/16/17, he was given instructions to resume his glipizide 85m once daily in the am due to elevated A1c reading. Reports daily medication compliance without noted adverse medication effects. Reports he checks his blood sugars most mornings, fasting readings mid 90s-110s recently. He denies low readings below 80. Denies tremor, diaphoresis, polyuria, polydipsia, polyphagia.  Lab Results  Component Value Date   HGBA1C 7.7 (H) 04/16/2017     Patient Active Problem List   Diagnosis Date Noted  . Hypertension 04/09/2017  . Cigarette nicotine dependence with nicotine-induced disorder 04/09/2017  . Low back pain 12/10/2016  . Erectile dysfunction associated with type 2 diabetes mellitus ([place] 10/23/2014  . Dyslipidemia 09/22/2014  . Elevated PSA 09/22/2014  . Routine general medical examination at a health care facility 09/21/2014  . Encounter for smoking cessation counseling 09/21/2014  . GERD 12/30/2007  . Type II diabetes mellitus,  uncontrolled ([place] 12/29/2007  . TUBULOVILLOUS ADENOMA, COLON, HX OF 12/29/2007    Past Surgical History:  Procedure Laterality Date  . CARPAL TUNNEL RELEASE     both   . COLONOSCOPY      Family History  Problem Relation Age of Onset  . Breast cancer Mother   . Heart disease Brother   . Lung cancer Father   . Healthy Paternal Grandmother   . Cancer Brother   . Colon cancer Neg Hx   . Esophageal cancer Neg Hx   . Stomach cancer Neg Hx   . Rectal cancer Neg Hx     Social History   Socioeconomic History  . Marital status: Legally Separated    Spouse name: Not on file  . Number of children: 4  . Years of education: 144 . Highest education level: Not on file  Occupational History  . Occupation: LChiropractor . Financial resource strain: Not on file  . Food insecurity:    Worry: Not on file    Inability: Not on file  . Transportation needs:    Medical: Not on file    Non-medical: Not on file  Tobacco Use  . Smoking status: Current Every Day Smoker    Packs/day: 0.25  . Smokeless tobacco: Never Used  Substance and Sexual Activity  . Alcohol use: Yes    Comment: occasionally on weekend  . Drug use: No  . Sexual activity: Not on file  Lifestyle  . Physical activity:    Days per week: Not on file    Minutes  per session: Not on file  . Stress: Not on file  Relationships  . Social connections:    Talks on phone: Not on file    Gets together: Not on file    Attends religious service: Not on file    Active member of club or organization: Not on file    Attends meetings of clubs or organizations: Not on file    Relationship status: Not on file  . Intimate partner violence:    Fear of current or ex partner: Not on file    Emotionally abused: Not on file    Physically abused: Not on file    Forced sexual activity: Not on file  Other Topics Concern  . Not on file  Social History Narrative   Fun: Listen to music and be around family and friends.    Denies religious beliefs effecting health care.      Current Outpatient Medications:  .  amLODipine (NORVASC) 5 MG tablet, Take 1 tablet (5 mg total) by mouth daily., Disp: 90 tablet, Rfl: 0 .  atorvastatin (LIPITOR) 40 MG tablet, Take 1 tablet (40 mg total) by mouth daily., Disp: 90 tablet, Rfl: 0 .  Blood Glucose Monitoring Suppl (ACCU-CHEK AVIVA PLUS) w/Device KIT, Use to check blood sugars twice a day, Disp: 1 kit, Rfl: 0 .  Blood Glucose Monitoring Suppl (ONE TOUCH ULTRA MINI) W/DEVICE KIT, 1 Device by Does not apply route once., Disp: 1 each, Rfl: 0 .  dapagliflozin propanediol (FARXIGA) 10 MG TABS tablet, Take 10 mg by mouth daily., Disp: 90 tablet, Rfl: 0 .  glipiZIDE (GLIPIZIDE XL) 5 MG 24 hr tablet, Take 1 tablet (5 mg total) by mouth daily with breakfast., Disp: 90 tablet, Rfl: 0 .  glucose blood (ACCU-CHEK AVIVA PLUS) test strip, Use to check blood sugars twice a day, Disp: 100 each, Rfl: 3 .  glucose blood (ONE TOUCH ULTRA TEST) test strip, Use to check blood sugars twice a day., Disp: 100 each, Rfl: 2 .  hydrocortisone 2.5 % cream, Apply topically 2 (two) times daily., Disp: 30 g, Rfl: 0 .  Lancets (ACCU-CHEK MULTICLIX) lancets, Use to help check blood sugars twice a day, Disp: 102 each, Rfl: 5 .  lansoprazole (PREVACID) 15 MG capsule, Take 1 capsule (15 mg total) by mouth daily., Disp: 90 capsule, Rfl: 1 .  lisinopril (PRINIVIL,ZESTRIL) 10 MG tablet, TAKE 1 TABLET (10 MG TOTAL) DAILY BY MOUTH. MUST SCHEDULE APPT W/NEW PROVIDER FOR FUTURE REFILLS, Disp: 90 tablet, Rfl: 0  Current Facility-Administered Medications:  .  0.9 %  sodium chloride infusion, 500 mL, Intravenous, Continuous, Pyrtle, Lajuan Lines, MD  Allergies  Allergen Reactions  . Metformin And Related Diarrhea  . Jardiance [Empagliflozin] Rash     ROS See HPI  Objective  Vitals:   09/02/17 0758  BP: 124/84  Pulse: 79  Resp: 16  Temp: 98.8 F (37.1 C)  TempSrc: Oral  SpO2: 96%  Weight: 220 lb 12.8 oz (100.2  kg)  Height: _0  (1.803 m)    Body mass index is 30.8 kg/m.  Physical Exam Vital signs reviewed. Constitutional: Patient appears well-developed and well-nourished. No distress.  HENT: Head: Normocephalic and atraumatic. Nose: Nose normal. Mouth/Throat: Oropharynx is clear and moist. No oropharyngeal exudate.  Eyes: Conjunctivae and EOM are normal. Pupils are equal, round, and reactive to light. No scleral icterus.  Neck: Normal range of motion. Neck supple.  Cardiovascular: Normal rate, regular rhythm and normal heart sounds.  No BLE edema. Distal pulses  intact. Pulmonary/Chest: Effort normal and breath sounds normal. No respiratory distress. Neurological: he is alert and oriented to person, place, and time. Coordination, balance, strength, speech and gait are normal.  Skin: Skin is warm and dry. No rash noted. No erythema.  Psychiatric: Patient has a normal mood and affect. behavior is normal. Judgment and thought content normal.   Assessment & Plan RTC for CPE Discussed the role of healthy diet and exercise in the management of HTN, DM, HLD and printed additional information in AVS

## 2017-09-02 NOTE — Patient Instructions (Addendum)
Please return after  12/18/17 For your annual physical with fasting lab work  Please try to check your blood pressure once daily or at least a few times a week, at the same time each day, and keep a log. Please follow up for readings >140/90.  Please record your blood sugars in a log twice daily, once in the morning when you first wake up up before eating anything and then again 2 hours after dinner or before bedtime. Please follow up for readings <80, >200.   Mediterranean Diet A Mediterranean diet refers to food and lifestyle choices that are based on the traditions of countries located on the [employer]. This way of eating has been shown to help prevent certain conditions and improve outcomes for people who have chronic diseases, like kidney disease and heart disease. What are tips for following this plan? Lifestyle  Cook and eat meals together with your family, when possible.  Drink enough fluid to keep your urine clear or pale yellow.  Be physically active every day. This includes: ? Aerobic exercise like running or swimming. ? Leisure activities like gardening, walking, or housework.  Get 7-8 hours of sleep each night.  If recommended by your health care provider, drink red wine in moderation. This means 1 glass a day for nonpregnant women and 2 glasses a day for men. A glass of wine equals 5 oz (150 mL). Reading food labels  Check the serving size of packaged foods. For foods such as rice and pasta, the serving size refers to the amount of cooked product, not dry.  Check the total fat in packaged foods. Avoid foods that have saturated fat or trans fats.  Check the ingredients list for added sugars, such as corn syrup. Shopping  At the grocery store, buy most of your food from the areas near the walls of the store. This includes: ? Fresh fruits and vegetables (produce). ? Grains, beans, nuts, and seeds. Some of these may be available in unpackaged forms or large  amounts (in bulk). ? Fresh seafood. ? Poultry and eggs. ? Low-fat dairy products.  Buy whole ingredients instead of prepackaged foods.  Buy fresh fruits and vegetables in-season from local farmers markets.  Buy frozen fruits and vegetables in resealable bags.  If you do not have access to quality fresh seafood, buy precooked frozen shrimp or canned fish, such as tuna, salmon, or sardines.  Buy small amounts of raw or cooked vegetables, salads, or olives from the deli or salad bar at your store.  Stock your pantry so you always have certain foods on hand, such as olive oil, canned tuna, canned tomatoes, rice, pasta, and beans. Cooking  Cook foods with extra-virgin olive oil instead of using butter or other vegetable oils.  Have meat as a side dish, and have vegetables or grains as your main dish. This means having meat in small portions or adding small amounts of meat to foods like pasta or stew.  Use beans or vegetables instead of meat in common dishes like chili or lasagna.  Experiment with different cooking methods. Try roasting or broiling vegetables instead of steaming or sauteing them.  Add frozen vegetables to soups, stews, pasta, or rice.  Add nuts or seeds for added healthy fat at each meal. You can add these to yogurt, salads, or vegetable dishes.  Marinate fish or vegetables using olive oil, lemon juice, garlic, and fresh herbs. Meal planning  Plan to eat 1 vegetarian meal one day each week. Try  to work up to 2 vegetarian meals, if possible.  Eat seafood 2 or more times a week.  Have healthy snacks readily available, such as: ? Vegetable sticks with hummus. ? [place] yogurt. ? Fruit and nut trail mix.  Eat balanced meals throughout the week. This includes: ? Fruit: 2-3 servings a day ? Vegetables: 4-5 servings a day ? Low-fat dairy: 2 servings a day ? Fish, poultry, or lean meat: 1 serving a day ? Beans and legumes: 2 or more servings a week ? Nuts and seeds:  1-2 servings a day ? Whole grains: 6-8 servings a day ? Extra-virgin olive oil: 3-4 servings a day  Limit red meat and sweets to only a few servings a month What are my food choices?  Mediterranean diet ? Recommended ? Grains: Whole-grain pasta. Brown rice. Bulgar wheat. Polenta. Couscous. Whole-wheat bread. [place]. ? Vegetables: Artichokes. Beets. Broccoli. Cabbage. Carrots. Eggplant. Green beans. Chard. Kale. Spinach. Onions. Leeks. Peas. Squash. Tomatoes. Peppers. Radishes. ? Fruits: Apples. Apricots. Avocado. Berries. Bananas. Cherries. Dates. Figs. Grapes. Lemons. Melon. Oranges. Peaches. Plums. Pomegranate. ? Meats and other protein foods: Beans. Almonds. Sunflower seeds. Pine nuts. Peanuts. Saxon. Salmon. Scallops. Shrimp. Grimes. Tilapia. Clams. Oysters. Eggs. ? Dairy: Low-fat milk. Cheese. Greek yogurt. ? Beverages: Water. Red wine. Herbal tea. ? Fats and oils: Extra virgin olive oil. Avocado oil. Grape seed oil. ? Sweets and desserts: [place] yogurt with honey. Baked apples. Poached pears. Trail mix. ? Seasoning and other foods: Basil. Cilantro. Coriander. Cumin. Mint. Parsley. Sage. Rosemary. Tarragon. Garlic. Oregano. Thyme. Pepper. Balsalmic vinegar. Tahini. Hummus. Tomato sauce. Olives. Mushrooms. ? Limit these ? Grains: Prepackaged pasta or rice dishes. Prepackaged cereal with added sugar. ? Vegetables: Deep fried potatoes (french fries). ? Fruits: Fruit canned in syrup. ? Meats and other protein foods: Beef. Pork. Lamb. Poultry with skin. Hot dogs. [place] Salines. ? Dairy: Ice cream. Sour cream. Whole milk. ? Beverages: Juice. Sugar-sweetened soft drinks. Beer. Liquor and spirits. ? Fats and oils: Butter. Canola oil. Vegetable oil. Beef fat (tallow). Lard. ? Sweets and desserts: Cookies. Cakes. Pies. Candy. ? Seasoning and other foods: Mayonnaise. Premade sauces and marinades. ? The items listed may not be a complete list. Talk with your dietitian about what dietary choices are  right for you. Summary  The Mediterranean diet includes both food and lifestyle choices.  Eat a variety of fresh fruits and vegetables, beans, nuts, seeds, and whole grains.  Limit the amount of red meat and sweets that you eat.  Talk with your health care provider about whether it is safe for you to drink red wine in moderation. This means 1 glass a day for nonpregnant women and 2 glasses a day for men. A glass of wine equals 5 oz (150 mL). This information is not intended to replace advice given to you by your health care provider. Make sure you discuss any questions you have with your health care provider. Document Released: 11/15/2015 Document Revised: 12/18/2015 Document Reviewed: 11/15/2015 Elsevier Interactive Patient Education  [employer].

## 2017-09-02 NOTE — Assessment & Plan Note (Signed)
BP readings appear stable on lisinopril and amlodipine-continue current dosages Continue to monitor BP readings at home F/U For readings >140/90 RTC for CPE in september, will recheck BP at that time

## 2017-09-04 ENCOUNTER — Other Ambulatory Visit: Payer: Self-pay | Admitting: Nurse Practitioner

## 2017-09-21 ENCOUNTER — Other Ambulatory Visit: Payer: Self-pay | Admitting: Nurse Practitioner

## 2017-09-21 DIAGNOSIS — E1165 Type 2 diabetes mellitus with hyperglycemia: Secondary | ICD-10-CM

## 2017-09-21 DIAGNOSIS — I10 Essential (primary) hypertension: Secondary | ICD-10-CM

## 2017-10-02 ENCOUNTER — Other Ambulatory Visit: Payer: Self-pay | Admitting: Nurse Practitioner

## 2017-10-02 DIAGNOSIS — E785 Hyperlipidemia, unspecified: Secondary | ICD-10-CM

## 2017-10-31 ENCOUNTER — Other Ambulatory Visit: Payer: Self-pay | Admitting: Nurse Practitioner

## 2017-11-30 ENCOUNTER — Other Ambulatory Visit: Payer: Self-pay | Admitting: Nurse Practitioner

## 2017-12-17 ENCOUNTER — Other Ambulatory Visit: Payer: Self-pay | Admitting: Nurse Practitioner

## 2017-12-17 DIAGNOSIS — E1165 Type 2 diabetes mellitus with hyperglycemia: Secondary | ICD-10-CM

## 2017-12-21 ENCOUNTER — Encounter: Payer: BLUE CROSS/BLUE SHIELD | Admitting: Nurse Practitioner

## 2017-12-27 ENCOUNTER — Other Ambulatory Visit: Payer: Self-pay | Admitting: Nurse Practitioner

## 2017-12-27 DIAGNOSIS — E785 Hyperlipidemia, unspecified: Secondary | ICD-10-CM

## 2017-12-27 DIAGNOSIS — I10 Essential (primary) hypertension: Secondary | ICD-10-CM

## 2018-01-17 ENCOUNTER — Other Ambulatory Visit: Payer: Self-pay | Admitting: Nurse Practitioner

## 2018-01-17 DIAGNOSIS — E1165 Type 2 diabetes mellitus with hyperglycemia: Secondary | ICD-10-CM

## 2018-01-17 DIAGNOSIS — I10 Essential (primary) hypertension: Secondary | ICD-10-CM

## 2018-01-28 ENCOUNTER — Other Ambulatory Visit: Payer: Self-pay | Admitting: Nurse Practitioner

## 2018-01-28 MED ORDER — OMEPRAZOLE 10 MG PO CPDR: 10.0000 mg | DELAYED_RELEASE_CAPSULE | Freq: Every day | ORAL | 0 refills | Status: DC

## 2018-01-28 NOTE — Addendum Note (Signed)
Addended by: Cresenciano Lick on: 01/28/2018 01:47 PM   Modules accepted: Orders

## 2018-02-18 ENCOUNTER — Ambulatory Visit (INDEPENDENT_AMBULATORY_CARE_PROVIDER_SITE_OTHER): Payer: BLUE CROSS/BLUE SHIELD | Admitting: Nurse Practitioner

## 2018-02-18 ENCOUNTER — Encounter: Payer: Self-pay | Admitting: Nurse Practitioner

## 2018-02-18 ENCOUNTER — Other Ambulatory Visit (INDEPENDENT_AMBULATORY_CARE_PROVIDER_SITE_OTHER): Payer: BLUE CROSS/BLUE SHIELD

## 2018-02-18 VITALS — BP 114/70 | HR 93 | Ht 71.0 in | Wt 216.0 lb

## 2018-02-18 DIAGNOSIS — Z114 Encounter for screening for human immunodeficiency virus [HIV]: Secondary | ICD-10-CM

## 2018-02-18 DIAGNOSIS — Z125 Encounter for screening for malignant neoplasm of prostate: Secondary | ICD-10-CM | POA: Diagnosis not present

## 2018-02-18 DIAGNOSIS — Z1159 Encounter for screening for other viral diseases: Secondary | ICD-10-CM

## 2018-02-18 DIAGNOSIS — E1165 Type 2 diabetes mellitus with hyperglycemia: Secondary | ICD-10-CM | POA: Diagnosis not present

## 2018-02-18 DIAGNOSIS — Z9189 Other specified personal risk factors, not elsewhere classified: Secondary | ICD-10-CM

## 2018-02-18 DIAGNOSIS — Z0001 Encounter for general adult medical examination with abnormal findings: Secondary | ICD-10-CM | POA: Diagnosis not present

## 2018-02-18 DIAGNOSIS — E785 Hyperlipidemia, unspecified: Secondary | ICD-10-CM

## 2018-02-18 DIAGNOSIS — Z23 Encounter for immunization: Secondary | ICD-10-CM | POA: Diagnosis not present

## 2018-02-18 DIAGNOSIS — I10 Essential (primary) hypertension: Secondary | ICD-10-CM

## 2018-02-18 DIAGNOSIS — R21 Rash and other nonspecific skin eruption: Secondary | ICD-10-CM | POA: Diagnosis not present

## 2018-02-18 DIAGNOSIS — Z122 Encounter for screening for malignant neoplasm of respiratory organs: Secondary | ICD-10-CM

## 2018-02-18 LAB — CBC
HEMATOCRIT: 47.3 % (ref 39.0–52.0)
Hemoglobin: 16.3 g/dL (ref 13.0–17.0)
MCHC: 34.5 g/dL (ref 30.0–36.0)
MCV: 95.2 fl (ref 78.0–100.0)
Platelets: 197 10*3/uL (ref 150.0–400.0)
RBC: 4.97 Mil/uL (ref 4.22–5.81)
RDW: 13 % (ref 11.5–15.5)
WBC: 7 10*3/uL (ref 4.0–10.5)

## 2018-02-18 LAB — COMPREHENSIVE METABOLIC PANEL
ALT: 33 U/L (ref 0–53)
AST: 29 U/L (ref 0–37)
Albumin: 4.3 g/dL (ref 3.5–5.2)
Alkaline Phosphatase: 71 U/L (ref 39–117)
BUN: 18 mg/dL (ref 6–23)
CHLORIDE: 105 meq/L (ref 96–112)
CO2: 25 meq/L (ref 19–32)
CREATININE: 0.97 mg/dL (ref 0.40–1.50)
Calcium: 9.7 mg/dL (ref 8.4–10.5)
GFR: 100.54 mL/min (ref >60.00)
Glucose, Bld: 81 mg/dL (ref 70–99)
POTASSIUM: 3.8 meq/L (ref 3.5–5.1)
SODIUM: 139 meq/L (ref 135–145)
Total Bilirubin: 0.5 mg/dL (ref 0.2–1.2)
Total Protein: 7.1 g/dL (ref 6.0–8.3)

## 2018-02-18 LAB — LIPID PANEL
Cholesterol: 138 mg/dL (ref 0–200)
HDL: 36.1 mg/dL — AB (ref >39.00)
NonHDL: 102.37
TRIGLYCERIDES: 223 mg/dL — AB (ref 0.0–149.0)
Total CHOL/HDL Ratio: 4
VLDL: 44.6 mg/dL — AB (ref 0.0–40.0)

## 2018-02-18 LAB — LDL CHOLESTEROL, DIRECT: LDL DIRECT: 79 mg/dL

## 2018-02-18 LAB — PSA: PSA: 6.97 ng/mL — AB (ref 0.10–4.00)

## 2018-02-18 LAB — HEMOGLOBIN A1C: Hgb A1c MFr Bld: 6.2 % (ref 4.6–6.5)

## 2018-02-18 MED ORDER — GLIPIZIDE ER 5 MG PO TB24: ORAL_TABLET | ORAL | 0 refills | Status: DC

## 2018-02-18 MED ORDER — TRIAMCINOLONE ACETONIDE 0.1 % EX CREA: 1.0000 "application " | TOPICAL_CREAM | Freq: Two times a day (BID) | CUTANEOUS | 0 refills | Status: DC

## 2018-02-18 NOTE — Assessment & Plan Note (Signed)
Stable Continue current medications - CBC; Future - Lipid panel; Future - Comprehensive metabolic panel; Future

## 2018-02-18 NOTE — Assessment & Plan Note (Signed)
Continue current medications  Update labs - glipiZIDE (GLUCOTROL XL) 5 MG 24 hr tablet; TAKE 1 TABLET BY MOUTH EVERY DAY WITH BREAKFAST  Dispense: 90 tablet; Refill: 0 - CBC; Future - Hemoglobin A1c; Future - Comprehensive metabolic panel; Future

## 2018-02-18 NOTE — Patient Instructions (Addendum)
Head downstairs for lab work  For the rash, kenalog twice daily as needed. Keep a log as we discussed Please follow up if rash persists   Rash A rash is a change in the color of the skin. A rash can also change the way your skin feels. There are many different conditions and factors that can cause a rash. Follow these instructions at home: Pay attention to any changes in your symptoms. Follow these instructions to help with your condition: Medicine Take or apply over-the-counter and prescription medicines only as told by your doctor. These may include:  Corticosteroid cream.  Anti-itch lotions.  Oral antihistamines.  Skin Care  Put cool compresses on the affected areas.  Try taking a bath with: ? Epsom salts. Follow the instructions on the packaging. You can get these at your local pharmacy or grocery store. ? Baking soda. Pour a small amount into the bath as told by your doctor. ? Colloidal oatmeal. Follow the instructions on the packaging. You can get this at your local pharmacy or grocery store.  Try putting baking soda paste onto your skin. Stir water into baking soda until it gets like a paste.  Do not scratch or rub your skin.  Avoid covering the rash. Make sure the rash is exposed to air as much as possible. General instructions  Avoid hot showers or baths, which can make itching worse. A cold shower may help.  Avoid scented soaps, detergents, and perfumes. Use gentle soaps, detergents, perfumes, and other cosmetic products.  Avoid anything that causes your rash. Keep a journal to help track what causes your rash. Write down: ? What you eat. ? What cosmetic products you use. ? What you drink. ? What you wear. This includes jewelry.  Keep all follow-up visits as told by your doctor. This is important. Contact a doctor if:  You sweat at night.  You lose weight.  You pee (urinate) more than normal.  You feel weak.  You throw up (vomit).  Your skin or the  whites of your eyes look yellow (jaundice).  Your skin: ? Tingles. ? Is numb.  Your rash: ? Does not go away after a few days. ? Gets worse.  You are: ? More thirsty than normal. ? More tired than normal.  You have: ? New symptoms. ? Pain in your belly (abdomen). ? A fever. ? Watery poop (diarrhea). Get help right away if:  Your rash covers all or most of your body. The rash may or may not be painful.  You have blisters that: ? Are on top of the rash. ? Grow larger. ? Grow together. ? Are painful. ? Are inside your nose or mouth.  You have a rash that: ? Looks like purple pinprick-sized spots all over your body. ? Has a "bull's eye" or looks like a target. ? Is red and painful, causes your skin to peel, and is not from being in the sun too long. This information is not intended to replace advice given to you by your health care provider. Make sure you discuss any questions you have with your health care provider. Document Released: 09/10/2007 Document Revised: 08/30/2015 Document Reviewed: 08/09/2014 Elsevier Interactive Patient Education  2018 [place] Maintenance, Male A healthy lifestyle and preventive care is important for your health and wellness. Ask your health care provider about what schedule of regular examinations is right for you. What should I know about weight and diet? Eat a Healthy Diet  Eat plenty of vegetables, fruits, whole grains, low-fat dairy products, and lean protein.  Do not eat a lot of foods high in solid fats, added sugars, or salt.  Maintain a Healthy Weight Regular exercise can help you achieve or maintain a healthy weight. You should:  Do at least 150 minutes of exercise each week. The exercise should increase your heart rate and make you sweat (moderate-intensity exercise).  Do strength-training exercises at least twice a week.  Watch Your Levels of Cholesterol and Blood Lipids  Have your blood tested for lipids  and cholesterol every 5 years starting at 63 years of age. If you are at high risk for heart disease, you should start having your blood tested when you are 63 years old. You may need to have your cholesterol levels checked more often if: ? Your lipid or cholesterol levels are high. ? You are older than 63 years of age. ? You are at high risk for heart disease.  What should I know about cancer screening? Many types of cancers can be detected early and may often be prevented. Lung Cancer  You should be screened every year for lung cancer if: ? You are a current smoker who has smoked for at least 30 years. ? You are a former smoker who has quit within the past 15 years.  Talk to your health care provider about your screening options, when you should start screening, and how often you should be screened.  Colorectal Cancer  Routine colorectal cancer screening usually begins at 63 years of age and should be repeated every 5-10 years until you are 63 years old. You may need to be screened more often if early forms of precancerous polyps or small growths are found. Your health care provider may recommend screening at an earlier age if you have risk factors for colon cancer.  Your health care provider may recommend using home test kits to check for hidden blood in the stool.  A small camera at the end of a tube can be used to examine your colon (sigmoidoscopy or colonoscopy). This checks for the earliest forms of colorectal cancer.  Prostate and Testicular Cancer  Depending on your age and overall health, your health care provider may do certain tests to screen for prostate and testicular cancer.  Talk to your health care provider about any symptoms or concerns you have about testicular or prostate cancer.  Skin Cancer  Check your skin from head to toe regularly.  Tell your health care provider about any new moles or changes in moles, especially if: ? There is a change in a mole's size,  shape, or color. ? You have a mole that is larger than a pencil eraser.  Always use sunscreen. Apply sunscreen liberally and repeat throughout the day.  Protect yourself by wearing long sleeves, pants, a wide-brimmed hat, and sunglasses when outside.  What should I know about heart disease, diabetes, and high blood pressure?  If you are 22-32 years of age, have your blood pressure checked every 3-5 years. If you are 47 years of age or older, have your blood pressure checked every year. You should have your blood pressure measured twice-once when you are at a hospital or clinic, and once when you are not at a hospital or clinic. Record the average of the two measurements. To check your blood pressure when you are not at a hospital or clinic, you can use: ? An automated blood pressure machine at a pharmacy. ? A  home blood pressure monitor.  Talk to your health care provider about your target blood pressure.  If you are between 42-35 years old, ask your health care provider if you should take aspirin to prevent heart disease.  Have regular diabetes screenings by checking your fasting blood sugar level. ? If you are at a normal weight and have a low risk for diabetes, have this test once every three years after the age of 4. ? If you are overweight and have a high risk for diabetes, consider being tested at a younger age or more often.  A one-time screening for abdominal aortic aneurysm (AAA) by ultrasound is recommended for men aged 56-75 years who are current or former smokers. What should I know about preventing infection? Hepatitis B If you have a higher risk for hepatitis B, you should be screened for this virus. Talk with your health care provider to find out if you are at risk for hepatitis B infection. Hepatitis C Blood testing is recommended for:  Everyone born from [birth year] through 1966.  Anyone with known risk factors for hepatitis C.  Sexually Transmitted Diseases (STDs)  You  should be screened each year for STDs including gonorrhea and chlamydia if: ? You are sexually active and are younger than 63 years of age. ? You are older than 63 years of age and your health care provider tells you that you are at risk for this type of infection. ? Your sexual activity has changed since you were last screened and you are at an increased risk for chlamydia or gonorrhea. Ask your health care provider if you are at risk.  Talk with your health care provider about whether you are at high risk of being infected with HIV. Your health care provider may recommend a prescription medicine to help prevent HIV infection.  What else can I do?  Schedule regular health, dental, and eye exams.  Stay current with your vaccines (immunizations).  Do not use any tobacco products, such as cigarettes, chewing tobacco, and e-cigarettes. If you need help quitting, ask your health care provider.  Limit alcohol intake to no more than 2 drinks per day. One drink equals 12 ounces of beer, 5 ounces of wine, or 1 ounces of hard liquor.  Do not use street drugs.  Do not share needles.  Ask your health care provider for help if you need support or information about quitting drugs.  Tell your health care provider if you often feel depressed.  Tell your health care provider if you have ever been abused or do not feel safe at home. This information is not intended to replace advice given to you by your health care provider. Make sure you discuss any questions you have with your health care provider. Document Released: 09/20/2007 Document Revised: 11/21/2015 Document Reviewed: 12/26/2014 Elsevier Interactive Patient Education  [employer].

## 2018-02-18 NOTE — Assessment & Plan Note (Signed)
Continue current medications He ate 5 hours ago Update labs - CBC; Future - Lipid panel; Future

## 2018-02-18 NOTE — Progress Notes (Signed)
Nathaniel Waters is a 62 y.o. male with the following history as recorded in [employer]:  Patient Active Problem List   Diagnosis Date Noted  . Hypertension 04/09/2017  . Cigarette nicotine dependence with nicotine-induced disorder 04/09/2017  . Erectile dysfunction associated with type 2 diabetes mellitus ([place]) 10/23/2014  . Dyslipidemia 09/22/2014  . Elevated PSA 09/22/2014  . Routine general medical examination at a health care facility 09/21/2014  . Encounter for smoking cessation counseling 09/21/2014  . GERD 12/30/2007  . Type II diabetes mellitus, uncontrolled ([place]) 12/29/2007  . TUBULOVILLOUS ADENOMA, COLON, HX OF 12/29/2007    Current Outpatient Medications  Medication Sig Dispense Refill  . amLODipine (NORVASC) 5 MG tablet TAKE 1 TABLET BY MOUTH EVERY DAY 90 tablet 0  . atorvastatin (LIPITOR) 40 MG tablet TAKE 1 TABLET BY MOUTH EVERY DAY 90 tablet 0  . Blood Glucose Monitoring Suppl (ACCU-CHEK AVIVA PLUS) w/Device KIT Use to check blood sugars twice a day 1 kit 0  . Blood Glucose Monitoring Suppl (ONE TOUCH ULTRA MINI) W/DEVICE KIT 1 Device by Does not apply route once. 1 each 0  . FARXIGA 10 MG TABS tablet TAKE 10 MG BY MOUTH DAILY. 90 tablet 0  . glipiZIDE (GLUCOTROL XL) 5 MG 24 hr tablet TAKE 1 TABLET BY MOUTH EVERY DAY WITH BREAKFAST 90 tablet 0  . glucose blood (ACCU-CHEK AVIVA PLUS) test strip Use to check blood sugars twice a day 100 each 3  . glucose blood (ONE TOUCH ULTRA TEST) test strip Use to check blood sugars twice a day. 100 each 2  . hydrocortisone 2.5 % cream Apply topically 2 (two) times daily. 30 g 0  . Lancets (ACCU-CHEK MULTICLIX) lancets Use to help check blood sugars twice a day 102 each 5  . lisinopril (PRINIVIL,ZESTRIL) 10 MG tablet TAKE 1 TABLET (10 MG TOTAL) DAILY BY MOUTH. MUST SCHEDULE APPT W/NEW PROVIDER FOR FUTURE REFILLS 90 tablet 0  . omeprazole (PRILOSEC) 10 MG capsule Take 1 capsule (10 mg total) by mouth daily. 90 capsule 0  . triamcinolone cream  (KENALOG) 0.1 % Apply 1 application topically 2 (two) times daily. 30 g 0   Current Facility-Administered Medications  Medication Dose Route Frequency Provider Last Rate Last Dose  . 0.9 %  sodium chloride infusion  500 mL Intravenous Continuous Pyrtle, Lajuan Lines, MD        Allergies: Metformin and related and Jardiance [empagliflozin]  Past Medical History:  Diagnosis Date  . Diabetes mellitus   . GERD (gastroesophageal reflux disease)     Past Surgical History:  Procedure Laterality Date  . CARPAL TUNNEL RELEASE     both   . COLONOSCOPY      Family History  Problem Relation Age of Onset  . Breast cancer Mother   . Heart disease Brother   . Lung cancer Father   . Healthy Paternal Grandmother   . Cancer Brother   . Colon cancer Neg Hx   . Esophageal cancer Neg Hx   . Stomach cancer Neg Hx   . Rectal cancer Neg Hx     Social History   Tobacco Use  . Smoking status: Current Every Day Smoker    Packs/day: 0.25  . Smokeless tobacco: Never Used  Substance Use Topics  . Alcohol use: Yes    Comment: occasionally on weekend     Subjective:  Here today for CPE Last dental exam: 2018 Last vision exam: 2018, plans to schedule with his own opthalmologist PSA: ordered today  Lung  cancer screening: referral today, current smoker, thinking of quitting, not ready yet Colonoscopy: up to date Lipids: lipid panel today DM: a1c today Vaccinations: flu vacc today, would like to wait a few weeks to get his PNA shot Diet and exercise: trying to watch diet and make healthier food choices, no regular exercise  Hypertension -maintained on amlodipine 5, lisinopril 10, takes daily without noted adverse effects  BP Readings from Last 3 Encounters:  02/18/18 114/70  09/02/17 124/84  04/09/17 (!) 150/92    Diabetes- maintained on  glipizde 5, farxiga 10,  takes daily without noted adverse effects  Lab Results  Component Value Date   HGBA1C 6.3 (A) 09/02/2017   HLD- maintained on  atorvastatin 40,  takes daily without noted adverse effects  Lab Results  Component Value Date   CHOL 193 04/16/2017   HDL 36.50 (L) 04/16/2017   LDLCALC 135 (H) 04/16/2017   LDLDIRECT 113.0 09/21/2014   TRIG 106.0 04/16/2017   CHOLHDL 5 04/16/2017    Review of Systems  Constitutional: Negative for chills and fever.  Eyes: Negative for blurred vision and double vision.  Respiratory: Negative for shortness of breath.   Cardiovascular: Negative for chest pain.  Gastrointestinal: Negative for abdominal pain.  Musculoskeletal: Negative for falls.  Skin: Positive for rash.  Neurological: Negative for loss of consciousness.  Endo/Heme/Allergies: Does not bruise/bleed easily.  Psychiatric/Behavioral: Negative for depression. The patient is not nervous/anxious.    Rash- describes as red itchy spots to bilateral forearms, noticed for some time now, intermittent, occurring at night, only a few nights per week, wakes him from sleep, applies HCT OTC cream with relief, denies oral swelling, facial swelling, hives  Objective:  Vitals:   02/18/18 0952  BP: 114/70  Pulse: 93  SpO2: 96%  Weight: 216 lb (98 kg)  Height: _0  (1.803 m)    General: Well developed, well nourished, in no acute distress  Skin : Warm and dry.  Head: Normocephalic and atraumatic  Eyes: Sclera and conjunctiva clear; pupils round and reactive to light; extraocular movements intact  Ears: External normal; canals clear; tympanic membranes normal  Oropharynx: Pink, supple. No suspicious lesions  Neck: Supple without thyromegaly, adenopathy  Lungs: Respirations unlabored; clear to auscultation bilaterally without wheeze, rales, rhonchi  CVS exam: normal rate and regular rhythm, S1 and S2 normal.  Abdomen: Soft; nontender; nondistended;  no masses Musculoskeletal: No deformities; no active joint inflammation  Extremities: No edema, cyanosis, clubbing  Vessels: Symmetric bilaterally  Neurologic: Alert and oriented;  speech intact; face symmetrical; moves all extremities well; CNII-XII intact without focal deficit  Psychiatric: Normal mood and affect.  Assessment:  1. Encounter for general adult medical examination with abnormal findings   2. Need for influenza vaccination   3. Uncontrolled type 2 diabetes mellitus with hyperglycemia ([place])   4. Hypertension, unspecified type   5. Dyslipidemia   6. Rash   7. Encounter for screening for lung cancer   8. Encounter for hepatitis C virus screening test for high risk patient   9. Screening for prostate cancer   10. Screening for HIV (human immunodeficiency virus)     Plan:   Return in about 6 months (around 08/19/2018) for routine F/u: DM- a1c.  Orders Placed This Encounter  Procedures  . Flu Vaccine QUAD 36+ mos IM  . CBC    Standing Status:   Future    Number of Occurrences:   1    Standing Expiration Date:   02/19/2019  .  Lipid panel    Standing Status:   Future    Number of Occurrences:   1    Standing Expiration Date:   02/19/2019  . PSA    Standing Status:   Future    Number of Occurrences:   1    Standing Expiration Date:   02/19/2019  . Hemoglobin A1c    Standing Status:   Future    Number of Occurrences:   1    Standing Expiration Date:   02/19/2019  . Comprehensive metabolic panel    Standing Status:   Future    Number of Occurrences:   1    Standing Expiration Date:   02/19/2019  . Hepatitis C antibody    Standing Status:   Future    Number of Occurrences:   1    Standing Expiration Date:   03/20/2018  . HIV Antibody (routine testing w rflx)    Standing Status:   Future    Number of Occurrences:   1    Standing Expiration Date:   03/20/2018  . Ambulatory Referral for Lung Cancer Scre    Referral Priority:   Routine    Referral Type:   Consultation    Referral Reason:   Specialty Services Required    Number of Visits Requested:   1    Requested Prescriptions   Signed Prescriptions Disp Refills  . glipiZIDE (GLUCOTROL  XL) 5 MG 24 hr tablet 90 tablet 0    Sig: TAKE 1 TABLET BY MOUTH EVERY DAY WITH BREAKFAST  . triamcinolone cream (KENALOG) 0.1 % 30 g 0    Sig: Apply 1 application topically 2 (two) times daily.   Rash Kenalog rx sent- dosing and side effects discussed Home management, red flags and return precautions including when to seek immediate care discussed and printed on AVS He will f/u if symptoms persist - triamcinolone cream (KENALOG) 0.1 %; Apply 1 application topically 2 (two) times daily.  Dispense: 30 g; Refill: 0

## 2018-02-18 NOTE — Assessment & Plan Note (Signed)
Reviewed annual screening exams, healthy lifestyle, additional information provided on AVS He will schedule ophthalmology exam on his own He will return for nurse visit for PNA vacc - CBC; Future - Lipid panel; Future - PSA; Future - Hemoglobin A1c; Future - Comprehensive metabolic panel; Future - Hepatitis C antibody; Future  Encounter for screening for lung cancer- Ambulatory Referral for Lung Cancer Scre  Encounter for hepatitis C virus screening test for high risk patient- Hepatitis C antibody; Future  Screening for prostate cancer- PSA; Future  Screening for HIV (human immunodeficiency virus)- HIV Antibody (routine testing w rflx); Future  Need for influenza vaccination- Flu Vaccine QUAD 36+ mos IM

## 2018-02-20 ENCOUNTER — Emergency Department (HOSPITAL_COMMUNITY): Payer: BLUE CROSS/BLUE SHIELD

## 2018-02-20 ENCOUNTER — Emergency Department (HOSPITAL_COMMUNITY)
Admission: EM | Admit: 2018-02-20 | Discharge: 2018-02-21 | Disposition: A | Discharge status: Home / Self Care | Payer: BLUE CROSS/BLUE SHIELD | Attending: Emergency Medicine | Admitting: Emergency Medicine

## 2018-02-20 ENCOUNTER — Encounter (HOSPITAL_COMMUNITY): Payer: Self-pay | Admitting: Emergency Medicine

## 2018-02-20 DIAGNOSIS — Y929 Unspecified place or not applicable: Secondary | ICD-10-CM | POA: Insufficient documentation

## 2018-02-20 DIAGNOSIS — S3991XA Unspecified injury of abdomen, initial encounter: Secondary | ICD-10-CM | POA: Diagnosis present

## 2018-02-20 DIAGNOSIS — Y999 Unspecified external cause status: Secondary | ICD-10-CM | POA: Diagnosis not present

## 2018-02-20 DIAGNOSIS — Y939 Activity, unspecified: Secondary | ICD-10-CM | POA: Insufficient documentation

## 2018-02-20 DIAGNOSIS — S0990XA Unspecified injury of head, initial encounter: Secondary | ICD-10-CM | POA: Diagnosis not present

## 2018-02-20 DIAGNOSIS — S71041A Puncture wound with foreign body, right hip, initial encounter: Secondary | ICD-10-CM | POA: Insufficient documentation

## 2018-02-20 DIAGNOSIS — W3400XA Accidental discharge from unspecified firearms or gun, initial encounter: Secondary | ICD-10-CM

## 2018-02-20 DIAGNOSIS — I10 Essential (primary) hypertension: Secondary | ICD-10-CM | POA: Diagnosis not present

## 2018-02-20 DIAGNOSIS — E119 Type 2 diabetes mellitus without complications: Secondary | ICD-10-CM | POA: Diagnosis not present

## 2018-02-20 DIAGNOSIS — F17200 Nicotine dependence, unspecified, uncomplicated: Secondary | ICD-10-CM | POA: Diagnosis not present

## 2018-02-20 HISTORY — DX: Type 2 diabetes mellitus without complications: E11.9

## 2018-02-20 HISTORY — DX: Essential (primary) hypertension: I10

## 2018-02-20 LAB — I-STAT CG4 LACTIC ACID, ED: Lactic Acid, Venous: 2.62 mmol/L (ref 0.5–1.9)

## 2018-02-20 LAB — I-STAT CHEM 8, ED
BUN: 17 mg/dL (ref 8–23)
CHLORIDE: 108 mmol/L (ref 98–111)
Calcium, Ion: 1.03 mmol/L — ABNORMAL LOW (ref 1.15–1.40)
Creatinine, Ser: 1.4 mg/dL — ABNORMAL HIGH (ref 0.61–1.24)
Glucose, Bld: 116 mg/dL — ABNORMAL HIGH (ref 70–99)
HEMATOCRIT: 48 % (ref 39.0–52.0)
Hemoglobin: 16.3 g/dL (ref 13.0–17.0)
POTASSIUM: 3.3 mmol/L — AB (ref 3.5–5.1)
Sodium: 141 mmol/L (ref 135–145)
TCO2: 21 mmol/L — ABNORMAL LOW (ref 22–32)

## 2018-02-20 MED ORDER — IOHEXOL 300 MG/ML  SOLN
100.0000 mL | Freq: Once | INTRAMUSCULAR | Status: AC | PRN
  Administered 2018-02-20: 100 mL via INTRAVENOUS

## 2018-02-20 MED ORDER — SODIUM CHLORIDE 0.9 % IV BOLUS
1000.0000 mL | Freq: Once | INTRAVENOUS | Status: AC
  Administered 2018-02-21: 1000 mL via INTRAVENOUS

## 2018-02-20 MED ORDER — MORPHINE SULFATE (PF) 4 MG/ML IV SOLN
INTRAVENOUS | Status: AC
  Administered 2018-02-20: 4 mg
  Filled 2018-02-20: qty 1
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
## 2018-02-21 ENCOUNTER — Emergency Department (HOSPITAL_COMMUNITY): Payer: BLUE CROSS/BLUE SHIELD

## 2018-02-21 ENCOUNTER — Emergency Department (HOSPITAL_COMMUNITY)
Admission: EM | Admit: 2018-02-21 | Discharge: 2018-02-21 | Disposition: A | Discharge status: Home / Self Care | Payer: BLUE CROSS/BLUE SHIELD | Source: Home / Self Care | Attending: Emergency Medicine | Admitting: Emergency Medicine

## 2018-02-21 ENCOUNTER — Other Ambulatory Visit: Payer: Self-pay

## 2018-02-21 ENCOUNTER — Encounter (HOSPITAL_COMMUNITY): Payer: Self-pay | Admitting: Obstetrics and Gynecology

## 2018-02-21 DIAGNOSIS — Z7984 Long term (current) use of oral hypoglycemic drugs: Secondary | ICD-10-CM | POA: Insufficient documentation

## 2018-02-21 DIAGNOSIS — Y939 Activity, unspecified: Secondary | ICD-10-CM

## 2018-02-21 DIAGNOSIS — F17200 Nicotine dependence, unspecified, uncomplicated: Secondary | ICD-10-CM | POA: Insufficient documentation

## 2018-02-21 DIAGNOSIS — Y929 Unspecified place or not applicable: Secondary | ICD-10-CM

## 2018-02-21 DIAGNOSIS — Y999 Unspecified external cause status: Secondary | ICD-10-CM

## 2018-02-21 DIAGNOSIS — S71131D Puncture wound without foreign body, right thigh, subsequent encounter: Secondary | ICD-10-CM | POA: Insufficient documentation

## 2018-02-21 DIAGNOSIS — I10 Essential (primary) hypertension: Secondary | ICD-10-CM

## 2018-02-21 DIAGNOSIS — E119 Type 2 diabetes mellitus without complications: Secondary | ICD-10-CM

## 2018-02-21 DIAGNOSIS — Z79899 Other long term (current) drug therapy: Secondary | ICD-10-CM | POA: Insufficient documentation

## 2018-02-21 DIAGNOSIS — M79604 Pain in right leg: Secondary | ICD-10-CM

## 2018-02-21 LAB — TYPE AND SCREEN
ABO/RH(D): O POS
ANTIBODY SCREEN: NEGATIVE
UNIT DIVISION: 0
Unit division: 0

## 2018-02-21 LAB — COMPREHENSIVE METABOLIC PANEL
ALBUMIN: 3.8 g/dL (ref 3.5–5.0)
ALT: 43 U/L (ref 0–44)
AST: 51 U/L — AB (ref 15–41)
Alkaline Phosphatase: 73 U/L (ref 38–126)
Anion gap: 13 (ref 5–15)
BILIRUBIN TOTAL: 1.1 mg/dL (ref 0.3–1.2)
BUN: 16 mg/dL (ref 8–23)
CO2: 19 mmol/L — ABNORMAL LOW (ref 22–32)
CREATININE: 1.28 mg/dL — AB (ref 0.61–1.24)
Calcium: 8.8 mg/dL — ABNORMAL LOW (ref 8.9–10.3)
Chloride: 103 mmol/L (ref 98–111)
GFR calc Af Amer: >60 mL/min (ref >60)
GFR, EST NON AFRICAN AMERICAN: 58 mL/min — AB (ref >60)
GLUCOSE: 117 mg/dL — AB (ref 70–99)
Potassium: 3.9 mmol/L (ref 3.5–5.1)
Sodium: 135 mmol/L (ref 135–145)
Total Protein: 7 g/dL (ref 6.5–8.1)

## 2018-02-21 LAB — BASIC METABOLIC PANEL
Anion gap: 9 (ref 5–15)
BUN: 21 mg/dL (ref 8–23)
CALCIUM: 9 mg/dL (ref 8.9–10.3)
CO2: 22 mmol/L (ref 22–32)
CREATININE: 1 mg/dL (ref 0.61–1.24)
Chloride: 107 mmol/L (ref 98–111)
GFR calc Af Amer: >60 mL/min (ref >60)
GFR calc non Af Amer: >60 mL/min (ref >60)
Glucose, Bld: 132 mg/dL — ABNORMAL HIGH (ref 70–99)
Potassium: 3.6 mmol/L (ref 3.5–5.1)
Sodium: 138 mmol/L (ref 135–145)

## 2018-02-21 LAB — PROTIME-INR
INR: 0.95
PROTHROMBIN TIME: 12.6 s (ref 11.4–15.2)

## 2018-02-21 LAB — CBC WITH DIFFERENTIAL/PLATELET
Abs Immature Granulocytes: 0.03 10*3/uL (ref 0.00–0.07)
Basophils Absolute: 0 10*3/uL (ref 0.0–0.1)
Basophils Relative: 0 %
EOS ABS: 0 10*3/uL (ref 0.0–0.5)
EOS PCT: 0 %
HEMATOCRIT: 43.5 % (ref 39.0–52.0)
Hemoglobin: 14.2 g/dL (ref 13.0–17.0)
Immature Granulocytes: 0 %
LYMPHS ABS: 1.4 10*3/uL (ref 0.7–4.0)
Lymphocytes Relative: 13 %
MCH: 31.6 pg (ref 26.0–34.0)
MCHC: 32.6 g/dL (ref 30.0–36.0)
MCV: 96.7 fL (ref 80.0–100.0)
MONOS PCT: 10 %
Monocytes Absolute: 1.1 10*3/uL — ABNORMAL HIGH (ref 0.1–1.0)
NRBC: 0 % (ref 0.0–0.2)
Neutro Abs: 8.4 10*3/uL — ABNORMAL HIGH (ref 1.7–7.7)
Neutrophils Relative %: 77 %
Platelets: 179 10*3/uL (ref 150–400)
RBC: 4.5 MIL/uL (ref 4.22–5.81)
RDW: 12.4 % (ref 11.5–15.5)
WBC: 11.1 10*3/uL — ABNORMAL HIGH (ref 4.0–10.5)

## 2018-02-21 LAB — CBG MONITORING, ED
Glucose-Capillary: 121 mg/dL — ABNORMAL HIGH (ref 70–99)
Glucose-Capillary: 169 mg/dL — ABNORMAL HIGH (ref 70–99)

## 2018-02-21 LAB — BPAM FFP
Blood Product Expiration Date: 201911202359
Blood Product Expiration Date: 201911202359
ISSUE DATE / TIME: 201911162327
ISSUE DATE / TIME: 201911162327
UNIT TYPE AND RH: 6200
Unit Type and Rh: 6200

## 2018-02-21 LAB — PREPARE FRESH FROZEN PLASMA
UNIT DIVISION: 0
UNIT DIVISION: 0

## 2018-02-21 LAB — ABO/RH: ABO/RH(D): O POS

## 2018-02-21 LAB — CBC
HEMATOCRIT: 51.3 % (ref 39.0–52.0)
HEMOGLOBIN: 15.9 g/dL (ref 13.0–17.0)
MCH: 31.1 pg (ref 26.0–34.0)
MCHC: 31 g/dL (ref 30.0–36.0)
MCV: 100.4 fL — AB (ref 80.0–100.0)
NRBC: 0 % (ref 0.0–0.2)
PLATELETS: 165 10*3/uL (ref 150–400)
RBC: 5.11 MIL/uL (ref 4.22–5.81)
RDW: 12.3 % (ref 11.5–15.5)
WBC: 9.2 10*3/uL (ref 4.0–10.5)

## 2018-02-21 LAB — BPAM RBC
BLOOD PRODUCT EXPIRATION DATE: 201911222359
Blood Product Expiration Date: 201912032359
ISSUE DATE / TIME: 201911162327
ISSUE DATE / TIME: 201911162327
UNIT TYPE AND RH: 9500
Unit Type and Rh: 9500

## 2018-02-21 LAB — I-STAT CG4 LACTIC ACID, ED: LACTIC ACID, VENOUS: 2.49 mmol/L — AB (ref 0.5–1.9)

## 2018-02-21 LAB — CDS SEROLOGY

## 2018-02-21 LAB — ETHANOL: ALCOHOL ETHYL (B): 189 mg/dL — AB (ref <10)

## 2018-02-21 MED ORDER — CYCLOBENZAPRINE HCL 10 MG PO TABS
10.0000 mg | ORAL_TABLET | Freq: Once | ORAL | Status: AC
  Administered 2018-02-21: 10 mg via ORAL
  Filled 2018-02-21: qty 1

## 2018-02-21 MED ORDER — HYDROCODONE-ACETAMINOPHEN 5-325 MG PO TABS: 1.0000 | ORAL_TABLET | ORAL | 0 refills | Status: DC | PRN

## 2018-02-21 MED ORDER — CYCLOBENZAPRINE HCL 10 MG PO TABS: 10.0000 mg | ORAL_TABLET | Freq: Two times a day (BID) | ORAL | 0 refills | Status: DC | PRN

## 2018-02-21 MED ORDER — CEPHALEXIN 500 MG PO CAPS: 500.0000 mg | ORAL_CAPSULE | Freq: Four times a day (QID) | ORAL | 0 refills | Status: DC

## 2018-02-21 MED ORDER — OXYCODONE-ACETAMINOPHEN 5-325 MG PO TABS
2.0000 | ORAL_TABLET | Freq: Once | ORAL | Status: AC
  Administered 2018-02-21: 2 via ORAL
  Filled 2018-02-21: qty 2

## 2018-02-21 MED ORDER — OXYCODONE-ACETAMINOPHEN 5-325 MG PO TABS: 2.0000 | ORAL_TABLET | Freq: Three times a day (TID) | ORAL | 0 refills | Status: DC | PRN

## 2018-02-21 MED ORDER — KETOROLAC TROMETHAMINE 30 MG/ML IJ SOLN
30.0000 mg | Freq: Once | INTRAMUSCULAR | Status: AC
  Administered 2018-02-21: 30 mg via INTRAVENOUS
  Filled 2018-02-21: qty 1

## 2018-02-21 NOTE — ED Notes (Signed)
Pt ambulating at bedside with minimal assistance.  Pt standing independently when holding on nearby structures for support.  Pt requesting walker.  Discussed with provider, prescription for walker written.
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
## 2018-02-21 NOTE — ED Notes (Signed)
Pt ambulatory with crutches.

## 2018-02-21 NOTE — Discharge Instructions (Signed)
You can take 1000 mg of Tylenol.  Do not exceed 4000 mg of Tylenol a day.  Take pain medications as directed for break through pain. Do not drive or operate machinery while taking this medication.   Take Flexeril as prescribed. This medication will make you drowsy so do not drive or drink alcohol when taking it.  Follow the RICE (Rest, Ice, Compression, Elevation) protocol as directed.   Use the crutches for support and stabilization.  Follow-up with primary care doctor as needed.  Return to emergency department for any worsening pain, redness or swelling of the wound, numbness/weakness of the leg or any other worsening or concerning symptoms.
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
## 2018-02-21 NOTE — ED Notes (Signed)
Pt stood up and wasn't able to take steps due to pain
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
## 2018-02-22 ENCOUNTER — Encounter: Payer: Self-pay | Admitting: Nurse Practitioner

## 2018-02-22 ENCOUNTER — Other Ambulatory Visit: Payer: Self-pay | Admitting: Nurse Practitioner

## 2018-02-22 ENCOUNTER — Ambulatory Visit: Payer: BLUE CROSS/BLUE SHIELD | Admitting: Nurse Practitioner

## 2018-02-22 ENCOUNTER — Encounter (HOSPITAL_COMMUNITY): Payer: Self-pay | Admitting: Obstetrics and Gynecology

## 2018-02-22 VITALS — BP 120/60 | HR 94 | Temp 98.4°F | Ht 71.0 in

## 2018-02-22 DIAGNOSIS — S71001A Unspecified open wound, right hip, initial encounter: Secondary | ICD-10-CM | POA: Diagnosis not present

## 2018-02-22 DIAGNOSIS — W3400XA Accidental discharge from unspecified firearms or gun, initial encounter: Secondary | ICD-10-CM

## 2018-02-22 DIAGNOSIS — R768 Other specified abnormal immunological findings in serum: Secondary | ICD-10-CM

## 2018-02-22 DIAGNOSIS — R972 Elevated prostate specific antigen [PSA]: Secondary | ICD-10-CM

## 2018-02-22 LAB — HIV ANTIBODY (ROUTINE TESTING W REFLEX): HIV 1&2 Ab, 4th Generation: NONREACTIVE

## 2018-02-22 LAB — HCV RNA,QUANTITATIVE REAL TIME PCR
HCV QUANT LOG: 6.7 {Log_IU}/mL — AB
HCV RNA, PCR, QN: 4970000 [IU]/mL — AB

## 2018-02-22 LAB — HEPATITIS C ANTIBODY
Hepatitis C Ab: REACTIVE — AB
SIGNAL TO CUT-OFF: 32 — ABNORMAL HIGH (ref <1.00)
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
## 2018-02-23 ENCOUNTER — Other Ambulatory Visit: Payer: Self-pay | Admitting: Nurse Practitioner

## 2018-03-08 ENCOUNTER — Telehealth: Payer: Self-pay | Admitting: *Deleted
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
## 2018-03-19 ENCOUNTER — Other Ambulatory Visit: Payer: Self-pay | Admitting: Nurse Practitioner

## 2018-03-19 DIAGNOSIS — I10 Essential (primary) hypertension: Secondary | ICD-10-CM

## 2018-03-25 ENCOUNTER — Other Ambulatory Visit: Payer: Self-pay | Admitting: Nurse Practitioner

## 2018-03-25 DIAGNOSIS — E785 Hyperlipidemia, unspecified: Secondary | ICD-10-CM

## 2018-04-16 ENCOUNTER — Other Ambulatory Visit: Payer: Self-pay | Admitting: Nurse Practitioner

## 2018-04-16 DIAGNOSIS — I10 Essential (primary) hypertension: Secondary | ICD-10-CM

## 2018-04-16 DIAGNOSIS — E1165 Type 2 diabetes mellitus with hyperglycemia: Secondary | ICD-10-CM

## 2018-05-19 ENCOUNTER — Other Ambulatory Visit: Payer: Self-pay | Admitting: *Deleted

## 2018-05-19 MED ORDER — GLIPIZIDE ER 5 MG PO TB24: 5.0000 mg | ORAL_TABLET | Freq: Every day | ORAL | 2 refills | Status: DC

## 2018-05-24 ENCOUNTER — Other Ambulatory Visit: Payer: Self-pay | Admitting: *Deleted

## 2018-05-24 MED ORDER — OMEPRAZOLE 10 MG PO CPDR: 10.0000 mg | DELAYED_RELEASE_CAPSULE | Freq: Every day | ORAL | 0 refills | Status: DC

## 2018-05-25 ENCOUNTER — Encounter: Payer: BLUE CROSS/BLUE SHIELD | Admitting: Family

## 2018-06-01 ENCOUNTER — Other Ambulatory Visit: Payer: Self-pay | Admitting: *Deleted

## 2018-06-01 MED ORDER — DAPAGLIFLOZIN PROPANEDIOL 10 MG PO TABS: ORAL_TABLET | ORAL | 0 refills | Status: DC

## 2018-06-15 ENCOUNTER — Other Ambulatory Visit: Payer: Self-pay | Admitting: Nurse Practitioner

## 2018-06-15 MED ORDER — GLUCOSE BLOOD VI STRP: ORAL_STRIP | 3 refills | Status: DC

## 2018-06-15 NOTE — Telephone Encounter (Signed)
Copied from [place] [phone]. Topic: Quick Communication - Rx Refill/Question >> Jun 15, 2018  9:04 AM Nathaniel Waters, NT wrote: Medication: glucose blood (ACCU-CHEK AVIVA PLUS) test strip  Has the patient contacted their pharmacy? Yes.  To contact office (Agent: If no, request that the patient contact the pharmacy for the refill.) (Agent: If yes, when and what did the pharmacy advise?)  Preferred Pharmacy (with phone number or street name): [id number] - [place], [place] [phone] (Phone) [phone] (Fax)    Agent: Please be advised that RX refills may take up to 3 business days. We ask that you follow-up with your pharmacy.

## 2018-06-21 ENCOUNTER — Other Ambulatory Visit: Payer: Self-pay | Admitting: *Deleted

## 2018-06-21 DIAGNOSIS — R21 Rash and other nonspecific skin eruption: Secondary | ICD-10-CM

## 2018-06-21 MED ORDER — TRIAMCINOLONE ACETONIDE 0.1 % EX CREA: 1.0000 "application " | TOPICAL_CREAM | Freq: Two times a day (BID) | CUTANEOUS | 0 refills | Status: DC

## 2018-06-28 ENCOUNTER — Other Ambulatory Visit: Payer: Self-pay | Admitting: *Deleted

## 2018-06-28 DIAGNOSIS — E785 Hyperlipidemia, unspecified: Secondary | ICD-10-CM

## 2018-06-28 MED ORDER — ATORVASTATIN CALCIUM 40 MG PO TABS: 40.0000 mg | ORAL_TABLET | Freq: Every day | ORAL | 0 refills | Status: DC

## 2018-07-12 ENCOUNTER — Other Ambulatory Visit: Payer: Self-pay | Admitting: *Deleted

## 2018-07-12 DIAGNOSIS — E1165 Type 2 diabetes mellitus with hyperglycemia: Secondary | ICD-10-CM

## 2018-07-12 DIAGNOSIS — I10 Essential (primary) hypertension: Secondary | ICD-10-CM

## 2018-07-12 MED ORDER — LISINOPRIL 10 MG PO TABS: 10.0000 mg | ORAL_TABLET | Freq: Every day | ORAL | 0 refills | Status: DC

## 2018-07-15 ENCOUNTER — Other Ambulatory Visit: Payer: Self-pay | Admitting: *Deleted

## 2018-07-15 DIAGNOSIS — I10 Essential (primary) hypertension: Secondary | ICD-10-CM

## 2018-07-15 MED ORDER — AMLODIPINE BESYLATE 5 MG PO TABS: 5.0000 mg | ORAL_TABLET | Freq: Every day | ORAL | 0 refills | Status: DC

## 2018-08-25 ENCOUNTER — Other Ambulatory Visit: Payer: Self-pay | Admitting: *Deleted

## 2018-08-25 MED ORDER — OMEPRAZOLE 10 MG PO CPDR: 10.0000 mg | DELAYED_RELEASE_CAPSULE | Freq: Every day | ORAL | 0 refills | Status: DC

## 2018-09-03 ENCOUNTER — Other Ambulatory Visit: Payer: Self-pay | Admitting: *Deleted

## 2018-09-03 MED ORDER — DAPAGLIFLOZIN PROPANEDIOL 10 MG PO TABS: ORAL_TABLET | ORAL | 0 refills | Status: DC

## 2018-09-22 ENCOUNTER — Other Ambulatory Visit: Payer: Self-pay | Admitting: *Deleted

## 2018-09-22 DIAGNOSIS — E785 Hyperlipidemia, unspecified: Secondary | ICD-10-CM

## 2018-09-22 MED ORDER — ATORVASTATIN CALCIUM 40 MG PO TABS: 40.0000 mg | ORAL_TABLET | Freq: Every day | ORAL | 0 refills | Status: DC

## 2018-10-05 ENCOUNTER — Encounter (HOSPITAL_COMMUNITY): Payer: Self-pay

## 2018-10-05 ENCOUNTER — Other Ambulatory Visit: Payer: Self-pay

## 2018-10-05 ENCOUNTER — Ambulatory Visit (HOSPITAL_COMMUNITY)
Admission: EM | Admit: 2018-10-05 | Discharge: 2018-10-05 | Disposition: A | Discharge status: Home / Self Care | Payer: BC Managed Care – PPO | Attending: Family Medicine | Admitting: Family Medicine

## 2018-10-05 DIAGNOSIS — M79605 Pain in left leg: Secondary | ICD-10-CM | POA: Diagnosis not present

## 2018-10-05 DIAGNOSIS — G5712 Meralgia paresthetica, left lower limb: Secondary | ICD-10-CM

## 2018-10-05 MED ORDER — PREDNISONE 10 MG (21) PO TBPK: ORAL_TABLET | Freq: Every day | ORAL | 0 refills | Status: DC

## 2018-10-05 NOTE — ED Provider Notes (Signed)
[facility]   [id number] 10/05/18 Arrival Time: 8588  ASSESSMENT & PLAN:  1. Left leg pain   2. Meralgia paraesthetica, left    Able to ambulate here and hemodynamically stable. No indication for imaging of back at this time given no trauma and normal neurological exam. No bowel/bladder habit changes. Discussed. See AVS for d/c written information.  Trial of: Meds ordered this encounter  Medications  . predniSONE (STERAPRED UNI-PAK 21 TAB) 10 MG (21) TBPK tablet    Sig: Take by mouth daily. Take as directed.    Dispense:  21 tablet    Refill:  0   OTC ibuprofen with food. Encourage ROM/movement as tolerated.  Follow-up Information    Lance Sell, NP.   Specialty: Nurse Practitioner Why: As needed. Contact information: [place] [address] [phone]           Reviewed expectations re: course of current medical issues. Questions answered. Outlined signs and symptoms indicating need for more acute intervention. Patient verbalized understanding. After Visit Summary given.   SUBJECTIVE: History from: patient.  Celester Lech is a 63 y.o. male who presents with complaint of intermittent pain of his outer left thigh; from buttock to knee. Onset gradual, first noticed approx 3 weeks ago. Injury/trama: no. History of back problems: none reported. Discomfort described as aching and burning without radiation. Pain is not affected by movement or rest. No extremity sensation changes or weakness. No specific aggravating or alleviating factors reported. Progressive LE weakness or saddle anesthesia: none. Extremity sensation changes or weakness: none. Ambulatory without difficulty. Normal bowel/bladder habits: yes; without urinary retention. No associated abdominal pain/n/v. Self treatment: has tried occasional OTC Tylenol; "not much help". No abdominal pain. Normal PO intake.  Reports no chronic steroid use, fevers, IV drug use, or recent back  surgeries or procedures.  ROS: As per HPI. All other systems negative.   OBJECTIVE:  Vitals:   10/05/18 0955  BP: 122/70  Pulse: 93  Resp: 18  Temp: 98.2 F (36.8 C)  TempSrc: Oral  SpO2: 95%    General appearance: alert; no distress Neck: supple with FROM; without midline tenderness CV: RRR Lungs: unlabored respirations; symmetrical air entry Abdomen: soft, non-tender; non-distended Back: no lower back tenderness; FROM at waist; bruising: none; without midline tenderness Extremities: no edema; symmetrical with no gross deformities; normal ROM of bilateral lower extremities; reports mild discomfort to palpation of lateral left thigh Skin: warm and dry Neurologic: normal gait; normal reflexes of RLE and LLE; normal sensation of RLE and LLE; normal strength of RLE and LLE Psychological: alert and cooperative; normal mood and affect  Allergies  Allergen Reactions  . Metformin And Related Diarrhea  . Jardiance [Empagliflozin] Rash    Past Medical History:  Diagnosis Date  . Diabetes mellitus   . Diabetes mellitus without complication ([place])   . GERD (gastroesophageal reflux disease)   . Hypertension    Social History   Socioeconomic History  . Marital status: Legally Separated    Spouse name: Not on file  . Number of children: 4  . Years of education: 48  . Highest education level: Not on file  Occupational History  . Occupation: Chiropractor  . Financial resource strain: Not on file  . Food insecurity    Worry: Not on file    Inability: Not on file  . Transportation needs    Medical: Not on file    Non-medical: Not on file  Tobacco  Use  . Smoking status: Current Every Day Smoker    Packs/day: 0.25  . Smokeless tobacco: Never Used  Substance and Sexual Activity  . Alcohol use: Yes    Comment: occasionally on weekend  . Drug use: No  . Sexual activity: Not on file  Lifestyle  . Physical activity    Days per week: Not on file    Minutes  per session: Not on file  . Stress: Not on file  Relationships  . Social Herbalist on phone: Not on file    Gets together: Not on file    Attends religious service: Not on file    Active member of club or organization: Not on file    Attends meetings of clubs or organizations: Not on file    Relationship status: Not on file  . Intimate partner violence    Fear of current or ex partner: Not on file    Emotionally abused: Not on file    Physically abused: Not on file    Forced sexual activity: Not on file  Other Topics Concern  . Not on file  Social History Narrative   ** Merged History Encounter **       Fun: Listen to music and be around family and friends. Denies religious beliefs effecting health care.    Family History  Problem Relation Age of Onset  . Breast cancer Mother   . Heart disease Brother   . Lung cancer Father   . Healthy Paternal Grandmother   . Cancer Brother   . Colon cancer Neg Hx   . Esophageal cancer Neg Hx   . Stomach cancer Neg Hx   . Rectal cancer Neg Hx    Past Surgical History:  Procedure Laterality Date  . CARPAL TUNNEL RELEASE     both   . COLONOSCOPY       Vanessa Kick, MD 10/05/18 1153

## 2018-10-05 NOTE — Discharge Instructions (Signed)
Check your blood sugars at home while taking Prednisone. Expect your blood sugars to rise while taking.

## 2018-10-05 NOTE — ED Triage Notes (Signed)
Patient presents to Urgent Care with complaints of left hip pain that radiates down to his left knee since about 3 weeks ago. Patient reports he feels like he pulled a muscle but does not remember doing so. Pt has been using OTC pain meds and gels without relief.

## 2018-10-07 ENCOUNTER — Other Ambulatory Visit: Payer: Self-pay | Admitting: Internal Medicine

## 2018-10-07 DIAGNOSIS — E1165 Type 2 diabetes mellitus with hyperglycemia: Secondary | ICD-10-CM

## 2018-10-07 DIAGNOSIS — I10 Essential (primary) hypertension: Secondary | ICD-10-CM

## 2018-10-09 ENCOUNTER — Other Ambulatory Visit: Payer: Self-pay

## 2018-10-09 ENCOUNTER — Ambulatory Visit (HOSPITAL_COMMUNITY)
Admission: EM | Admit: 2018-10-09 | Discharge: 2018-10-09 | Disposition: A | Discharge status: Home / Self Care | Payer: BC Managed Care – PPO | Attending: Family Medicine | Admitting: Family Medicine

## 2018-10-09 DIAGNOSIS — M25552 Pain in left hip: Secondary | ICD-10-CM

## 2018-10-09 MED ORDER — TRAMADOL HCL 50 MG PO TABS: 50.0000 mg | ORAL_TABLET | Freq: Four times a day (QID) | ORAL | 0 refills | Status: DC | PRN

## 2018-10-09 MED ORDER — KETOROLAC TROMETHAMINE 30 MG/ML IJ SOLN
30.0000 mg | Freq: Once | INTRAMUSCULAR | Status: AC
  Administered 2018-10-09: 30 mg via INTRAMUSCULAR

## 2018-10-09 MED ORDER — KETOROLAC TROMETHAMINE 30 MG/ML IJ SOLN
INTRAMUSCULAR | Status: AC
  Filled 2018-10-09: qty 1

## 2018-10-09 NOTE — Discharge Instructions (Addendum)
Be aware, pain medications may cause drowsiness. Please do not drive, operate heavy machinery or make important decisions while on this medication, it can cloud your judgement.  Meds ordered this encounter  Medications   ketorolac (TORADOL) 30 MG/ML injection 30 mg   traMADol (ULTRAM) 50 MG tablet    Sig: Take 1 tablet (50 mg total) by mouth every 6 (six) hours as needed.    Dispense:  15 tablet    Refill:  0

## 2018-10-09 NOTE — ED Triage Notes (Signed)
Per pt he was here on Tuesday with dx of pinched nerve and was placed on prednisone. Pt says no relief

## 2018-10-09 NOTE — ED Provider Notes (Signed)
[facility]   [id number] 10/09/18 Arrival Time: 4818  ASSESSMENT & PLAN:  1. Hip pain, acute, left   Discussed possible trochanteric bursitis.  Recommend: Follow-up Information    Schedule an appointment as soon as possible for a visit  with [place].   Contact information: [place] [phone]         Meds ordered this encounter  Medications  . ketorolac (TORADOL) 30 MG/ML injection 30 mg  . traMADol (ULTRAM) 50 MG tablet    Sig: Take 1 tablet (50 mg total) by mouth every 6 (six) hours as needed.    Dispense:  15 tablet    Refill:  0   [place] Controlled Substances Registry consulted for this patient. I feel the risk/benefit ratio today is favorable for proceeding with this prescription for a controlled substance. Medication sedation precautions given.  Reviewed expectations re: course of current medical issues. Questions answered. Outlined signs and symptoms indicating need for more acute intervention. Patient verbalized understanding. After Visit Summary given.  SUBJECTIVE: History from: patient. Tiquan Bouch is a 63 y.o. male who returns with continued L upper leg/hip pain. Taking prednisone. First couple of days did notice a difference. 'Pain is now back like it was before'. Ambulatory without difficulty. Pain worse with certain movements but also painful at rest; interfering with sleep. No extremity sensation changes or weakness though previously he reported some paresthesia-like symptoms. Normal bowel/bladder habits. No OTC tx.  Past Surgical History:  Procedure Laterality Date  . CARPAL TUNNEL RELEASE     both   . COLONOSCOPY       ROS: As per HPI. All other systems negative.    OBJECTIVE:  Vitals:   10/09/18 1038  BP: 121/71  Pulse: 97  Resp: 16  Temp: 98.3 F (36.8 C)  TempSrc: Oral  SpO2: 98%    General appearance: alert; no distress HEENT: [place]; AT Neck:  supple with FROM Resp: unlabored respirations Extremities: . LUE: warm and well perfused; he is much more tender over upper lateral lower left leg, more specifically over trochanteric bursa; FROM Skin: warm and dry; no visible rashes Neurologic: gait normal; normal reflexes of RLE and LLE; normal sensation of RLE and LLE; normal strength of RLE and LLE Psychological: alert and cooperative; normal mood and affect  Allergies  Allergen Reactions  . Metformin And Related Diarrhea  . Jardiance [Empagliflozin] Rash    Past Medical History:  Diagnosis Date  . Diabetes mellitus   . Diabetes mellitus without complication ([place])   . GERD (gastroesophageal reflux disease)   . Hypertension    Social History   Socioeconomic History  . Marital status: Legally Separated    Spouse name: Not on file  . Number of children: 4  . Years of education: 11  . Highest education level: Not on file  Occupational History  . Occupation: Chiropractor  . Financial resource strain: Not on file  . Food insecurity    Worry: Not on file    Inability: Not on file  . Transportation needs    Medical: Not on file    Non-medical: Not on file  Tobacco Use  . Smoking status: Current Every Day Smoker    Packs/day: 0.25  . Smokeless tobacco: Never Used  Substance and Sexual Activity  . Alcohol use: Yes    Comment: occasionally on weekend  . Drug use: No  . Sexual activity: Not on  file  Lifestyle  . Physical activity    Days per week: Not on file    Minutes per session: Not on file  . Stress: Not on file  Relationships  . Social Herbalist on phone: Not on file    Gets together: Not on file    Attends religious service: Not on file    Active member of club or organization: Not on file    Attends meetings of clubs or organizations: Not on file    Relationship status: Not on file  Other Topics Concern  . Not on file  Social History Narrative   ** Merged History Encounter **        Fun: Listen to music and be around family and friends. Denies religious beliefs effecting health care.    Family History  Problem Relation Age of Onset  . Breast cancer Mother   . Heart disease Brother   . Lung cancer Father   . Healthy Paternal Grandmother   . Cancer Brother   . Colon cancer Neg Hx   . Esophageal cancer Neg Hx   . Stomach cancer Neg Hx   . Rectal cancer Neg Hx    Past Surgical History:  Procedure Laterality Date  . CARPAL TUNNEL RELEASE     both   . COLONOSCOPY        Vanessa Kick, MD 10/09/18 1102

## 2018-10-13 ENCOUNTER — Other Ambulatory Visit: Payer: Self-pay

## 2018-10-13 ENCOUNTER — Ambulatory Visit: Payer: BC Managed Care – PPO | Admitting: Family Medicine

## 2018-10-13 ENCOUNTER — Ambulatory Visit
Admission: RE | Admit: 2018-10-13 | Discharge: 2018-10-13 | Disposition: A | Discharge status: Home / Self Care | Payer: BC Managed Care – PPO | Source: Ambulatory Visit | Attending: Family Medicine | Admitting: Family Medicine

## 2018-10-13 ENCOUNTER — Encounter: Payer: Self-pay | Admitting: Family Medicine

## 2018-10-13 VITALS — BP 142/94 | Ht 71.0 in | Wt 210.0 lb

## 2018-10-13 DIAGNOSIS — M25552 Pain in left hip: Secondary | ICD-10-CM

## 2018-10-13 MED ORDER — HYDROCODONE-ACETAMINOPHEN 5-325 MG PO TABS: 1.0000 | ORAL_TABLET | Freq: Four times a day (QID) | ORAL | 0 refills | Status: DC | PRN

## 2018-10-13 MED ORDER — NAPROXEN 500 MG PO TABS: 500.0000 mg | ORAL_TABLET | Freq: Two times a day (BID) | ORAL | 2 refills | Status: DC | PRN

## 2018-10-13 NOTE — Patient Instructions (Signed)
Get x-rays of your hip - we will contact you with the results. I'm concerned you may be developing avascular necrosis of your hip so if the x-rays are normal we will have to do an MRI. In meantime take naproxen 500mg  twice a day with food. Tylenol 500mg  three times a day as needed. Icing 15 minutes at a time as needed. Hydrocodone as needed for severe pain (do NOT take with tylenol because it has tylenol in it;  No driving on this medicine). Out of work for 1 week. Follow up will depend on the test results.

## 2018-10-13 NOTE — Progress Notes (Signed)
PCP: Lance Sell, NP  Subjective:   HPI: Patient is a 63 y.o. male here for left hip pain.  Patient reports about 3 weeks ago he started to get left hip pain. No injury or trauma. Pain started as an ache but is now throbbing and severe, difficult to get comfortable. Prednisone from Urgent Care helped but only for 2 days. No prior issues with this hip. Pain radiates down to about the knee, lateral into groin and posterior hip. Denies back pain. Some numbness intermittently. Tried tramadol, IM toradol also. No bowel/bladder dysfunction.  Past Medical History:  Diagnosis Date  . Diabetes mellitus   . Diabetes mellitus without complication ([place])   . GERD (gastroesophageal reflux disease)   . Hypertension     Current Outpatient Medications on File Prior to Visit  Medication Sig Dispense Refill  . amLODipine (NORVASC) 5 MG tablet Take 5 mg by mouth daily.    Marland Kitchen amLODipine (NORVASC) 5 MG tablet Take 1 tablet (5 mg total) by mouth daily. 90 tablet 0  . atorvastatin (LIPITOR) 40 MG tablet Take 1 tablet (40 mg total) by mouth daily. NEED TO ESTABLISH WITH NEW PROVIDER 90 tablet 0  . Blood Glucose Monitoring Suppl (ACCU-CHEK AVIVA PLUS) w/Device KIT Use to check blood sugars twice a day 1 kit 0  . Blood Glucose Monitoring Suppl (ONE TOUCH ULTRA MINI) W/DEVICE KIT 1 Device by Does not apply route once. 1 each 0  . cephALEXin (KEFLEX) 500 MG capsule Take 1 capsule (500 mg total) by mouth 4 (four) times daily. 40 capsule 0  . cephALEXin (KEFLEX) 500 MG capsule Take 500 mg by mouth 4 (four) times daily.    . cyclobenzaprine (FLEXERIL) 10 MG tablet Take 1 tablet (10 mg total) by mouth 2 (two) times daily as needed for muscle spasms. 20 tablet 0  . dapagliflozin propanediol (FARXIGA) 10 MG TABS tablet TAKE 10 MG BY MOUTH DAILY. NEED TO ESTABLISH WITH NEW PROVIDER. 90 tablet 0  . glipiZIDE (GLUCOTROL XL) 5 MG 24 hr tablet Take 1 tablet (5 mg total) by mouth daily with breakfast. 90 tablet  2  . glucose blood (ACCU-CHEK AVIVA PLUS) test strip Use to check blood sugars twice a day 100 each 3  . glucose blood (ONE TOUCH ULTRA TEST) test strip Use to check blood sugars twice a day. 100 each 2  . hydrocortisone 2.5 % cream Apply topically 2 (two) times daily. 30 g 0  . Lancets (ACCU-CHEK MULTICLIX) lancets Use to help check blood sugars twice a day 102 each 5  . lisinopril (ZESTRIL) 10 MG tablet TAKE 1 TABLET BY MOUTH EVERY DAY 90 tablet 3  . omeprazole (PRILOSEC) 10 MG capsule Take 1 capsule (10 mg total) by mouth daily. Needs to schedule with new provider. 90 capsule 0  . oxyCODONE-acetaminophen (PERCOCET/ROXICET) 5-325 MG tablet Take 2 tablets by mouth every 8 (eight) hours as needed for severe pain. 14 tablet 0  . predniSONE (STERAPRED UNI-PAK 21 TAB) 10 MG (21) TBPK tablet Take by mouth daily. Take as directed. 21 tablet 0  . traMADol (ULTRAM) 50 MG tablet Take 1 tablet (50 mg total) by mouth every 6 (six) hours as needed. 15 tablet 0  . triamcinolone cream (KENALOG) 0.1 % Apply 1 application topically 2 (two) times daily. 30 g 0   Current Facility-Administered Medications on File Prior to Visit  Medication Dose Route Frequency Provider Last Rate Last Dose  . 0.9 %  sodium chloride infusion  500 mL Intravenous  Continuous Pyrtle, Lajuan Lines, MD        Past Surgical History:  Procedure Laterality Date  . CARPAL TUNNEL RELEASE     both   . COLONOSCOPY      Allergies  Allergen Reactions  . Metformin And Related Diarrhea  . Jardiance [Empagliflozin] Rash    Social History   Socioeconomic History  . Marital status: Legally Separated    Spouse name: Not on file  . Number of children: 4  . Years of education: 31  . Highest education level: Not on file  Occupational History  . Occupation: Chiropractor  . Financial resource strain: Not on file  . Food insecurity    Worry: Not on file    Inability: Not on file  . Transportation needs    Medical: Not on file     Non-medical: Not on file  Tobacco Use  . Smoking status: Current Every Day Smoker    Packs/day: 0.25  . Smokeless tobacco: Never Used  Substance and Sexual Activity  . Alcohol use: Yes    Comment: occasionally on weekend  . Drug use: No  . Sexual activity: Not on file  Lifestyle  . Physical activity    Days per week: Not on file    Minutes per session: Not on file  . Stress: Not on file  Relationships  . Social Herbalist on phone: Not on file    Gets together: Not on file    Attends religious service: Not on file    Active member of club or organization: Not on file    Attends meetings of clubs or organizations: Not on file    Relationship status: Not on file  . Intimate partner violence    Fear of current or ex partner: Not on file    Emotionally abused: Not on file    Physically abused: Not on file    Forced sexual activity: Not on file  Other Topics Concern  . Not on file  Social History Narrative   ** Merged History Encounter **       Fun: Listen to music and be around family and friends. Denies religious beliefs effecting health care.     Family History  Problem Relation Age of Onset  . Breast cancer Mother   . Heart disease Brother   . Lung cancer Father   . Healthy Paternal Grandmother   . Cancer Brother   . Colon cancer Neg Hx   . Esophageal cancer Neg Hx   . Stomach cancer Neg Hx   . Rectal cancer Neg Hx     BP (!) 142/94   Ht '5\' 11"'  (1.803 m)   Wt 210 lb (95.3 kg)   BMI 29.29 kg/m   Review of Systems: See HPI above.     Objective:  Physical Exam:  Gen: NAD, comfortable in exam room  Back: No gross deformity, scoliosis. No TTP .  No midline or bony TTP. FROM. Strength LEs 5/5 all muscle groups with pain on left hip flexion.   1+ MSRs in patellar and achilles tendons, equal bilaterally. Negative SLRs. Sensation intact to light touch bilaterally.  Left hip: No deformity. FROM with 5/5 strength. No tenderness to  palpation. NVI distally. Pain with internal and external rotation.   Positive fadir. Negative fabers and piriformis stretches.   Assessment & Plan:  1. Left hip pain - independently reviewed radiographs and noted only mild arthritis.  His exam is consistent  with intraarticular pathology though with some neuropathic symptoms.  Given severity of pain not responding to conservative measures, pain with hip motions, h/o DM, risk for AVN will go ahead with MRI.  Tylenol, naproxen, icing.  Hydrocodone as needed for severe pain - advised not to take tylenol with this.  Out of work 1 week.

## 2018-10-14 NOTE — Addendum Note (Signed)
Addended by: Cyd Silence on: 10/14/2018 06:27 PM   Modules accepted: Orders

## 2018-10-15 ENCOUNTER — Ambulatory Visit
Admission: RE | Admit: 2018-10-15 | Discharge: 2018-10-15 | Disposition: A | Discharge status: Home / Self Care | Payer: BC Managed Care – PPO | Source: Ambulatory Visit | Attending: Family Medicine | Admitting: Family Medicine

## 2018-10-15 ENCOUNTER — Other Ambulatory Visit: Payer: Self-pay | Admitting: *Deleted

## 2018-10-15 ENCOUNTER — Other Ambulatory Visit: Payer: Self-pay

## 2018-10-15 DIAGNOSIS — M25552 Pain in left hip: Secondary | ICD-10-CM

## 2018-10-15 NOTE — Progress Notes (Signed)
dg 

## 2018-10-18 ENCOUNTER — Other Ambulatory Visit: Payer: Self-pay

## 2018-10-18 ENCOUNTER — Ambulatory Visit (INDEPENDENT_AMBULATORY_CARE_PROVIDER_SITE_OTHER): Payer: BC Managed Care – PPO | Admitting: Family Medicine

## 2018-10-18 ENCOUNTER — Encounter: Payer: Self-pay | Admitting: Family Medicine

## 2018-10-18 VITALS — BP 120/76 | Ht 71.0 in | Wt 210.0 lb

## 2018-10-18 DIAGNOSIS — M25552 Pain in left hip: Secondary | ICD-10-CM | POA: Diagnosis not present

## 2018-10-18 MED ORDER — METHYLPREDNISOLONE ACETATE 80 MG/ML IJ SUSP
80.0000 mg | Freq: Once | INTRAMUSCULAR | Status: AC
  Administered 2018-10-18: 80 mg via INTRAMUSCULAR

## 2018-10-18 NOTE — Patient Instructions (Signed)
Your hip imaging looks great suggesting this is from an irritated nerve from your back. You were given an IM injection of depomedrol today. Call us later this week or early next week to let us know how you're doing. You can continue the naproxen with the hydrocodone as needed. Next steps would be physical therapy and/or MRI of your lumbar spine if you're not improving.

## 2018-10-19 ENCOUNTER — Encounter: Payer: Self-pay | Admitting: Family Medicine

## 2018-10-19 NOTE — Progress Notes (Signed)
Patient came in today to review imaging results - MRI is very reassuring, no evidence of AVN and minimal arthritis noted.  He continues to have pain into the hip radiating down the leg, some back pain.  Consistent with referred pain from his low back given the MRI findings.  He would like to try IM depomedrol before starting physical therapy or considering MRI lumbar spine with ESI.  He will let us know how he's doing over next week.  Naproxen with norco as needed.

## 2018-11-02 ENCOUNTER — Other Ambulatory Visit: Payer: BC Managed Care – PPO

## 2018-11-16 ENCOUNTER — Other Ambulatory Visit: Payer: Self-pay | Admitting: Internal Medicine

## 2018-11-26 ENCOUNTER — Other Ambulatory Visit: Payer: Self-pay | Admitting: Internal Medicine

## 2018-12-10 ENCOUNTER — Other Ambulatory Visit: Payer: Self-pay | Admitting: Internal Medicine

## 2018-12-10 DIAGNOSIS — I10 Essential (primary) hypertension: Secondary | ICD-10-CM

## 2018-12-22 ENCOUNTER — Other Ambulatory Visit: Payer: Self-pay | Admitting: Internal Medicine

## 2018-12-26 ENCOUNTER — Other Ambulatory Visit: Payer: Self-pay | Admitting: Internal Medicine

## 2018-12-26 DIAGNOSIS — E785 Hyperlipidemia, unspecified: Secondary | ICD-10-CM

## 2018-12-27 ENCOUNTER — Other Ambulatory Visit: Payer: Self-pay | Admitting: Family Medicine

## 2018-12-27 ENCOUNTER — Other Ambulatory Visit: Payer: Self-pay | Admitting: Internal Medicine

## 2018-12-27 DIAGNOSIS — E785 Hyperlipidemia, unspecified: Secondary | ICD-10-CM

## 2019-01-05 ENCOUNTER — Other Ambulatory Visit: Payer: Self-pay | Admitting: Internal Medicine

## 2019-01-05 DIAGNOSIS — I10 Essential (primary) hypertension: Secondary | ICD-10-CM

## 2019-01-20 ENCOUNTER — Other Ambulatory Visit: Payer: Self-pay | Admitting: Internal Medicine

## 2019-01-28 ENCOUNTER — Other Ambulatory Visit: Payer: Self-pay | Admitting: Internal Medicine

## 2019-01-28 DIAGNOSIS — I10 Essential (primary) hypertension: Secondary | ICD-10-CM

## 2019-02-14 ENCOUNTER — Other Ambulatory Visit: Payer: Self-pay | Admitting: Internal Medicine

## 2019-02-28 ENCOUNTER — Telehealth: Payer: Self-pay

## 2019-02-28 NOTE — Telephone Encounter (Signed)
Copied from [place] [id number]. Topic: General - Other >> Feb 28, 2019 10:17 AM Keene Breath wrote: Reason for CRM: Patient called to make an appt. For his medication refill.  He needs to get re-established with a PCP since [facility] is no longer there.  Please advise and call patient to confirm.  CB# [id number] >> Feb 28, 2019 10:20 AM Para Skeans A wrote: Will Jodi Mourning be willing to accept this TOC?

## 2019-03-07 NOTE — Telephone Encounter (Signed)
I can see him for a diabetes follow-up.   The bigger concern that we need to know from him is so we can make sure to address his needs appropriately at his follow-up:  1) Did he ever follow-up about his Hep C? He was referred to ID for evaluation and treatment. 2) Did he ever see urology about his elevated PSA levels?

## 2019-03-07 NOTE — Telephone Encounter (Signed)
I have made an appointment for Nathaniel Waters for 12/14.   Patient has only been going to an urgent care for treatment.  He also did not FU on Hep treatment.

## 2019-03-21 ENCOUNTER — Ambulatory Visit (INDEPENDENT_AMBULATORY_CARE_PROVIDER_SITE_OTHER): Payer: BC Managed Care – PPO | Admitting: Family

## 2019-03-21 ENCOUNTER — Other Ambulatory Visit (INDEPENDENT_AMBULATORY_CARE_PROVIDER_SITE_OTHER): Payer: BC Managed Care – PPO

## 2019-03-21 ENCOUNTER — Other Ambulatory Visit: Payer: Self-pay

## 2019-03-21 ENCOUNTER — Encounter: Payer: Self-pay | Admitting: Family

## 2019-03-21 VITALS — BP 138/84 | HR 88 | Temp 98.1°F | Ht 71.0 in | Wt 218.8 lb

## 2019-03-21 DIAGNOSIS — I10 Essential (primary) hypertension: Secondary | ICD-10-CM

## 2019-03-21 DIAGNOSIS — R899 Unspecified abnormal finding in specimens from other organs, systems and tissues: Secondary | ICD-10-CM | POA: Diagnosis not present

## 2019-03-21 DIAGNOSIS — R972 Elevated prostate specific antigen [PSA]: Secondary | ICD-10-CM

## 2019-03-21 DIAGNOSIS — E119 Type 2 diabetes mellitus without complications: Secondary | ICD-10-CM

## 2019-03-21 DIAGNOSIS — E785 Hyperlipidemia, unspecified: Secondary | ICD-10-CM

## 2019-03-21 DIAGNOSIS — E1165 Type 2 diabetes mellitus with hyperglycemia: Secondary | ICD-10-CM

## 2019-03-21 LAB — CBC WITH DIFFERENTIAL/PLATELET
Basophils Absolute: 0.1 10*3/uL (ref 0.0–0.1)
Basophils Relative: 0.9 % (ref 0.0–3.0)
Eosinophils Absolute: 0.1 10*3/uL (ref 0.0–0.7)
Eosinophils Relative: 1.1 % (ref 0.0–5.0)
HCT: 48.6 % (ref 39.0–52.0)
Hemoglobin: 16.5 g/dL (ref 13.0–17.0)
Lymphocytes Relative: 35.8 % (ref 12.0–46.0)
Lymphs Abs: 3 10*3/uL (ref 0.7–4.0)
MCHC: 34.1 g/dL (ref 30.0–36.0)
MCV: 94.9 fl (ref 78.0–100.0)
Monocytes Absolute: 0.9 10*3/uL (ref 0.1–1.0)
Monocytes Relative: 10.9 % (ref 3.0–12.0)
Neutro Abs: 4.3 10*3/uL (ref 1.4–7.7)
Neutrophils Relative %: 51.3 % (ref 43.0–77.0)
Platelets: 189 10*3/uL (ref 150.0–400.0)
RBC: 5.12 Mil/uL (ref 4.22–5.81)
RDW: 13 % (ref 11.5–15.5)
WBC: 8.3 10*3/uL (ref 4.0–10.5)

## 2019-03-21 LAB — LIPID PANEL
Cholesterol: 158 mg/dL (ref 0–200)
HDL: 40.7 mg/dL (ref >39.00)
LDL Cholesterol: 100 mg/dL — ABNORMAL HIGH (ref 0–99)
NonHDL: 117.45
Total CHOL/HDL Ratio: 4
Triglycerides: 89 mg/dL (ref 0.0–149.0)
VLDL: 17.8 mg/dL (ref 0.0–40.0)

## 2019-03-21 LAB — COMPREHENSIVE METABOLIC PANEL
ALT: 33 U/L (ref 0–53)
AST: 27 U/L (ref 0–37)
Albumin: 4.1 g/dL (ref 3.5–5.2)
Alkaline Phosphatase: 89 U/L (ref 39–117)
BUN: 23 mg/dL (ref 6–23)
CO2: 26 mEq/L (ref 19–32)
Calcium: 9.5 mg/dL (ref 8.4–10.5)
Chloride: 105 mEq/L (ref 96–112)
Creatinine, Ser: 1.01 mg/dL (ref 0.40–1.50)
GFR: 89.98 mL/min (ref >60.00)
Glucose, Bld: 128 mg/dL — ABNORMAL HIGH (ref 70–99)
Potassium: 4 mEq/L (ref 3.5–5.1)
Sodium: 138 mEq/L (ref 135–145)
Total Bilirubin: 0.4 mg/dL (ref 0.2–1.2)
Total Protein: 7 g/dL (ref 6.0–8.3)

## 2019-03-21 LAB — HEMOGLOBIN A1C: Hgb A1c MFr Bld: 6.6 % — ABNORMAL HIGH (ref 4.6–6.5)

## 2019-03-21 LAB — PSA: PSA: 10.76 ng/mL — ABNORMAL HIGH (ref 0.10–4.00)

## 2019-03-21 MED ORDER — AMLODIPINE BESYLATE 5 MG PO TABS: 5.0000 mg | ORAL_TABLET | Freq: Every day | ORAL | 3 refills | Status: DC

## 2019-03-21 MED ORDER — GLIPIZIDE ER 5 MG PO TB24: 5.0000 mg | ORAL_TABLET | Freq: Every day | ORAL | 1 refills | Status: DC

## 2019-03-21 MED ORDER — LISINOPRIL 10 MG PO TABS: 10.0000 mg | ORAL_TABLET | Freq: Every day | ORAL | 3 refills | Status: DC

## 2019-03-21 MED ORDER — FARXIGA 10 MG PO TABS: ORAL_TABLET | ORAL | 1 refills | Status: DC

## 2019-03-21 MED ORDER — ATORVASTATIN CALCIUM 40 MG PO TABS: 40.0000 mg | ORAL_TABLET | Freq: Every day | ORAL | 3 refills | Status: DC

## 2019-03-21 NOTE — Progress Notes (Signed)
Nathaniel Waters is a 63 y.o. male with the following history as recorded in EpicCare:  Patient Active Problem List   Diagnosis Date Noted  . Hypertension 04/09/2017  . Cigarette nicotine dependence with nicotine-induced disorder 04/09/2017  . Erectile dysfunction associated with type 2 diabetes mellitus ([place]) 10/23/2014  . Dyslipidemia 09/22/2014  . Elevated PSA 09/22/2014  . Routine general medical examination at a health care facility 09/21/2014  . Encounter for general adult medical examination with abnormal findings 09/21/2014  . GERD 12/30/2007  . Type II diabetes mellitus, uncontrolled ([place]) 12/29/2007  . TUBULOVILLOUS ADENOMA, COLON, HX OF 12/29/2007    Current Outpatient Medications  Medication Sig Dispense Refill  . amLODipine (NORVASC) 5 MG tablet Take 1 tablet (5 mg total) by mouth daily. 90 tablet 3  . atorvastatin (LIPITOR) 40 MG tablet Take 1 tablet (40 mg total) by mouth daily. 90 tablet 3  . Blood Glucose Monitoring Suppl (ACCU-CHEK AVIVA PLUS) w/Device KIT Use to check blood sugars twice a day 1 kit 0  . Blood Glucose Monitoring Suppl (ONE TOUCH ULTRA MINI) W/DEVICE KIT 1 Device by Does not apply route once. 1 each 0  . dapagliflozin propanediol (FARXIGA) 10 MG TABS tablet TAKE 1 TABLET BY MOUTH EVERY DAY 90 tablet 1  . glipiZIDE (GLUCOTROL XL) 5 MG 24 hr tablet Take 1 tablet (5 mg total) by mouth daily with breakfast. 90 tablet 1  . glucose blood (ACCU-CHEK AVIVA PLUS) test strip Use to check blood sugars twice a day 100 each 3  . hydrocortisone 2.5 % cream Apply topically 2 (two) times daily. 30 g 0  . Lancets (ACCU-CHEK MULTICLIX) lancets Use to help check blood sugars twice a day 102 each 5  . lisinopril (ZESTRIL) 10 MG tablet Take 1 tablet (10 mg total) by mouth daily. 90 tablet 3  . omeprazole (PRILOSEC) 10 MG capsule TAKE 1 CAPSULE (10 MG TOTAL) BY MOUTH DAILY. NEEDS TO SCHEDULE WITH NEW PROVIDER 30 capsule 0  . triamcinolone cream (KENALOG) 0.1 % Apply 1 application  topically 2 (two) times daily. 30 g 0   No current facility-administered medications for this visit.    Allergies: Metformin and related and Jardiance [empagliflozin]  Past Medical History:  Diagnosis Date  . Diabetes mellitus   . Diabetes mellitus without complication ([place])   . GERD (gastroesophageal reflux disease)   . Hypertension     Past Surgical History:  Procedure Laterality Date  . CARPAL TUNNEL RELEASE     both   . COLONOSCOPY      Family History  Problem Relation Age of Onset  . Breast cancer Mother   . Heart disease Brother   . Lung cancer Father   . Healthy Paternal Grandmother   . Cancer Brother   . Colon cancer Neg Hx   . Esophageal cancer Neg Hx   . Stomach cancer Neg Hx   . Rectal cancer Neg Hx     Social History   Tobacco Use  . Smoking status: Light Tobacco Smoker    Packs/day: 0.25  . Smokeless tobacco: Never Used  . Tobacco comment: Actively in the process of quitting  Substance Use Topics  . Alcohol use: Yes    Comment: occasionally on weekend    Subjective:  Follow up on chronic care needs including:  1) Hypertension- not currently on medication; Denies any chest pain, shortness of breath, blurred vision or headache 2) Type 2 Diabetes- on Farxiga and Glucotrol XL; fasting blood sugar this am was 119;  no concerns for low blood sugar;  3) Hyperlipidemia- on Atoravastatin; 4) GERD- uses Omeprazole prn;  5) Elevated PSA- has seen Dr. Karsten Ro in the past year and will be due for follow-up soon 6) Tobacco Abuse- in the process of quitting- now smoking every 2-3 days;  7) Hep C- test was reactive last year; patient did not follow-up for treatment; is questioning if the test is accurate   Objective:  Vitals:   03/21/19 1359  BP: 138/84  Pulse: 88  Temp: 98.1 F (36.7 C)  TempSrc: Oral  SpO2: 96%  Weight: 218 lb 12.8 oz (99.2 kg)  Height: '5\' 11"'  (1.803 m)    General: Well developed, well nourished, in no acute distress  Skin : Warm and  dry.  Head: Normocephalic and atraumatic  Lungs: Respirations unlabored; clear to auscultation bilaterally without wheeze, rales, rhonchi  CVS exam: normal rate and regular rhythm.  Abdomen: Soft; nontender; nondistended; normoactive bowel sounds; no masses or hepatosplenomegaly  Musculoskeletal: No deformities; no active joint inflammation  Extremities: No edema, cyanosis, clubbing  Vessels: Symmetric bilaterally  Neurologic: Alert and oriented; speech intact; face symmetrical; moves all extremities well; CNII-XII intact without focal deficit  Assessment:  1. Essential hypertension   2. Dyslipidemia   3. Type 2 diabetes mellitus without complication, without long-term current use of insulin ([place])   4. Abnormal laboratory test result   5. Hypertension, unspecified type   6. Uncontrolled type 2 diabetes mellitus with hyperglycemia ([facility])   7. Elevated PSA     Plan:  1. Stable; refills updated; 2. Check lipid panel; refill updated; 3. Check CMP, hgba1c; refills updated; 4. Check Hep C antibody- stressed need to follow-up for treatment; 7. Check PSA- will forward results to Dr. Karsten Ro.   This visit occurred during the SARS-CoV-2 public health emergency.  Safety protocols were in place, including screening questions prior to the visit, additional usage of staff PPE, and extensive cleaning of exam room while observing appropriate contact time as indicated for disinfecting solutions.    No follow-ups on file.  Orders Placed This Encounter  Procedures  . CBC w/Diff    Standing Status:   Future    Standing Expiration Date:   03/20/2020  . Comp Met (CMET)    Standing Status:   Future    Standing Expiration Date:   03/20/2020  . Lipid panel    Standing Status:   Future    Standing Expiration Date:   03/20/2020  . HgB A1c    Standing Status:   Future    Standing Expiration Date:   03/20/2020  . Hepatitis C Antibody    Standing Status:   Future    Standing Expiration Date:    03/20/2020  . PSA    Standing Status:   Future    Standing Expiration Date:   03/20/2020    Requested Prescriptions   Signed Prescriptions Disp Refills  . dapagliflozin propanediol (FARXIGA) 10 MG TABS tablet 90 tablet 1    Sig: TAKE 1 TABLET BY MOUTH EVERY DAY  . lisinopril (ZESTRIL) 10 MG tablet 90 tablet 3    Sig: Take 1 tablet (10 mg total) by mouth daily.  Marland Kitchen atorvastatin (LIPITOR) 40 MG tablet 90 tablet 3    Sig: Take 1 tablet (40 mg total) by mouth daily.  Marland Kitchen amLODipine (NORVASC) 5 MG tablet 90 tablet 3    Sig: Take 1 tablet (5 mg total) by mouth daily.  Marland Kitchen glipiZIDE (GLUCOTROL XL) 5 MG 24 hr tablet  90 tablet 1    Sig: Take 1 tablet (5 mg total) by mouth daily with breakfast.

## 2019-03-21 NOTE — Patient Instructions (Signed)

## 2019-03-22 ENCOUNTER — Other Ambulatory Visit: Payer: Self-pay | Admitting: Family

## 2019-03-22 DIAGNOSIS — B1920 Unspecified viral hepatitis C without hepatic coma: Secondary | ICD-10-CM

## 2019-03-26 ENCOUNTER — Other Ambulatory Visit: Payer: Self-pay | Admitting: Internal Medicine

## 2019-03-28 LAB — HCV RNA,QUANTITATIVE REAL TIME PCR
HCV Quantitative Log: 6.89 Log IU/mL — ABNORMAL HIGH
HCV RNA, PCR, QN: 7830000 IU/mL — ABNORMAL HIGH

## 2019-03-28 LAB — HEPATITIS C ANTIBODY
Hepatitis C Ab: REACTIVE — AB
SIGNAL TO CUT-OFF: 25.7 — ABNORMAL HIGH (ref <1.00)

## 2019-04-11 ENCOUNTER — Encounter: Payer: Self-pay | Admitting: Family

## 2019-04-11 ENCOUNTER — Other Ambulatory Visit: Payer: Self-pay

## 2019-04-11 ENCOUNTER — Ambulatory Visit: Payer: BC Managed Care – PPO | Admitting: Family

## 2019-04-11 DIAGNOSIS — B182 Chronic viral hepatitis C: Secondary | ICD-10-CM | POA: Diagnosis not present

## 2019-04-11 NOTE — Progress Notes (Signed)
Subjective:    Patient ID: Nathaniel Waters, male    DOB: 11/02/1955, 64 y.o.   MRN: [id number]  Chief Complaint  Patient presents with  . Hepatitis C    HPI:  Nathaniel Waters is a 64 y.o. male with previous medical history of hypertension, diabetes, and gastroesophageal reflux presenting today for initial evaluation and treatment of hepatitis C.  Nathaniel Waters was initially diagnosed with hepatitis C in November 2019 during routine screening through his primary care provider.  Initially noted to have a viral load of 4.9 million.  Nathaniel Waters remains treatment nave and asymptomatic and denies scleral icterus, jaundice, nausea, vomiting, or abdominal pain.Marland Kitchen  No personal or family history of liver disease.  Denies IV drug use, blood transfusions prior to 1992, tattoos, or sharing of razors/toothbrushes.  Largest risk factor is age being born between [birth year] and 65.  No recreational or illicit drug use, tobacco use, or alcohol consumption at present.  Currently covered through [facility].   Allergies  Allergen Reactions  . Metformin And Related Diarrhea  . Jardiance [Empagliflozin] Rash      Outpatient Medications Prior to Visit  Medication Sig Dispense Refill  . amLODipine (NORVASC) 5 MG tablet Take 1 tablet (5 mg total) by mouth daily. 90 tablet 3  . atorvastatin (LIPITOR) 40 MG tablet Take 1 tablet (40 mg total) by mouth daily. 90 tablet 3  . Blood Glucose Monitoring Suppl (ACCU-CHEK AVIVA PLUS) w/Device KIT Use to check blood sugars twice a day 1 kit 0  . Blood Glucose Monitoring Suppl (ONE TOUCH ULTRA MINI) W/DEVICE KIT 1 Device by Does not apply route once. 1 each 0  . dapagliflozin propanediol (FARXIGA) 10 MG TABS tablet TAKE 1 TABLET BY MOUTH EVERY DAY 90 tablet 1  . glipiZIDE (GLUCOTROL XL) 5 MG 24 hr tablet Take 1 tablet (5 mg total) by mouth daily with breakfast. 90 tablet 1  . glucose blood (ACCU-CHEK AVIVA PLUS) test strip Use to check blood sugars twice a day 100 each 3  .  hydrocortisone 2.5 % cream Apply topically 2 (two) times daily. 30 g 0  . Lancets (ACCU-CHEK MULTICLIX) lancets Use to help check blood sugars twice a day 102 each 5  . lisinopril (ZESTRIL) 10 MG tablet Take 1 tablet (10 mg total) by mouth daily. 90 tablet 3  . omeprazole (PRILOSEC) 10 MG capsule TAKE 1 CAPSULE (10 MG TOTAL) BY MOUTH DAILY. NEEDS TO SCHEDULE WITH NEW PROVIDER 30 capsule 0  . triamcinolone cream (KENALOG) 0.1 % Apply 1 application topically 2 (two) times daily. 30 g 0   No facility-administered medications prior to visit.     Past Medical History:  Diagnosis Date  . Diabetes mellitus   . Diabetes mellitus without complication ([place])   . GERD (gastroesophageal reflux disease)   . Hypertension       Past Surgical History:  Procedure Laterality Date  . CARPAL TUNNEL RELEASE     both   . COLONOSCOPY        Family History  Problem Relation Age of Onset  . Breast cancer Mother   . Heart disease Brother   . Lung cancer Father   . Healthy Paternal Grandmother   . Cancer Brother   . Colon cancer Neg Hx   . Esophageal cancer Neg Hx   . Stomach cancer Neg Hx   . Rectal cancer Neg Hx       Social History   Socioeconomic History  . Marital status: Legally Separated  Spouse name: Not on file  . Number of children: 4  . Years of education: 49  . Highest education level: Not on file  Occupational History  . Occupation: Loading [employer]  Tobacco Use  . Smoking status: Light Tobacco Smoker    Packs/day: 0.25  . Smokeless tobacco: Never Used  . Tobacco comment: Actively in the process of quitting  Substance and Sexual Activity  . Alcohol use: Yes    Comment: occasionally on weekend  . Drug use: No  . Sexual activity: Not on file  Other Topics Concern  . Not on file  Social History Narrative   ** Merged History Encounter **       Fun: Listen to music and be around family and friends. Denies religious beliefs effecting health care.    Social  Determinants of Health   Financial Resource Strain:   . Difficulty of Paying Living Expenses: Not on file  Food Insecurity:   . Worried About Charity fundraiser in the Last Year: Not on file  . Ran Out of Food in the Last Year: Not on file  Transportation Needs:   . Lack of Transportation (Medical): Not on file  . Lack of Transportation (Non-Medical): Not on file  Physical Activity:   . Days of Exercise per Week: Not on file  . Minutes of Exercise per Session: Not on file  Stress:   . Feeling of Stress : Not on file  Social Connections:   . Frequency of Communication with Friends and Family: Not on file  . Frequency of Social Gatherings with Friends and Family: Not on file  . Attends Religious Services: Not on file  . Active Member of Clubs or Organizations: Not on file  . Attends Archivist Meetings: Not on file  . Marital Status: Not on file  Intimate Partner Violence:   . Fear of Current or Ex-Partner: Not on file  . Emotionally Abused: Not on file  . Physically Abused: Not on file  . Sexually Abused: Not on file      Review of Systems  Constitutional: Negative for chills, diaphoresis, fatigue and fever.  Respiratory: Negative for cough, chest tightness, shortness of breath and wheezing.   Cardiovascular: Negative for chest pain.  Gastrointestinal: Negative for abdominal distention, abdominal pain, constipation, diarrhea, nausea and vomiting.  Neurological: Negative for weakness and headaches.  Hematological: Does not bruise/bleed easily.       Objective:    There were no vitals taken for this visit. Nursing note and vital signs reviewed.  Physical Exam Constitutional:      Waters: Nathaniel Waters is not in acute distress.    Appearance: Nathaniel Waters is well-developed.  Cardiovascular:     Rate and Rhythm: Normal rate and regular rhythm.     Heart sounds: Normal heart sounds. No murmur. No friction rub. No gallop.   Pulmonary:     Effort: Pulmonary effort is normal. No  respiratory distress.     Breath sounds: Normal breath sounds. No wheezing or rales.  Chest:     Chest wall: No tenderness.  Abdominal:     Waters: Bowel sounds are normal. There is no distension.     Palpations: Abdomen is soft. There is no mass.     Tenderness: There is no abdominal tenderness. There is no guarding or rebound.  Skin:    Waters: Skin is warm and dry.  Neurological:     Mental Status: Nathaniel Waters is alert and oriented to person, place, and time.  Psychiatric:        Behavior: Behavior normal.        Thought Content: Thought content normal.        Judgment: Judgment normal.        Assessment & Plan:   Patient Active Problem List   Diagnosis Date Noted  . Chronic hepatitis C without hepatic coma ([place]) 04/11/2019  . Hypertension 04/09/2017  . Cigarette nicotine dependence with nicotine-induced disorder 04/09/2017  . Erectile dysfunction associated with type 2 diabetes mellitus ([place]) 10/23/2014  . Dyslipidemia 09/22/2014  . Elevated PSA 09/22/2014  . Routine Waters medical examination at a health care facility 09/21/2014  . Encounter for Waters adult medical examination with abnormal findings 09/21/2014  . GERD 12/30/2007  . Type II diabetes mellitus, uncontrolled ([place]) 12/29/2007  . TUBULOVILLOUS ADENOMA, COLON, HX OF 12/29/2007     Problem List Items Addressed This Visit      Digestive   Chronic hepatitis C without hepatic coma ([place]) - Primary    Nathaniel Waters has chronic hepatitis C with a viral load of 7.8 million.  His largest risk factor for acquiring hepatitis C is age born between [birth year] and 23.  Currently treatment nave and asymptomatic.  We discussed the pathophysiology, risk if left untreated, transmission, prevention, and treatment options for hepatitis C.  Reviewed the plan of care with time for questions.  Obtain blood work today including genotype, hepatitis B status, and fibrotest.  Plan for treatment with Mavyret or Epclusa pending blood work results.   Follow-up in 1 month after initiation of medication.      Relevant Orders   Liver Fibrosis, FibroTest-ActiTest   Hepatitis C genotype   Hepatitis B surface antibody   Hepatitis B surface antigen       I am having Nathaniel Waters maintain his ONE TOUCH ULTRA MINI, hydrocortisone, Accu-Chek Aviva Plus, accu-chek multiclix, glucose blood, triamcinolone cream, Farxiga, lisinopril, atorvastatin, amLODipine, glipiZIDE, and omeprazole.   Follow-up: Return in about 1 month (around 05/12/2019), or if symptoms worsen or fail to improve.    Terri Piedra, MSN, FNP-C Nurse Practitioner [facility] [place] number: [phone]

## 2019-04-11 NOTE — Patient Instructions (Addendum)
Nice to see you.  We will check your blood work today and call with the results.   We will follow up 1 month after starting medication.  Limit acetaminophen (Tylenol) usage to no more than 2 grams (2,000 mg) per day.  Avoid alcohol.  Do not share toothbrushes or razors.  Practice safe sex to protect against transmission as well as sexually transmitted disease.    Hepatitis C Hepatitis C is a viral infection of the liver. It can lead to scarring of the liver (cirrhosis), liver failure, or liver cancer. Hepatitis C may go undetected for months or years because people with the infection may not have symptoms, or they may have only mild symptoms. What are the causes? This condition is caused by the hepatitis C virus (HCV). The virus can spread from person to person (is contagious) through:  Blood.  Childbirth. A woman who has hepatitis C can pass it to her baby during birth.  Bodily fluids, such as breast milk, tears, semen, vaginal fluids, and saliva.  Blood transfusions or organ transplants done in the [place] before 1992.  What increases the risk? The following factors may make you more likely to develop this condition:  Having contact with unclean (contaminated) needles or syringes. This may result from: ? Acupuncture. ? Tattoing. ? Body piercing. ? Injecting drugs.  Having unprotected sex with someone who is infected.  Needing treatment to filter your blood (kidney dialysis).  Having HIV (human immunodeficiency virus) or AIDS (acquired immunodeficiency syndrome).  Working in a job that involves contact with blood or bodily fluids, such as health care.  What are the signs or symptoms? Symptoms of this condition include:  Fatigue.  Loss of appetite.  Nausea.  Vomiting.  Abdominal pain.  Dark yellow urine.  Yellowish skin and eyes (jaundice).  Itchy skin.  Clay-colored bowel movements.  Joint pain.  Bleeding and bruising easily.  Fluid  building up in your stomach (ascites).  In some cases, you may not have any symptoms. How is this diagnosed? This condition is diagnosed with:  Blood tests.  Other tests to check how well your liver is functioning. They may include: ? Magnetic resonance elastography (MRE). This imaging test uses MRIs and sound waves to measure liver stiffness. ? Transient elastography. This imaging test uses ultrasounds to measure liver stiffness. ? Liver biopsy. This test requires taking a small tissue sample from your liver to examine it under a microscope.  How is this treated? Your health care provider may perform noninvasive tests or a liver biopsy to help decide the best course of treatment. Treatment may include:  Antiviral medicines and other medicines.  Follow-up treatments every 6-12 months for infections or other liver conditions.  Receiving a donated liver (liver transplant).  Follow these instructions at home: Medicines  Take over-the-counter and prescription medicines only as told by your health care provider.  Take your antiviral medicine as told by your health care provider. Do not stop taking the antiviral even if you start to feel better.  Do not take any medicines unless approved by your health care provider, including over-the-counter medicines and birth control pills. Activity  Rest as needed.  Do not have sex unless approved by your health care provider.  Ask your health care provider when you may return to school or work. Eating and drinking  Eat a balanced diet with plenty of fruits and vegetables, whole grains, and lowfat (lean) meats or non-meat proteins (such as beans or tofu).  Drink enough  fluids to keep your urine clear or pale yellow.  Do not drink alcohol. General instructions  Do not share toothbrushes, nail clippers, or razors.  Wash your hands frequently with soap and water. If soap and water are not available, use hand sanitizer.  Cover any cuts or  open sores on your skin to prevent spreading the virus.  Keep all follow-up visits as told by your health care provider. This is important. You may need follow-up visits every 6-12 months. How is this prevented? There is no vaccine for hepatitis C. The only way to prevent the disease is to reduce the risk of exposure to the virus. Make sure you:  Wash your hands frequently with soap and water. If soap and water are not available, use hand sanitizer.  Do not share needles or syringes.  Practice safe sex and use condoms.  Avoid handling blood or bodily fluids without gloves or other protection.  Avoid getting tattoos or piercings in shops or other locations that are not clean.  Contact a health care provider if:  You have a fever.  You develop abdominal pain.  You pass dark urine.  You pass clay-colored stools.  You develop joint pain. Get help right away if:  You have increasing fatigue or weakness.  You lose your appetite.  You cannot eat or drink without vomiting.  You develop jaundice or your jaundice gets worse.  You bruise or bleed easily. Summary  Hepatitis C is a viral infection of the liver. It can lead to scarring of the liver (cirrhosis), liver failure, or liver cancer.  The hepatitis C virus (HCV) causes this condition. The virus can pass from person to person (is contagious).  You should not take any medicines unless approved by your health care provider. This includes over-the-counter medicines and birth control pills. This information is not intended to replace advice given to you by your health care provider. Make sure you discuss any questions you have with your health care provider. Document Released: 03/21/2000 Document Revised: 04/29/2016 Document Reviewed: 04/29/2016 Elsevier Interactive Patient Education  [employer].

## 2019-04-11 NOTE — Assessment & Plan Note (Signed)
Mr. Binz has chronic hepatitis C with a viral load of 7.8 million.  His largest risk factor for acquiring hepatitis C is age born between [birth year] and 18.  Currently treatment nave and asymptomatic.  We discussed the pathophysiology, risk if left untreated, transmission, prevention, and treatment options for hepatitis C.  Reviewed the plan of care with time for questions.  Obtain blood work today including genotype, hepatitis B status, and fibrotest.  Plan for treatment with Mavyret or Epclusa pending blood work results.  Follow-up in 1 month after initiation of medication.

## 2019-04-14 LAB — LIVER FIBROSIS, FIBROTEST-ACTITEST
ALT: 39 U/L (ref 9–46)
Alpha-2-Macroglobulin: 376 mg/dL — ABNORMAL HIGH (ref 106–279)
Apolipoprotein A1: 175 mg/dL (ref 94–176)
Bilirubin: 0.5 mg/dL (ref 0.2–1.2)
Fibrosis Score: 0.6
GGT: 115 U/L — ABNORMAL HIGH (ref 3–70)
Haptoglobin: 184 mg/dL (ref 43–212)
Necroinflammat ACT Score: 0.3
Reference ID: 3218556

## 2019-04-14 LAB — HEPATITIS C GENOTYPE

## 2019-04-14 LAB — HEPATITIS B SURFACE ANTIGEN: Hepatitis B Surface Ag: NONREACTIVE

## 2019-04-14 LAB — HEPATITIS B SURFACE ANTIBODY,QUALITATIVE: Hep B S Ab: NONREACTIVE

## 2019-04-29 ENCOUNTER — Other Ambulatory Visit: Payer: Self-pay | Admitting: Internal Medicine

## 2019-06-11 ENCOUNTER — Other Ambulatory Visit: Payer: Self-pay | Admitting: Family

## 2019-06-15 ENCOUNTER — Other Ambulatory Visit: Payer: Self-pay | Admitting: Pharmacist

## 2019-06-15 DIAGNOSIS — B182 Chronic viral hepatitis C: Secondary | ICD-10-CM

## 2019-06-15 MED ORDER — SOFOSBUVIR-VELPATASVIR 400-100 MG PO TABS: 1.0000 | ORAL_TABLET | Freq: Every day | ORAL | 2 refills | Status: DC

## 2019-06-15 NOTE — Progress Notes (Addendum)
Sent in Epclusa x 12 weeks for patient's chronic Hep C infection.  He will need to be counseled on how to take Epclusa and omeprazole. Also advise to decrease dose of atorvastatin or stop it while on Epclusa.

## 2019-06-17 ENCOUNTER — Telehealth: Payer: Self-pay | Admitting: Pharmacy Technician

## 2019-06-17 NOTE — Telephone Encounter (Addendum)
[facility] Patient Advocate Encounter   Received notification from [employer] that prior authorization for Raeanne Gathers is required.   PA submitted on 06/17/2019 Key [id number] Status is pending 1 to 5 days via fax determination [facility] will continue to follow.  Bartholomew Crews, CPhT Specialty Pharmacy Patient [facility] for Infectious Disease Phone: [phone] Fax: [phone] 06/17/2019 3:23 PM

## 2019-06-21 NOTE — Telephone Encounter (Addendum)
[facility] Patient Advocate Encounter  Prior Authorization for Nathaniel Waters has been approved.    PA# [id number] Effective dates: 06/20/2019 through 09/12/2019  Patients co-pay is $200.00 (coupon will bring to $5)  [facility] Patient Advocate Encounter   Was successful in obtaining a [employer] copay card for [name]  The billing information is as follows and has been shared with [name].  RxBin: [id number] PCN: Nicholas Member ID: [id number] Group ID: [id number]    [name] will continue to follow.

## 2019-06-22 ENCOUNTER — Encounter: Payer: Self-pay | Admitting: Pharmacy Technician

## 2019-06-23 ENCOUNTER — Other Ambulatory Visit: Payer: Self-pay | Admitting: Pharmacist

## 2019-06-23 ENCOUNTER — Telehealth: Payer: Self-pay

## 2019-06-23 DIAGNOSIS — E785 Hyperlipidemia, unspecified: Secondary | ICD-10-CM

## 2019-06-23 MED ORDER — ATORVASTATIN CALCIUM 20 MG PO TABS: 20.0000 mg | ORAL_TABLET | Freq: Every day | ORAL | 2 refills | Status: DC

## 2019-06-23 MED FILL — EPCLUSA 400 MG-100 MG TAB: 400-100 | 28 days supply | Qty: 28 | Fill #0

## 2019-06-23 NOTE — Telephone Encounter (Signed)
CS returned my call to discuss beginning Epclusa x 12 weeks. Overall, he was eager to get through his treatment. I explained to him the proper administration, monitoring, and ADE of Epclusa with an emphasis on adherence.  We discussed the following DDI: 1) CS is a diabetic, so he needs to monitor his BG level and watch for hypoglycemia.  2) Reducing Atorvastatin from 40mg  to 20mg  po QD. MFG recommended taking atorvastatin whole so a new prescription for 20mg  was called into [facility] on [address].  3) Stop taking Omeprazole or separate by 4 hours  CS had a number of questions that were addressed: 1) 2nd COVID dose is April 5th. I instructed him to go ahead and get the shot and there is very little data for a situation like this. I instructed him to call us with any concerns after getting his 2nd dose. 2) Alcohol intake. 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 I suggested that he make an attempt to reduce while taking Epclusa at the very least.   Ned Clines Student Pharmacist, Class of [phone]

## 2019-06-23 NOTE — Progress Notes (Signed)
Decreasing patient's atorvastatin dose to 20 mg daily while on Epclusa for Hepatitis C (drug-drug interaction).

## 2019-06-24 NOTE — Telephone Encounter (Signed)
Agree with Greg's assessment.

## 2019-07-19 MED FILL — EPCLUSA 400 MG-100 MG TAB: 400-100 | 28 days supply | Qty: 28 | Fill #1

## 2019-08-01 ENCOUNTER — Other Ambulatory Visit: Payer: Self-pay

## 2019-08-01 ENCOUNTER — Ambulatory Visit (INDEPENDENT_AMBULATORY_CARE_PROVIDER_SITE_OTHER): Payer: BC Managed Care – PPO | Admitting: Pharmacist

## 2019-08-01 DIAGNOSIS — B182 Chronic viral hepatitis C: Secondary | ICD-10-CM | POA: Diagnosis not present

## 2019-08-01 NOTE — Progress Notes (Signed)
HPI: Nathaniel Waters is a 64 y.o. male who presents to the [facility] for Hepatitis C follow-up.  Medication: Epclusa x12 wks  Start Date: 06/27/19  Hepatitis C Genotype: 1a  Fibrosis Score: F3  Hepatitis C RNA: 7.8 million on 03/21/19  Patient Active Problem List   Diagnosis Date Noted  . Chronic hepatitis C without hepatic coma ([place]) 04/11/2019  . Hypertension 04/09/2017  . Cigarette nicotine dependence with nicotine-induced disorder 04/09/2017  . Erectile dysfunction associated with type 2 diabetes mellitus ([place]) 10/23/2014  . Dyslipidemia 09/22/2014  . Elevated PSA 09/22/2014  . Routine general medical examination at a health care facility 09/21/2014  . Encounter for general adult medical examination with abnormal findings 09/21/2014  . GERD 12/30/2007  . Type II diabetes mellitus, uncontrolled ([place]) 12/29/2007  . TUBULOVILLOUS ADENOMA, COLON, HX OF 12/29/2007    Patient's Medications  New Prescriptions   No medications on file  Previous Medications   AMLODIPINE (NORVASC) 5 MG TABLET    Take 1 tablet (5 mg total) by mouth daily.   ATORVASTATIN (LIPITOR) 20 MG TABLET    Take 1 tablet (20 mg total) by mouth daily.   BLOOD GLUCOSE MONITORING SUPPL (ACCU-CHEK AVIVA PLUS) W/DEVICE KIT    Use to check blood sugars twice a day   BLOOD GLUCOSE MONITORING SUPPL (ONE TOUCH ULTRA MINI) W/DEVICE KIT    1 Device by Does not apply route once.   DAPAGLIFLOZIN PROPANEDIOL (FARXIGA) 10 MG TABS TABLET    TAKE 1 TABLET BY MOUTH EVERY DAY   GLIPIZIDE (GLUCOTROL XL) 5 MG 24 HR TABLET    Take 1 tablet (5 mg total) by mouth daily with breakfast.   GLUCOSE BLOOD (ACCU-CHEK AVIVA PLUS) TEST STRIP    Use to check blood sugars twice a day   HYDROCORTISONE 2.5 % CREAM    Apply topically 2 (two) times daily.   LANCETS (ACCU-CHEK MULTICLIX) LANCETS    Use to help check blood sugars twice a day   LISINOPRIL (ZESTRIL) 10 MG TABLET    Take 1 tablet (10 mg total) by mouth daily.   OMEPRAZOLE  (PRILOSEC) 10 MG CAPSULE    TAKE 1 CAPSULE BY MOUTH EVERY DAY   SOFOSBUVIR-VELPATASVIR (EPCLUSA) 400-100 MG TABS    Take 1 tablet by mouth daily.   TRIAMCINOLONE CREAM (KENALOG) 0.1 %    Apply 1 application topically 2 (two) times daily.  Modified Medications   No medications on file  Discontinued Medications   No medications on file    Allergies: Allergies  Allergen Reactions  . Metformin And Related Diarrhea  . Jardiance [Empagliflozin] Rash    Past Medical History: Past Medical History:  Diagnosis Date  . Diabetes mellitus   . Diabetes mellitus without complication ([place])   . GERD (gastroesophageal reflux disease)   . Hypertension     Social History: Social History   Socioeconomic History  . Marital status: Legally Separated    Spouse name: Not on file  . Number of children: 4  . Years of education: 16  . Highest education level: Not on file  Occupational History  . Occupation: Loading [employer]  Tobacco Use  . Smoking status: Light Tobacco Smoker    Packs/day: 0.25  . Smokeless tobacco: Never Used  . Tobacco comment: Actively in the process of quitting  Substance and Sexual Activity  . Alcohol use: Yes    Comment: occasionally on weekend  . Drug use: No  . Sexual activity: Not on file  Other  Topics Concern  . Not on file  Social History Narrative   ** Merged History Encounter **       Fun: Listen to music and be around family and friends. Denies religious beliefs effecting health care.    Social Determinants of Health   Financial Resource Strain:   . Difficulty of Paying Living Expenses:   Food Insecurity:   . Worried About Charity fundraiser in the Last Year:   . Arboriculturist in the Last Year:   Transportation Needs:   . Film/video editor (Medical):   Marland Kitchen Lack of Transportation (Non-Medical):   Physical Activity:   . Days of Exercise per Week:   . Minutes of Exercise per Session:   Stress:   . Feeling of Stress :   Social Connections:   .  Frequency of Communication with Friends and Family:   . Frequency of Social Gatherings with Friends and Family:   . Attends Religious Services:   . Active Member of Clubs or Organizations:   . Attends Archivist Meetings:   Marland Kitchen Marital Status:     Labs: Hepatitis C Lab Results  Component Value Date   HCVGENOTYPE 1a 04/11/2019   HEPCAB REACTIVE (A) 03/21/2019   HCVRNAPCRQN 7,830,000 (H) 03/21/2019   HCVRNAPCRQN 4,970,000 (H) 02/18/2018   FIBROSTAGE F3 04/11/2019   Hepatitis B Lab Results  Component Value Date   HEPBSAB NON-REACTIVE 04/11/2019   HEPBSAG NON-REACTIVE 04/11/2019   Hepatitis A No results found for: HAV HIV Lab Results  Component Value Date   HIV NON-REACTIVE 02/18/2018   HIV NONREACTIVE 09/21/2014   Lab Results  Component Value Date   CREATININE 1.01 03/21/2019   CREATININE 1.00 02/21/2018   CREATININE 1.40 (H) 02/20/2018   CREATININE 1.28 (H) 02/20/2018   CREATININE 0.97 02/18/2018   Lab Results  Component Value Date   AST 27 03/21/2019   AST 51 (H) 02/20/2018   AST 29 02/18/2018   ALT 39 04/11/2019   ALT 33 03/21/2019   ALT 43 02/20/2018   INR 0.95 02/20/2018    Assessment: Nathaniel Waters is here for 4 week follow-up for Hepatitis C treatment with Epclusa x12 weeks. He reports no missed doses and no side effects, and he has already received his next month's supply. He has not had any issues with hypoglycemia, he has reduced the atorvastatin dose to 20 mg/day, and he has decreased his alcohol use since starting treatment. He reports taking omeprazole only as needed, about 1 time in the past month; reminded him that if he does take omeprazole, to separate it from Nathaniel Waters by 4 hours. We will check his viral load and see him back in about 3 months after EOT.   Plan: - Continue Epclusa x12 weeks - Check HCV RNA today - Follow-up with Cassie on 7/28 at 4:00pm  Vallery Sa PharmD Candidate 08/01/2019, 4:16 PM

## 2019-08-03 LAB — HEPATITIS C RNA QUANTITATIVE
HCV Quantitative Log: <1.18 Log IU/mL
HCV RNA, PCR, QN: <15 IU/mL

## 2019-08-04 ENCOUNTER — Other Ambulatory Visit: Payer: Self-pay | Admitting: Family

## 2019-09-04 ENCOUNTER — Other Ambulatory Visit: Payer: Self-pay

## 2019-09-04 ENCOUNTER — Encounter (HOSPITAL_COMMUNITY): Payer: Self-pay

## 2019-09-04 ENCOUNTER — Ambulatory Visit (HOSPITAL_COMMUNITY)
Admission: EM | Admit: 2019-09-04 | Discharge: 2019-09-04 | Disposition: A | Discharge status: Home / Self Care | Payer: BC Managed Care – PPO | Attending: Family Medicine | Admitting: Family Medicine

## 2019-09-04 DIAGNOSIS — R339 Retention of urine, unspecified: Secondary | ICD-10-CM

## 2019-09-04 LAB — POCT URINALYSIS DIP (DEVICE)
Bilirubin Urine: NEGATIVE
Glucose, UA: 500 mg/dL — AB
Ketones, ur: 40 mg/dL — AB
Leukocytes,Ua: NEGATIVE
Nitrite: NEGATIVE
Protein, ur: 30 mg/dL — AB
Specific Gravity, Urine: 1.02 (ref 1.005–1.030)
Urobilinogen, UA: 0.2 mg/dL (ref 0.0–1.0)
pH: 5 (ref 5.0–8.0)

## 2019-09-04 MED ORDER — TAMSULOSIN HCL 0.4 MG PO CAPS: 0.4000 mg | ORAL_CAPSULE | Freq: Every day | ORAL | 0 refills | Status: DC

## 2019-09-04 NOTE — Discharge Instructions (Addendum)
We placed a Foley catheter here today to drain your bladder.  You will need  to leave this in until you follow-up with urology.  If you are unable to get in with them this week you can come back here and we can remove the catheter and trial to see if you are able to urinate regularly.  Make sure you are drinking plenty of water. Starting you on Flomax.  Take this medication as prescribed.

## 2019-09-04 NOTE — ED Triage Notes (Signed)
Patient reports he has not been able to urinate since last night. Reports he feels like he needs to go and it is causing him pain, but he is unable to once in the restroom.

## 2019-09-04 NOTE — ED Provider Notes (Signed)
Nathaniel Waters    CSN: [id number] Arrival date & time: 09/04/19  1045      History   Chief Complaint Chief Complaint  Patient presents with  . can't urinate    HPI Nathaniel Waters is a 64 y.o. male.   Is a 37-year male with past medical history of diabetes, high blood pressure, GERD.  He presents today with urinary retention.  He has been unable to urinate since yesterday evening before bed.  Has tried urinating today with only very small amounts each time.  He has some bladder distention and pain.  Denies any dysuria, hematuria or urinary frequency.  History of BPH.  No fevers, chills, nausea, vomiting or flank pain.  ROS per HPI      Past Medical History:  Diagnosis Date  . Diabetes mellitus   . Diabetes mellitus without complication ([place])   . GERD (gastroesophageal reflux disease)   . Hypertension     Patient Active Problem List   Diagnosis Date Noted  . Chronic hepatitis C without hepatic coma ([place]) 04/11/2019  . Hypertension 04/09/2017  . Cigarette nicotine dependence with nicotine-induced disorder 04/09/2017  . Erectile dysfunction associated with type 2 diabetes mellitus ([place]) 10/23/2014  . Dyslipidemia 09/22/2014  . Elevated PSA 09/22/2014  . Routine general medical examination at a health care facility 09/21/2014  . Encounter for general adult medical examination with abnormal findings 09/21/2014  . GERD 12/30/2007  . Type II diabetes mellitus, uncontrolled ([place]) 12/29/2007  . TUBULOVILLOUS ADENOMA, COLON, HX OF 12/29/2007    Past Surgical History:  Procedure Laterality Date  . CARPAL TUNNEL RELEASE     both   . COLONOSCOPY         Home Medications    Prior to Admission medications   Medication Sig Start Date End Date Taking? Authorizing Provider  ACCU-CHEK AVIVA PLUS test strip USE TO CHECK BLOOD SUGARS TWICE A DAY 08/05/19   Marrian Salvage, FNP  amLODipine (NORVASC) 5 MG tablet Take 1 tablet (5 mg total) by mouth daily. 03/21/19    Marrian Salvage, FNP  atorvastatin (LIPITOR) 20 MG tablet Take 1 tablet (20 mg total) by mouth daily. 06/23/19   Kuppelweiser, Cassie L, RPH-CPP  Blood Glucose Monitoring Suppl (ACCU-CHEK AVIVA PLUS) w/Device KIT Use to check blood sugars twice a day 05/12/17   Lance Sell, NP  Blood Glucose Monitoring Suppl (ONE TOUCH ULTRA MINI) W/DEVICE KIT 1 Device by Does not apply route once. 09/29/14   Golden Circle, FNP  dapagliflozin propanediol (FARXIGA) 10 MG TABS tablet TAKE 1 TABLET BY MOUTH EVERY DAY 03/21/19   Marrian Salvage, FNP  glipiZIDE (GLUCOTROL XL) 5 MG 24 hr tablet Take 1 tablet (5 mg total) by mouth daily with breakfast. 03/21/19   Marrian Salvage, FNP  hydrocortisone 2.5 % cream Apply topically 2 (two) times daily. 12/10/16   Golden Circle, FNP  Lancets (ACCU-CHEK MULTICLIX) lancets Use to help check blood sugars twice a day 05/12/17   Lance Sell, NP  lisinopril (ZESTRIL) 10 MG tablet Take 1 tablet (10 mg total) by mouth daily. 03/21/19   Marrian Salvage, FNP  omeprazole (PRILOSEC) 10 MG capsule TAKE 1 CAPSULE BY MOUTH EVERY DAY 06/13/19   Marrian Salvage, FNP  Sofosbuvir-Velpatasvir (EPCLUSA) 400-100 MG TABS Take 1 tablet by mouth daily. 06/15/19   Kuppelweiser, Cassie L, RPH-CPP  tamsulosin (FLOMAX) 0.4 MG CAPS capsule Take 1 capsule (0.4 mg total) by mouth daily. 09/04/19   Rozanna Box,  Marieliz Strang A, NP  triamcinolone cream (KENALOG) 0.1 % Apply 1 application topically 2 (two) times daily. 06/21/18   Lance Sell, NP    Family History Family History  Problem Relation Age of Onset  . Breast cancer Mother   . Heart disease Brother   . Lung cancer Father   . Healthy Paternal Grandmother   . Cancer Brother   . Colon cancer Neg Hx   . Esophageal cancer Neg Hx   . Stomach cancer Neg Hx   . Rectal cancer Neg Hx     Social History Social History   Tobacco Use  . Smoking status: Light Tobacco Smoker    Packs/day: 0.25  . Smokeless  tobacco: Never Used  . Tobacco comment: Actively in the process of quitting  Substance Use Topics  . Alcohol use: Yes    Comment: occasionally on weekend  . Drug use: No     Allergies   Metformin and related and Jardiance [empagliflozin]   Review of Systems Review of Systems   Physical Exam Triage Vital Signs ED Triage Vitals  Enc Vitals Group     BP 09/04/19 1134 (!) 159/88     Pulse Rate 09/04/19 1134 94     Resp 09/04/19 1134 18     Temp 09/04/19 1134 98.7 F (37.1 C)     Temp Source 09/04/19 1134 Oral     SpO2 09/04/19 1134 96 %     Weight --      Height --      Head Circumference --      Peak Flow --      Pain Score 09/04/19 1133 9     Pain Loc --      Pain Edu? --      Excl. in [place]? --    No data found.  Updated Vital Signs BP (!) 159/88 (BP Location: Right Arm)   Pulse 94   Temp 98.7 F (37.1 C) (Oral)   Resp 18   SpO2 96%   Visual Acuity Right Eye Distance:   Left Eye Distance:   Bilateral Distance:    Right Eye Near:   Left Eye Near:    Bilateral Near:     Physical Exam Vitals and nursing note reviewed.  Constitutional:      Appearance: Normal appearance.  HENT:     Head: Normocephalic and atraumatic.     Nose: Nose normal.  Eyes:     Conjunctiva/sclera: Conjunctivae normal.  Pulmonary:     Effort: Pulmonary effort is normal.  Abdominal:     General: Abdomen is flat. There is distension.     Tenderness: There is no abdominal tenderness.       Comments: TTP with bladder distention.   Musculoskeletal:        General: Normal range of motion.     Cervical back: Normal range of motion.  Skin:    General: Skin is warm and dry.  Neurological:     Mental Status: He is alert.  Psychiatric:        Mood and Affect: Mood normal.      UC Treatments / Results  Labs (all labs ordered are listed, but only abnormal results are displayed) Labs Reviewed  POCT URINALYSIS DIP (DEVICE) - Abnormal; Notable for the following components:       Result Value   Glucose, UA 500 (*)    Ketones, ur 40 (*)    Hgb urine dipstick TRACE (*)    Protein, ur  30 (*)    All other components within normal limits    EKG   Radiology No results found.  Procedures Procedures (including critical care time)  Medications Ordered in UC Medications - No data to display  Initial Impression / Assessment and Plan / UC Course  I have reviewed the triage vital signs and the nursing notes.  Pertinent labs & imaging results that were available during my care of the patient were reviewed by me and considered in my medical decision making (see chart for details).     Urinary retention Most likely related to BPH. Will place Foley here to drain bladder  Urine with trace hemoglobin but not concerned for infection at this time. Foley placed and patient feeling much better after this.  1 L of urine drained from bladder Will leave Foley in place and apply a leg bag so he can take this home. We will have him contact urology this week for follow-up and recommended if he is unable to get in with urology he can come back here and we can try to take the catheter out. Starting him on Flomax Follow up as needed for continued or worsening symptoms  Final Clinical Impressions(s) / UC Diagnoses   Final diagnoses:  Urinary retention     Discharge Instructions     We placed a Foley catheter here today to drain your bladder.  You will need  to leave this in until you follow-up with urology.  If you are unable to get in with them this week you can come back here and we can remove the catheter and trial to see if you are able to urinate regularly.  Make sure you are drinking plenty of water. Starting you on Flomax.  Take this medication as prescribed.    ED Prescriptions    Medication Sig Dispense Auth. Provider   tamsulosin (FLOMAX) 0.4 MG CAPS capsule Take 1 capsule (0.4 mg total) by mouth daily. 30 capsule Shadara Lopez A, NP     PDMP not reviewed this  encounter.   Orvan July, NP 09/04/19 1541

## 2019-09-11 ENCOUNTER — Other Ambulatory Visit: Payer: Self-pay | Admitting: Family

## 2019-09-30 ENCOUNTER — Other Ambulatory Visit: Payer: Self-pay | Admitting: Family

## 2019-09-30 ENCOUNTER — Telehealth: Payer: Self-pay | Admitting: Family

## 2019-09-30 NOTE — Telephone Encounter (Signed)
We received a request for refill on his Omeprazole; however, he needs to check with his ID provider as this drug could interfere with his medication for Hep C. He is also due for a diabetes follow-up.

## 2019-09-30 NOTE — Telephone Encounter (Signed)
Notified pt w/Laura response. He states he take the medication as needed, but  Have appt next ,omth w/ID will ask him if he can take. Pt states he will have to call back to make appt w/Laura.Marland KitchenJohny Chess

## 2019-10-03 ENCOUNTER — Other Ambulatory Visit: Payer: Self-pay | Admitting: Family

## 2019-10-03 MED ORDER — DAPAGLIFLOZIN PROPANEDIOL 10 MG PO TABS: ORAL_TABLET | ORAL | 0 refills | Status: DC

## 2019-11-02 ENCOUNTER — Ambulatory Visit: Payer: BC Managed Care – PPO | Admitting: Pharmacist

## 2019-11-09 ENCOUNTER — Ambulatory Visit (INDEPENDENT_AMBULATORY_CARE_PROVIDER_SITE_OTHER): Payer: BC Managed Care – PPO | Admitting: Pharmacist

## 2019-11-09 ENCOUNTER — Other Ambulatory Visit: Payer: Self-pay

## 2019-11-09 DIAGNOSIS — B182 Chronic viral hepatitis C: Secondary | ICD-10-CM

## 2019-11-09 NOTE — Progress Notes (Signed)
HPI: Nathaniel Waters is a 64 y.o. male who presents to the [facility] for Hepatitis C follow-up.  Medication: Epclusa x 12 wks  Start Date: 06/27/19  Hepatitis C Genotype: 1a  Fibrosis Score: F3  Hepatitis C RNA: 7.8 million on 03/21/19; <15 on 08/01/19  Patient Active Problem List   Diagnosis Date Noted  . Chronic hepatitis C without hepatic coma ([place]) 04/11/2019  . Hypertension 04/09/2017  . Cigarette nicotine dependence with nicotine-induced disorder 04/09/2017  . Erectile dysfunction associated with type 2 diabetes mellitus ([place]) 10/23/2014  . Dyslipidemia 09/22/2014  . Elevated PSA 09/22/2014  . Routine general medical examination at a health care facility 09/21/2014  . Encounter for general adult medical examination with abnormal findings 09/21/2014  . GERD 12/30/2007  . Type II diabetes mellitus, uncontrolled ([place]) 12/29/2007  . TUBULOVILLOUS ADENOMA, COLON, HX OF 12/29/2007    Patient's Medications  New Prescriptions   No medications on file  Previous Medications   ACCU-CHEK AVIVA PLUS TEST STRIP    USE TO CHECK BLOOD SUGARS TWICE A DAY   AMLODIPINE (NORVASC) 5 MG TABLET    Take 1 tablet (5 mg total) by mouth daily.   ATORVASTATIN (LIPITOR) 20 MG TABLET    Take 1 tablet (20 mg total) by mouth daily.   BLOOD GLUCOSE MONITORING SUPPL (ACCU-CHEK AVIVA PLUS) W/DEVICE KIT    Use to check blood sugars twice a day   BLOOD GLUCOSE MONITORING SUPPL (ONE TOUCH ULTRA MINI) W/DEVICE KIT    1 Device by Does not apply route once.   DAPAGLIFLOZIN PROPANEDIOL (FARXIGA) 10 MG TABS TABLET    TAKE 1 TABLET BY MOUTH EVERY DAY   GLIPIZIDE (GLUCOTROL XL) 5 MG 24 HR TABLET    TAKE 1 TABLET BY MOUTH EVERY DAY WITH BREAKFAST   HYDROCORTISONE 2.5 % CREAM    Apply topically 2 (two) times daily.   LANCETS (ACCU-CHEK MULTICLIX) LANCETS    Use to help check blood sugars twice a day   LISINOPRIL (ZESTRIL) 10 MG TABLET    Take 1 tablet (10 mg total) by mouth daily.   OMEPRAZOLE  (PRILOSEC) 10 MG CAPSULE    TAKE 1 CAPSULE BY MOUTH EVERY DAY   SOFOSBUVIR-VELPATASVIR (EPCLUSA) 400-100 MG TABS    Take 1 tablet by mouth daily.   TAMSULOSIN (FLOMAX) 0.4 MG CAPS CAPSULE    Take 1 capsule (0.4 mg total) by mouth daily.   TRIAMCINOLONE CREAM (KENALOG) 0.1 %    Apply 1 application topically 2 (two) times daily.  Modified Medications   No medications on file  Discontinued Medications   No medications on file    Allergies: Allergies  Allergen Reactions  . Metformin And Related Diarrhea  . Jardiance [Empagliflozin] Rash    Past Medical History: Past Medical History:  Diagnosis Date  . Diabetes mellitus   . Diabetes mellitus without complication ([place])   . GERD (gastroesophageal reflux disease)   . Hypertension     Social History: Social History   Socioeconomic History  . Marital status: Legally Separated    Spouse name: Not on file  . Number of children: 4  . Years of education: 63  . Highest education level: Not on file  Occupational History  . Occupation: Loading [employer]  Tobacco Use  . Smoking status: Light Tobacco Smoker    Packs/day: 0.25  . Smokeless tobacco: Never Used  . Tobacco comment: Actively in the process of quitting  Vaping Use  . Vaping Use: Never used  Substance  and Sexual Activity  . Alcohol use: Yes    Comment: occasionally on weekend  . Drug use: No  . Sexual activity: Not on file  Other Topics Concern  . Not on file  Social History Narrative   ** Merged History Encounter **       Fun: Listen to music and be around family and friends. Denies religious beliefs effecting health care.    Social Determinants of Health   Financial Resource Strain:   . Difficulty of Paying Living Expenses:   Food Insecurity:   . Worried About Charity fundraiser in the Last Year:   . Arboriculturist in the Last Year:   Transportation Needs:   . Film/video editor (Medical):   Marland Kitchen Lack of Transportation (Non-Medical):   Physical Activity:   .  Days of Exercise per Week:   . Minutes of Exercise per Session:   Stress:   . Feeling of Stress :   Social Connections:   . Frequency of Communication with Friends and Family:   . Frequency of Social Gatherings with Friends and Family:   . Attends Religious Services:   . Active Member of Clubs or Organizations:   . Attends Archivist Meetings:   Marland Kitchen Marital Status:     Labs: Hepatitis C Lab Results  Component Value Date   HCVGENOTYPE 1a 04/11/2019   HEPCAB REACTIVE (A) 03/21/2019   HCVRNAPCRQN <15 NOT DETECTED 08/01/2019   HCVRNAPCRQN 7,830,000 (H) 03/21/2019   HCVRNAPCRQN 4,970,000 (H) 02/18/2018   FIBROSTAGE F3 04/11/2019   Hepatitis B Lab Results  Component Value Date   HEPBSAB NON-REACTIVE 04/11/2019   HEPBSAG NON-REACTIVE 04/11/2019   Hepatitis A No results found for: HAV HIV Lab Results  Component Value Date   HIV NON-REACTIVE 02/18/2018   HIV NONREACTIVE 09/21/2014   Lab Results  Component Value Date   CREATININE 1.01 03/21/2019   CREATININE 1.00 02/21/2018   CREATININE 1.40 (H) 02/20/2018   CREATININE 1.28 (H) 02/20/2018   CREATININE 0.97 02/18/2018   Lab Results  Component Value Date   AST 27 03/21/2019   AST 51 (H) 02/20/2018   AST 29 02/18/2018   ALT 39 04/11/2019   ALT 33 03/21/2019   ALT 43 02/20/2018   INR 0.95 02/20/2018    Assessment: Nathaniel Waters is here today to follow up for his chronic Hepatitis C infection. He completed 12 weeks of Epclusa back at the end of June. He states that he is glad not to be taking it anymore but that he did not miss any doses and had no side effects. His early on treatment viral load was already undetectable. Will check again today.  He was off with his follow ups but it isn't quite 3 months past treatment yet, so will have him see [name] in 2 months for his cure visit since he is F3.  Plan: - Hep C viral load today  - F/u with [name] in October  Marco Raper L. Eber Hong, PharmD, BCIDP, AAHIVP, CPP Clinical  Pharmacist Practitioner Infectious Diseases [facility] 11/09/2019, 11:29 AM

## 2019-11-14 LAB — HEPATITIS C RNA QUANTITATIVE
HCV RNA, PCR, QN (Log): <1.18 log IU/mL
HCV RNA, PCR, QN: <15 IU/mL

## 2019-12-05 ENCOUNTER — Other Ambulatory Visit: Payer: Self-pay | Admitting: Family

## 2019-12-05 MED ORDER — GLIPIZIDE ER 5 MG PO TB24: ORAL_TABLET | ORAL | 0 refills | Status: DC

## 2019-12-07 ENCOUNTER — Other Ambulatory Visit: Payer: Self-pay | Admitting: Family

## 2020-01-09 ENCOUNTER — Telehealth: Payer: Self-pay

## 2020-01-09 NOTE — Telephone Encounter (Signed)
COVID-19 Pre-Screening Questions:01/09/20  Do you currently have a fever (>100 F), chills or unexplained body aches?NO  Are you currently experiencing new cough, shortness of breath, sore throat, runny nose? NO   Have you been in contact with someone that is currently pending confirmation of Covid19 testing or has been confirmed to have the [place] virus?  NO  **If the patient answers NO to ALL questions -  advise the patient to please call the clinic before coming to the office should any symptoms develop.

## 2020-01-10 ENCOUNTER — Other Ambulatory Visit: Payer: Self-pay

## 2020-01-10 ENCOUNTER — Encounter: Payer: Self-pay | Admitting: Family

## 2020-01-10 ENCOUNTER — Ambulatory Visit (INDEPENDENT_AMBULATORY_CARE_PROVIDER_SITE_OTHER): Payer: BC Managed Care – PPO | Admitting: Family

## 2020-01-10 VITALS — HR 84 | Wt 219.0 lb

## 2020-01-10 DIAGNOSIS — B182 Chronic viral hepatitis C: Secondary | ICD-10-CM

## 2020-01-10 NOTE — Patient Instructions (Signed)
Nice to see you.  We will check your blood work today.  If your viral load is undetectable then you are cured from hepatitis C.  If you have exposure in the future need to be rescreened for hepatitis C the hepatitis C RNA level/viral load will need to be checked as the hepatitis C antibody is always positive.  Recommend continued routine screenings for hepatocellular carcinoma/liver cancer every 6 months with abdominal ultrasound with or without blood work (alpha-feroprotein).  This can be accomplished by primary care.  Continue to follow-up with primary care for your carpal tunnel  No additional follow-up with infectious disease necessary pending blood work results if negative  Have a great day and stay safe!

## 2020-01-10 NOTE — Progress Notes (Signed)
Subjective:    Patient ID: Nathaniel Waters, male    DOB: Jun 26, 1955, 64 y.o.   MRN: [id number]  Chief Complaint  Patient presents with  . Follow-up    Patient is curious to know results     HPI:  Nathaniel Waters is a 63 y.o. male with Hepatitis C who was last seen in the office on 11/09/19 for routine follow up with Magda Kiel, CPP, PharmD with viral load at the time being undetectable close to the end of his treatment with Epclusa for 12 weeks. Here today for cure visit.   Nathaniel Waters has been doing well since his last office visit. Having some problems with feelings of carpal tunnel syndrome otherwise doing well today. No current symptoms including abdominal pain, nausea, vomiting, diarrhea, scleral icterus, or jaundice.  Allergies  Allergen Reactions  . Metformin And Related Diarrhea  . Jardiance [Empagliflozin] Rash      Outpatient Medications Prior to Visit  Medication Sig Dispense Refill  . ACCU-CHEK AVIVA PLUS test strip USE TO CHECK BLOOD SUGARS TWICE A DAY 100 strip 3  . amLODipine (NORVASC) 5 MG tablet Take 1 tablet (5 mg total) by mouth daily. 90 tablet 3  . atorvastatin (LIPITOR) 20 MG tablet Take 1 tablet (20 mg total) by mouth daily. 30 tablet 2  . Blood Glucose Monitoring Suppl (ACCU-CHEK AVIVA PLUS) w/Device KIT Use to check blood sugars twice a day 1 kit 0  . Blood Glucose Monitoring Suppl (ONE TOUCH ULTRA MINI) W/DEVICE KIT 1 Device by Does not apply route once. 1 each 0  . FARXIGA 10 MG TABS tablet TAKE 1 TABLET BY MOUTH EVERY DAY 90 tablet 0  . glipiZIDE (GLUCOTROL XL) 5 MG 24 hr tablet TAKE 1 TABLET BY MOUTH EVERY DAY WITH BREAKFAST 90 tablet 0  . Lancets (ACCU-CHEK MULTICLIX) lancets Use to help check blood sugars twice a day 102 each 5  . lisinopril (ZESTRIL) 10 MG tablet Take 1 tablet (10 mg total) by mouth daily. 90 tablet 3  . tamsulosin (FLOMAX) 0.4 MG CAPS capsule Take 1 capsule (0.4 mg total) by mouth daily. 30 capsule 0  . hydrocortisone 2.5 % cream  Apply topically 2 (two) times daily. (Patient not taking: Reported on 01/10/2020) 30 g 0  . omeprazole (PRILOSEC) 10 MG capsule TAKE 1 CAPSULE BY MOUTH EVERY DAY (Patient not taking: Reported on 01/10/2020) 90 capsule 0  . triamcinolone cream (KENALOG) 0.1 % Apply 1 application topically 2 (two) times daily. (Patient not taking: Reported on 01/10/2020) 30 g 0  . Sofosbuvir-Velpatasvir (EPCLUSA) 400-100 MG TABS Take 1 tablet by mouth daily. (Patient not taking: Reported on 01/10/2020) 28 tablet 2   No facility-administered medications prior to visit.     Past Medical History:  Diagnosis Date  . Diabetes mellitus   . Diabetes mellitus without complication ([place])   . GERD (gastroesophageal reflux disease)   . Hypertension      Past Surgical History:  Procedure Laterality Date  . CARPAL TUNNEL RELEASE     both   . COLONOSCOPY         Review of Systems  Constitutional: Negative for chills, diaphoresis, fatigue and fever.  Respiratory: Negative for cough, chest tightness, shortness of breath and wheezing.   Cardiovascular: Negative for chest pain.  Gastrointestinal: Negative for abdominal distention, abdominal pain, constipation, diarrhea, nausea and vomiting.  Neurological: Negative for weakness and headaches.  Hematological: Does not bruise/bleed easily.      Objective:    Pulse 84  Wt 219 lb (99.3 kg)   SpO2 97%   BMI 30.54 kg/m  Nursing note and vital signs reviewed.  Physical Exam Constitutional:      General: He is not in acute distress.    Appearance: He is well-developed.  Cardiovascular:     Rate and Rhythm: Normal rate and regular rhythm.     Heart sounds: Normal heart sounds. No murmur heard.  No friction rub. No gallop.   Pulmonary:     Effort: Pulmonary effort is normal. No respiratory distress.     Breath sounds: Normal breath sounds. No wheezing or rales.  Chest:     Chest wall: No tenderness.  Abdominal:     General: Bowel sounds are normal. There is  no distension.     Palpations: Abdomen is soft. There is no mass.     Tenderness: There is no abdominal tenderness. There is no guarding or rebound.  Skin:    General: Skin is warm and dry.  Neurological:     Mental Status: He is alert and oriented to person, place, and time.  Psychiatric:        Behavior: Behavior normal.        Thought Content: Thought content normal.        Judgment: Judgment normal.      Depression screen [facility] 2/9 01/10/2020 04/11/2019 03/21/2019 02/18/2018 12/18/2016  Decreased Interest 0 0 0 0 0  Down, Depressed, Hopeless 0 0 0 0 0  PHQ - 2 Score 0 0 0 0 0       Assessment & Plan:    Patient Active Problem List   Diagnosis Date Noted  . Chronic hepatitis C without hepatic coma ([place]) 04/11/2019  . Hypertension 04/09/2017  . Cigarette nicotine dependence with nicotine-induced disorder 04/09/2017  . Erectile dysfunction associated with type 2 diabetes mellitus ([place]) 10/23/2014  . Dyslipidemia 09/22/2014  . Elevated PSA 09/22/2014  . Routine general medical examination at a health care facility 09/21/2014  . Encounter for general adult medical examination with abnormal findings 09/21/2014  . GERD 12/30/2007  . Type II diabetes mellitus, uncontrolled ([place]) 12/29/2007  . TUBULOVILLOUS ADENOMA, COLON, HX OF 12/29/2007     Problem List Items Addressed This Visit      Digestive   Chronic hepatitis C without hepatic coma ([place]) - Primary    Nathaniel Waters completed 12 weeks of Epclusa for Genotype 1a Chronic Hepatitis C with initial viral load of 7.8 million and fibrosis score of F3.  Check hepatitis C RNA level for sustained viremic response.  Given fibrosis score of F3 there is concern for moderate level of fibrosis with recommendations to follow guidelines for routine screening of hepatocellular carcinoma every 6 months with abdominal ultrasound with or without alpha-fetoprotein screening.  If screening needs to be completed in the future will need to complete  hepatitis C RNA level as hepatitis C antibody will always remain positive.  Additional screenings can be completed with primary care.  Follow-up with ID as needed pending blood work.      Relevant Orders   Hepatitis C RNA quantitative   Comp Met (CMET)       I have discontinued [employer]. I am also having him maintain his ONE TOUCH ULTRA MINI, hydrocortisone, Accu-Chek Aviva Plus, accu-chek multiclix, triamcinolone cream, lisinopril, amLODipine, omeprazole, atorvastatin, Accu-Chek Aviva Plus, tamsulosin, glipiZIDE, and Farxiga.    Follow-up: Return if symptoms worsen or fail to improve.   Terri Piedra, MSN, FNP-C Nurse Practitioner [facility]  for Infectious Disease [place] number: [phone]

## 2020-01-10 NOTE — Assessment & Plan Note (Signed)
Nathaniel Waters completed 12 weeks of Epclusa for Genotype 1a Chronic Hepatitis C with initial viral load of 7.8 million and fibrosis score of F3.  Check hepatitis C RNA level for sustained viremic response.  Given fibrosis score of F3 there is concern for moderate level of fibrosis with recommendations to follow guidelines for routine screening of hepatocellular carcinoma every 6 months with abdominal ultrasound with or without alpha-fetoprotein screening.  If screening needs to be completed in the future will need to complete hepatitis C RNA level as hepatitis C antibody will always remain positive.  Additional screenings can be completed with primary care.  Follow-up with ID as needed pending blood work.

## 2020-01-12 LAB — COMPREHENSIVE METABOLIC PANEL
AG Ratio: 1.5 (calc) (ref 1.0–2.5)
ALT: 13 U/L (ref 9–46)
AST: 18 U/L (ref 10–35)
Albumin: 4.5 g/dL (ref 3.6–5.1)
Alkaline phosphatase (APISO): 74 U/L (ref 35–144)
BUN: 19 mg/dL (ref 7–25)
CO2: 27 mmol/L (ref 20–32)
Calcium: 9.8 mg/dL (ref 8.6–10.3)
Chloride: 103 mmol/L (ref 98–110)
Creat: 1.06 mg/dL (ref 0.70–1.25)
Globulin: 3 g/dL (calc) (ref 1.9–3.7)
Glucose, Bld: 88 mg/dL (ref 65–99)
Potassium: 4.4 mmol/L (ref 3.5–5.3)
Sodium: 138 mmol/L (ref 135–146)
Total Bilirubin: 0.4 mg/dL (ref 0.2–1.2)
Total Protein: 7.5 g/dL (ref 6.1–8.1)

## 2020-01-12 LAB — HEPATITIS C RNA QUANTITATIVE
HCV RNA, PCR, QN (Log): <1.18 log IU/mL
HCV RNA, PCR, QN: <15 IU/mL

## 2020-01-16 ENCOUNTER — Telehealth: Payer: Self-pay

## 2020-01-16 NOTE — Telephone Encounter (Signed)
Called the patient to relay that per Terri Piedra, NP the patient's Hepatitis C RNA level is undetectable, indicating cure. Instructed patient to continue to follow up with primary care for routine liver cancer screenings every 6 months. Patient verbalized understanding and has no further questions.   Beryle Flock, RN

## 2020-01-16 NOTE — Telephone Encounter (Signed)
-----   Message from [place], [place] sent at 01/16/2020  8:45 AM EDT ----- Please inform Nathaniel Waters that his Hepatitis C RNA level is undetectable meaning he is cured from Hepatitis C. Recommend continued follow up with primary care for routine screening for liver cancer every 6 months. No follow ups needed.

## 2020-02-08 ENCOUNTER — Encounter: Payer: Self-pay | Admitting: Family

## 2020-02-08 ENCOUNTER — Other Ambulatory Visit: Payer: Self-pay | Admitting: Family

## 2020-02-08 ENCOUNTER — Other Ambulatory Visit: Payer: Self-pay

## 2020-02-08 ENCOUNTER — Ambulatory Visit: Payer: BC Managed Care – PPO | Admitting: Family

## 2020-02-08 VITALS — BP 152/76 | HR 99 | Temp 98.2°F | Ht 71.0 in | Wt 219.2 lb

## 2020-02-08 DIAGNOSIS — M545 Low back pain, unspecified: Secondary | ICD-10-CM | POA: Diagnosis not present

## 2020-02-08 DIAGNOSIS — R350 Frequency of micturition: Secondary | ICD-10-CM | POA: Diagnosis not present

## 2020-02-08 DIAGNOSIS — E119 Type 2 diabetes mellitus without complications: Secondary | ICD-10-CM

## 2020-02-08 DIAGNOSIS — I10 Essential (primary) hypertension: Secondary | ICD-10-CM

## 2020-02-08 DIAGNOSIS — E785 Hyperlipidemia, unspecified: Secondary | ICD-10-CM

## 2020-02-08 DIAGNOSIS — R300 Dysuria: Secondary | ICD-10-CM | POA: Diagnosis not present

## 2020-02-08 LAB — POCT URINALYSIS DIPSTICK
Bilirubin, UA: NEGATIVE
Blood, UA: NEGATIVE
Glucose, UA: POSITIVE — AB
Ketones, UA: 2
Leukocytes, UA: NEGATIVE
Nitrite, UA: NEGATIVE
Protein, UA: POSITIVE — AB
Spec Grav, UA: 1.02 (ref 1.010–1.025)
Urobilinogen, UA: 0.2 E.U./dL
pH, UA: 6 (ref 5.0–8.0)

## 2020-02-08 LAB — POCT GLYCOSYLATED HEMOGLOBIN (HGB A1C): Hemoglobin A1C: 6.2 % — AB (ref 4.0–5.6)

## 2020-02-08 LAB — GLUCOSE, POCT (MANUAL RESULT ENTRY): POC Glucose: 138 mg/dl — AB (ref 70–99)

## 2020-02-08 MED ORDER — LISINOPRIL 10 MG PO TABS: 10.0000 mg | ORAL_TABLET | Freq: Every day | ORAL | 1 refills | Status: DC

## 2020-02-08 MED ORDER — OMEPRAZOLE 10 MG PO CPDR: DELAYED_RELEASE_CAPSULE | ORAL | 3 refills | Status: DC

## 2020-02-08 MED ORDER — CEFTRIAXONE SODIUM 1 G IJ SOLR: 1.0000 g | Freq: Once | INTRAMUSCULAR | Status: DC

## 2020-02-08 MED ORDER — GLIPIZIDE ER 5 MG PO TB24: ORAL_TABLET | ORAL | 3 refills | Status: DC

## 2020-02-08 MED ORDER — AMLODIPINE BESYLATE 5 MG PO TABS: 5.0000 mg | ORAL_TABLET | Freq: Every day | ORAL | 1 refills | Status: DC

## 2020-02-08 MED ORDER — DAPAGLIFLOZIN PROPANEDIOL 10 MG PO TABS: 10.0000 mg | ORAL_TABLET | Freq: Every day | ORAL | 3 refills | Status: DC

## 2020-02-08 MED ORDER — CEFTRIAXONE SODIUM 250 MG IJ SOLR
500.0000 mg | Freq: Once | INTRAMUSCULAR | Status: AC
  Administered 2020-02-08: 500 mg via INTRAMUSCULAR

## 2020-02-08 MED ORDER — ATORVASTATIN CALCIUM 20 MG PO TABS: 20.0000 mg | ORAL_TABLET | Freq: Every day | ORAL | 3 refills | Status: DC

## 2020-02-08 NOTE — Addendum Note (Signed)
Addended by: Shirlyn Goltz on: 02/08/2020 03:11 PM   Modules accepted: Orders

## 2020-02-08 NOTE — Patient Instructions (Signed)
Please see your urologist today at 1:30 pm!!
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
## 2020-03-13 ENCOUNTER — Other Ambulatory Visit: Payer: Self-pay | Admitting: Family

## 2020-03-22 ENCOUNTER — Other Ambulatory Visit: Payer: Self-pay | Admitting: Family

## 2020-03-22 DIAGNOSIS — E785 Hyperlipidemia, unspecified: Secondary | ICD-10-CM

## 2020-04-14 ENCOUNTER — Other Ambulatory Visit: Payer: Self-pay | Admitting: Family

## 2020-04-14 DIAGNOSIS — E785 Hyperlipidemia, unspecified: Secondary | ICD-10-CM

## 2020-04-23 ENCOUNTER — Telehealth: Payer: Self-pay
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
## 2020-04-24 ENCOUNTER — Other Ambulatory Visit: Payer: Self-pay | Admitting: Family

## 2020-04-24 MED ORDER — BLOOD GLUCOSE MONITOR KIT: PACK | 0 refills | Status: DC
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
## 2020-04-27 ENCOUNTER — Other Ambulatory Visit: Payer: Self-pay | Admitting: Family

## 2020-04-27 MED ORDER — DAPAGLIFLOZIN PROPANEDIOL 10 MG PO TABS: 10.0000 mg | ORAL_TABLET | Freq: Every day | ORAL | 3 refills | Status: DC

## 2020-05-14 ENCOUNTER — Other Ambulatory Visit: Payer: Self-pay | Admitting: Family

## 2020-06-06 ENCOUNTER — Encounter: Payer: BC Managed Care – PPO | Admitting: Internal Medicine

## 2020-06-21 ENCOUNTER — Encounter: Payer: Self-pay | Admitting: Internal Medicine
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
## 2020-08-01 ENCOUNTER — Ambulatory Visit (AMBULATORY_SURGERY_CENTER): Payer: Self-pay

## 2020-08-01 ENCOUNTER — Other Ambulatory Visit: Payer: Self-pay

## 2020-08-01 VITALS — Ht 71.0 in | Wt 225.0 lb

## 2020-08-01 DIAGNOSIS — Z86010 Personal history of colonic polyps: Secondary | ICD-10-CM

## 2020-08-01 MED ORDER — SUTAB 1479-225-188 MG PO TABS: 12.0000 | ORAL_TABLET | ORAL | 0 refills | Status: DC

## 2020-08-01 NOTE — Progress Notes (Signed)
No allergies to soy or egg Pt is not on blood thinners or diet pills Denies issues with sedation/intubation Denies atrial flutter/fib Denies constipation   Emmi instructions given to pt  Pt is aware of Covid safety and care partner requirements.  

## 2020-08-20 ENCOUNTER — Encounter: Payer: Self-pay | Admitting: Internal Medicine

## 2020-08-23 ENCOUNTER — Ambulatory Visit (AMBULATORY_SURGERY_CENTER): Payer: BC Managed Care – PPO | Admitting: Internal Medicine

## 2020-08-23 ENCOUNTER — Encounter: Payer: Self-pay | Admitting: Internal Medicine

## 2020-08-23 ENCOUNTER — Other Ambulatory Visit: Payer: Self-pay

## 2020-08-23 VITALS — BP 119/70 | HR 67 | Temp 98.9°F | Resp 15 | Ht 71.0 in | Wt 225.0 lb

## 2020-08-23 DIAGNOSIS — D124 Benign neoplasm of descending colon: Secondary | ICD-10-CM | POA: Diagnosis not present

## 2020-08-23 DIAGNOSIS — D123 Benign neoplasm of transverse colon: Secondary | ICD-10-CM | POA: Diagnosis not present

## 2020-08-23 DIAGNOSIS — D122 Benign neoplasm of ascending colon: Secondary | ICD-10-CM | POA: Diagnosis not present

## 2020-08-23 DIAGNOSIS — Z86010 Personal history of colonic polyps: Secondary | ICD-10-CM

## 2020-08-23 DIAGNOSIS — D120 Benign neoplasm of cecum: Secondary | ICD-10-CM

## 2020-08-23 MED ORDER — SODIUM CHLORIDE 0.9 % IV SOLN: 500.0000 mL | Freq: Once | INTRAVENOUS | Status: DC

## 2020-08-23 NOTE — Op Note (Signed)
[facility] Patient Name: Nathaniel Waters Every Procedure Date: 08/23/2020 7:25 AM MRN: [id number] Endoscopist: Jerene Bears , MD Age: 65 Referring MD:  Date of Birth: 1955/12/02 Gender: Male Account #: [id number] Procedure:                Colonoscopy Indications:              High risk colon cancer surveillance: Personal                            history of multiple (5 at last exam) adenomas, Last                            colonoscopy: August 2018 Medicines:                Monitored Anesthesia Care Procedure:                Pre-Anesthesia Assessment:                           - Prior to the procedure, a History and Physical                            was performed, and patient medications and                            allergies were reviewed. The patient's tolerance of                            previous anesthesia was also reviewed. The risks                            and benefits of the procedure and the sedation                            options and risks were discussed with the patient.                            All questions were answered, and informed consent                            was obtained. Prior Anticoagulants: The patient has                            taken no previous anticoagulant or antiplatelet                            agents. ASA Grade Assessment: III - A patient with                            severe systemic disease. After reviewing the risks                            and benefits, the patient was deemed in  satisfactory condition to undergo the procedure.                           After obtaining informed consent, the colonoscope                            was passed under direct vision. Throughout the                            procedure, the patient's blood pressure, pulse, and                            oxygen saturations were monitored continuously. The                            [id number] #[id number] was introduced  through the anus                            and advanced to the cecum, identified by the                            ileocecal valve. The colonoscopy was performed                            without difficulty. The patient tolerated the                            procedure well. The quality of the bowel                            preparation was good. The ileocecal valve,                            appendiceal orifice, and rectum were photographed. Scope In: 8:11:13 AM Scope Out: 8:25:10 AM Scope Withdrawal Time: 0 hours 11 minutes 46 seconds  Total Procedure Duration: 0 hours 13 minutes 57 seconds  Findings:                 The digital rectal exam was normal.                           A 3 mm polyp was found in the cecum. The polyp was                            sessile. The polyp was removed with a cold snare.                            Resection and retrieval were complete.                           A 2 mm polyp was found in the ascending colon. The                            polyp was sessile. The  polyp was removed with a                            piecemeal technique using a hot snare. Resection                            and retrieval were complete.                           Two sessile polyps were found in the transverse                            colon. The polyps were 3 to 5 mm in size. These                            polyps were removed with a cold snare. Resection                            and retrieval were complete.                           A 4 mm polyp was found in the descending colon. The                            polyp was sessile. The polyp was removed with a                            cold snare. Resection and retrieval were complete.                           Multiple medium-mouthed diverticula were found in                            the sigmoid colon.                           The retroflexed view of the distal rectum and anal                            verge was  normal and showed no anal or rectal                            abnormalities. Complications:            No immediate complications. Estimated Blood Loss:     Estimated blood loss was minimal. Impression:               - One 3 mm polyp in the cecum, removed with a cold                            snare. Resected and retrieved.                           - One 2 mm polyp in the ascending colon, removed  piecemeal using a hot snare. Resected and retrieved.                           - Two 3 to 5 mm polyps in the transverse colon,                            removed with a cold snare. Resected and retrieved.                           - One 4 mm polyp in the descending colon, removed                            with a cold snare. Resected and retrieved.                           - Diverticulosis in the sigmoid colon.                           - The distal rectum and anal verge are normal on                            retroflexion view. Recommendation:           - Patient has a contact number available for                            emergencies. The signs and symptoms of potential                            delayed complications were discussed with the                            patient. Return to normal activities tomorrow.                            Written discharge instructions were provided to the                            patient.                           - Resume previous diet.                           - Continue present medications.                           - Await pathology results.                           - Repeat colonoscopy is recommended for                            surveillance. The colonoscopy date will be  determined after pathology results from today's                            exam become available for review. Jerene Bears, MD 08/23/2020 8:38:34 AM This report has been signed electronically.

## 2020-08-23 NOTE — Progress Notes (Signed)
Medical history reviewed with no changes noted. VS assessed by J.D 

## 2020-08-23 NOTE — Patient Instructions (Signed)
Handouts given for polyps and diverticulosis.  Await pathology results.  YOU HAD AN ENDOSCOPIC PROCEDURE TODAY AT THE [facility]:   Refer to the procedure report that was given to you for any specific questions about what was found during the examination.  If the procedure report does not answer your questions, please call your gastroenterologist to clarify.  If you requested that your care partner not be given the details of your procedure findings, then the procedure report has been included in a sealed envelope for you to review at your convenience later.  YOU SHOULD EXPECT: Some feelings of bloating in the abdomen. Passage of more gas than usual.  Walking can help get rid of the air that was put into your GI tract during the procedure and reduce the bloating. If you had a lower endoscopy (such as a colonoscopy or flexible sigmoidoscopy) you may notice spotting of blood in your stool or on the toilet paper. If you underwent a bowel prep for your procedure, you may not have a normal bowel movement for a few days.  Please Note:  You might notice some irritation and congestion in your nose or some drainage.  This is from the oxygen used during your procedure.  There is no need for concern and it should clear up in a day or so.  SYMPTOMS TO REPORT IMMEDIATELY:   Following lower endoscopy (colonoscopy or flexible sigmoidoscopy):  Excessive amounts of blood in the stool  Significant tenderness or worsening of abdominal pains  Swelling of the abdomen that is new, acute  Fever of 100F or higher  For urgent or emergent issues, a gastroenterologist can be reached at any hour by calling [phone]. Do not use MyChart messaging for urgent concerns.    DIET:  We do recommend a small meal at first, but then you may proceed to your regular diet.  Drink plenty of fluids but you should avoid alcoholic beverages for 24 hours.  ACTIVITY:  You should plan to take it easy for the rest of  today and you should NOT DRIVE or use heavy machinery until tomorrow (because of the sedation medicines used during the test).    FOLLOW UP: Our staff will call the number listed on your records 48-72 hours following your procedure to check on you and address any questions or concerns that you may have regarding the information given to you following your procedure. If we do not reach you, we will leave a message.  We will attempt to reach you two times.  During this call, we will ask if you have developed any symptoms of COVID 19. If you develop any symptoms (ie: fever, flu-like symptoms, shortness of breath, cough etc.) before then, please call [phone].  If you test positive for Covid 19 in the 2 weeks post procedure, please call and report this information to us.    If any biopsies were taken you will be contacted by phone or by letter within the next 1-3 weeks.  Please call us at [phone] if you have not heard about the biopsies in 3 weeks.    SIGNATURES/CONFIDENTIALITY: You and/or your care partner have signed paperwork which will be entered into your electronic medical record.  These signatures attest to the fact that that the information above on your After Visit Summary has been reviewed and is understood.  Full responsibility of the confidentiality of this discharge information lies with you and/or your care-partner. 

## 2020-08-23 NOTE — Progress Notes (Signed)
To pacu, VSS. Report to [email or web address].tb 

## 2020-08-23 NOTE — Progress Notes (Signed)
Called to room to assist during endoscopic procedure.  Patient ID and intended procedure confirmed with present staff. Received instructions for my participation in the procedure from the performing physician.  

## 2020-08-27 ENCOUNTER — Telehealth: Payer: Self-pay | Admitting: *Deleted

## 2020-08-27 NOTE — Telephone Encounter (Signed)
  Follow up Call-  Call back number 08/23/2020  Post procedure Call Back phone  # [phone]  Permission to leave phone message Yes  Some recent data might be hidden     Patient questions:  Do you have a fever, pain , or abdominal swelling? No. Pain Score  0 *  Have you tolerated food without any problems? Yes.    Have you been able to return to your normal activities? Yes.    Do you have any questions about your discharge instructions: Diet   No. Medications  No. Follow up visit  No.  Do you have questions or concerns about your Care? No.  Actions: * If pain score is 4 or above: No action needed, pain <4.  1. Have you developed a fever since your procedure? no  2.   Have you had an respiratory symptoms (SOB or cough) since your procedure? no  3.   Have you tested positive for COVID 19 since your procedure no  4.   Have you had any family members/close contacts diagnosed with the COVID 19 since your procedure?  no   If yes to any of these questions please route to Joylene John, RN and Joella Prince, RN

## 2020-08-29 ENCOUNTER — Encounter: Payer: Self-pay | Admitting: Internal Medicine

## 2020-09-06 ENCOUNTER — Other Ambulatory Visit: Payer: Self-pay | Admitting: Family

## 2020-09-06 DIAGNOSIS — I10 Essential (primary) hypertension: Secondary | ICD-10-CM

## 2020-11-14 ENCOUNTER — Other Ambulatory Visit: Payer: Self-pay | Admitting: Family

## 2020-11-14 ENCOUNTER — Telehealth: Payer: Self-pay | Admitting: Family

## 2020-11-14 DIAGNOSIS — I10 Essential (primary) hypertension: Secondary | ICD-10-CM

## 2020-11-14 MED ORDER — LISINOPRIL 10 MG PO TABS: 10.0000 mg | ORAL_TABLET | Freq: Every day | ORAL | 0 refills | Status: DC

## 2020-11-14 MED ORDER — AMLODIPINE BESYLATE 5 MG PO TABS: 5.0000 mg | ORAL_TABLET | Freq: Every day | ORAL | 0 refills | Status: DC

## 2020-11-14 NOTE — Telephone Encounter (Signed)
He needs OV for follow up on chronic care needs; Please see if he is going to stay at [facility] or go to [facility]. Last refills updated today.

## 2020-11-14 NOTE — Addendum Note (Signed)
Addended by: Kittie Plater, Sargon Scouten HUA on: 11/14/2020 04:41 PM   Modules accepted: Orders

## 2020-11-14 NOTE — Telephone Encounter (Signed)
I have called pt to relay the message from the provider. There was no answer so I left a message to call back with the message from provider.

## 2020-11-14 NOTE — Telephone Encounter (Signed)
I have called pt to see if he wanted to follow Mickel Baas and come to [facility] or make get a new provider at [facility]. I have called pt a few times and there was no answer.

## 2020-11-14 NOTE — Telephone Encounter (Signed)
I have reached the pt and informed him that I will send in a 90 day curtsy refill for I know that [facility] is booking out for appts. I have given him the phone number and he will call to get scheduled with the new provider at [facility].   Rx for Lisinopril and Amlodipine sent to pharmacy on file.

## 2020-11-14 NOTE — Telephone Encounter (Signed)
Pt needs to call us back and inform us what he wants to do about continuing his care.

## 2021-01-29 ENCOUNTER — Other Ambulatory Visit: Payer: Self-pay | Admitting: Family

## 2021-01-29 DIAGNOSIS — E785 Hyperlipidemia, unspecified: Secondary | ICD-10-CM

## 2021-02-06 ENCOUNTER — Other Ambulatory Visit: Payer: Self-pay | Admitting: Family

## 2021-02-06 DIAGNOSIS — I10 Essential (primary) hypertension: Secondary | ICD-10-CM

## 2021-02-09 ENCOUNTER — Other Ambulatory Visit: Payer: Self-pay | Admitting: Family

## 2021-02-09 DIAGNOSIS — I10 Essential (primary) hypertension: Secondary | ICD-10-CM

## 2021-02-21 ENCOUNTER — Encounter: Payer: BC Managed Care – PPO | Admitting: Internal Medicine

## 2021-03-17 ENCOUNTER — Other Ambulatory Visit: Payer: Self-pay | Admitting: Family

## 2021-04-04 ENCOUNTER — Other Ambulatory Visit: Payer: Self-pay | Admitting: Family

## 2021-04-11 ENCOUNTER — Telehealth: Payer: Self-pay | Admitting: Family

## 2021-04-11 DIAGNOSIS — I10 Essential (primary) hypertension: Secondary | ICD-10-CM

## 2021-04-11 MED ORDER — ACCU-CHEK GUIDE VI STRP: ORAL_STRIP | 0 refills | Status: DC

## 2021-04-11 MED ORDER — AMLODIPINE BESYLATE 5 MG PO TABS: 5.0000 mg | ORAL_TABLET | Freq: Every day | ORAL | 1 refills | Status: DC

## 2021-04-11 NOTE — Telephone Encounter (Signed)
Rx refilled. Pt must keep TOC with Dr.Jones in 05/22/21.

## 2021-04-11 NOTE — Telephone Encounter (Signed)
Medication:  1.test strips for accu check  2.amLODipine (NORVASC) 5 MG tablet  Has the patient contacted their pharmacy? Yes.   (If no, request that the patient contact the pharmacy for the refill.) (If yes, when and what did the pharmacy advise?)  Preferred Pharmacy (with phone number or street name): [id number] - [place], [place] - [place]  [address], [place] [address]  Phone:  [phone]  Fax:  [phone]   Agent: Please be advised that RX refills may take up to 3 business days. We ask that you follow-up with your pharmacy.

## 2021-04-27 ENCOUNTER — Other Ambulatory Visit: Payer: Self-pay | Admitting: Family

## 2021-05-06 ENCOUNTER — Other Ambulatory Visit: Payer: Self-pay | Admitting: Family

## 2021-05-06 DIAGNOSIS — I10 Essential (primary) hypertension: Secondary | ICD-10-CM

## 2021-05-15 ENCOUNTER — Telehealth: Payer: Self-pay | Admitting: Family

## 2021-05-15 DIAGNOSIS — I10 Essential (primary) hypertension: Secondary | ICD-10-CM

## 2021-05-15 NOTE — Telephone Encounter (Signed)
Medication:  lisinopril (ZESTRIL) 10 MG tablet [[id number]]     Has the patient contacted their pharmacy? No. (If no, request that the patient contact the pharmacy for the refill.) (If yes, when and what did the pharmacy advise?)     Preferred Pharmacy (with phone number or street name):  [id number] - [place], [place] - [place]  [address], [place] [address]  Phone:  [phone]  Fax:  [phone]     Agent: Please be advised that RX refills may take up to 3 business days. We ask that you follow-up with your pharmacy.

## 2021-05-16 MED ORDER — LISINOPRIL 10 MG PO TABS
10.0000 mg | ORAL_TABLET | Freq: Every day | ORAL | 0 refills | Status: DC
Start: 2021-05-16 — End: 2021-07-09

## 2021-05-16 NOTE — Telephone Encounter (Signed)
I have called pt and informed him that we have not seen in over a year and he will need to be seen in order to get additional refills. He stated understanding and had to cancel his appt due to being layed off.  He is waiting for his medicare to kick in. I have refill his rx for 1 month and he understands that this is the last refill until he is seen in the [place]. He is willing to come to [place], but has officially made the decision yet.  FYI to provider.  Last curtsy refill given.

## 2021-05-22 ENCOUNTER — Encounter: Payer: BC Managed Care – PPO | Admitting: Internal Medicine

## 2021-05-28 ENCOUNTER — Other Ambulatory Visit: Payer: Self-pay | Admitting: Family

## 2021-06-17 ENCOUNTER — Other Ambulatory Visit: Payer: Self-pay | Admitting: Family

## 2021-07-09 ENCOUNTER — Encounter: Payer: Self-pay | Admitting: Family

## 2021-07-09 ENCOUNTER — Ambulatory Visit (INDEPENDENT_AMBULATORY_CARE_PROVIDER_SITE_OTHER): Payer: Medicare Other | Admitting: Family

## 2021-07-09 VITALS — BP 154/100 | HR 89 | Temp 98.6°F | Ht 71.0 in | Wt 230.6 lb

## 2021-07-09 DIAGNOSIS — E119 Type 2 diabetes mellitus without complications: Secondary | ICD-10-CM | POA: Diagnosis not present

## 2021-07-09 DIAGNOSIS — I10 Essential (primary) hypertension: Secondary | ICD-10-CM | POA: Diagnosis not present

## 2021-07-09 DIAGNOSIS — Z125 Encounter for screening for malignant neoplasm of prostate: Secondary | ICD-10-CM

## 2021-07-09 DIAGNOSIS — E785 Hyperlipidemia, unspecified: Secondary | ICD-10-CM

## 2021-07-09 DIAGNOSIS — R351 Nocturia: Secondary | ICD-10-CM

## 2021-07-09 MED ORDER — DAPAGLIFLOZIN PROPANEDIOL 10 MG PO TABS: 10.0000 mg | ORAL_TABLET | Freq: Every day | ORAL | 0 refills | Status: DC

## 2021-07-09 MED ORDER — GLIPIZIDE ER 5 MG PO TB24: 5.0000 mg | ORAL_TABLET | Freq: Every day | ORAL | 1 refills | Status: DC

## 2021-07-09 MED ORDER — TAMSULOSIN HCL 0.4 MG PO CAPS: 0.4000 mg | ORAL_CAPSULE | Freq: Every day | ORAL | 1 refills | Status: DC

## 2021-07-09 MED ORDER — ATORVASTATIN CALCIUM 20 MG PO TABS: 20.0000 mg | ORAL_TABLET | Freq: Every day | ORAL | 1 refills | Status: DC

## 2021-07-09 MED ORDER — LISINOPRIL 10 MG PO TABS: 10.0000 mg | ORAL_TABLET | Freq: Every day | ORAL | 0 refills | Status: DC

## 2021-07-09 MED ORDER — OMEPRAZOLE 10 MG PO CPDR: DELAYED_RELEASE_CAPSULE | ORAL | 3 refills | Status: DC

## 2021-07-09 MED ORDER — AMLODIPINE BESYLATE 5 MG PO TABS: 5.0000 mg | ORAL_TABLET | Freq: Every day | ORAL | 1 refills | Status: DC

## 2021-07-09 NOTE — Progress Notes (Signed)
?Nathaniel Waters is a 66 y.o. male with the following history as recorded in EpicCare:  ?Patient Active Problem List  ? Diagnosis Date Noted  ? Chronic hepatitis C without hepatic coma ([place]) 04/11/2019  ? Hypertension 04/09/2017  ? Cigarette nicotine dependence with nicotine-induced disorder 04/09/2017  ? Erectile dysfunction associated with type 2 diabetes mellitus ([place]) 10/23/2014  ? Dyslipidemia 09/22/2014  ? Elevated PSA 09/22/2014  ? Routine general medical examination at a health care facility 09/21/2014  ? Encounter for general adult medical examination with abnormal findings 09/21/2014  ? GERD 12/30/2007  ? Type II diabetes mellitus, uncontrolled 12/29/2007  ? TUBULOVILLOUS ADENOMA, COLON, HX OF 12/29/2007  ?  ?Current Outpatient Medications  ?Medication Sig Dispense Refill  ? Blood Glucose Monitoring Suppl (ACCU-CHEK AVIVA PLUS) w/Device KIT Use to check blood sugars twice a day 1 kit 0  ? Lancets (ACCU-CHEK MULTICLIX) lancets Use to help check blood sugars twice a day 102 each 5  ? amLODipine (NORVASC) 5 MG tablet Take 1 tablet (5 mg total) by mouth daily. 90 tablet 1  ? atorvastatin (LIPITOR) 20 MG tablet Take 1 tablet (20 mg total) by mouth daily. 90 tablet 1  ? blood glucose meter kit and supplies KIT Dispense based on patient and insurance preference. Use up to four times daily as directed. (FOR ICD-9 250.00, 250.01). (Patient not taking: Reported on 07/09/2021) 1 each 0  ? dapagliflozin propanediol (FARXIGA) 10 MG TABS tablet Take 1 tablet (10 mg total) by mouth daily. 90 tablet 0  ? glipiZIDE (GLUCOTROL XL) 5 MG 24 hr tablet Take 1 tablet (5 mg total) by mouth daily with breakfast. 90 tablet 1  ? glucose blood (ACCU-CHEK GUIDE) test strip TEST UP TO 4 TIMES A DAY (Patient not taking: Reported on 07/09/2021) 100 strip 0  ? hydrocortisone 2.5 % cream Apply topically 2 (two) times daily. (Patient not taking: Reported on 08/01/2020) 30 g 0  ? lisinopril (ZESTRIL) 10 MG tablet Take 1 tablet (10 mg total) by mouth  daily. 90 tablet 0  ? omeprazole (PRILOSEC) 10 MG capsule TAKE 1 CAPSULE BY MOUTH EVERY DAY 90 capsule 3  ? tamsulosin (FLOMAX) 0.4 MG CAPS capsule Take 1 capsule (0.4 mg total) by mouth daily. 90 capsule 1  ? triamcinolone cream (KENALOG) 0.1 % Apply 1 application topically 2 (two) times daily. (Patient not taking: Reported on 08/01/2020) 30 g 0  ? ?No current facility-administered medications for this visit.  ?  ?Allergies: Metformin and related and Jardiance [empagliflozin]  ?Past Medical History:  ?Diagnosis Date  ? Diabetes mellitus   ? Diabetes mellitus without complication ([facility])   ? GERD (gastroesophageal reflux disease)   ? Hyperlipidemia   ? Hypertension   ?  ?Past Surgical History:  ?Procedure Laterality Date  ? CARPAL TUNNEL RELEASE    ? both   ? COLONOSCOPY  2018  ?  ?Family History  ?Problem Relation Age of Onset  ? Breast cancer Mother   ? Heart disease Brother   ? Lung cancer Father   ? Healthy Paternal Grandmother   ? Cancer Brother   ? Colon cancer Neg Hx   ? Esophageal cancer Neg Hx   ? Stomach cancer Neg Hx   ? Rectal cancer Neg Hx   ? Colon polyps Neg Hx   ?  ?Social History  ? ?Tobacco Use  ? Smoking status: Light Smoker  ?  Packs/day: 0.25  ?  Types: Cigarettes  ? Smokeless tobacco: Never  ? Tobacco comments:  ?  Actively in the process of quitting  ?Substance Use Topics  ? Alcohol use: Yes  ?  Comment: occasionally on weekend  ?  ?Subjective:  ? ?History of hypertension, Type 2 diabetes, hyperlipidemia; ?Patient has not been seen since 2021; unfortunately, he lost his health insurance and was not able to afford care until Medicare in place;  ?Denies any chest pain, shortness of breath, blurred vision or headache ? ? ? ?Objective:  ?Vitals:  ? 07/09/21 1556  ?BP: (!) 154/100  ?Pulse: 89  ?Temp: 98.6 ?F (37 ?C)  ?TempSrc: Oral  ?SpO2: 94%  ?Weight: 230 lb 9.6 oz (104.6 kg)  ?Height: '5\' 11"'  (1.803 m)  ?  ?General: Well developed, well nourished, in no acute distress  ?Skin : Warm and dry.  ?Head:  Normocephalic and atraumatic  ?Eyes: Sclera and conjunctiva clear; pupils round and reactive to light; extraocular movements intact  ?Ears: External normal; canals clear; tympanic membranes normal  ?Oropharynx: Pink, supple. No suspicious lesions  ?Neck: Supple without thyromegaly, adenopathy  ?Lungs: Respirations unlabored; clear to auscultation bilaterally without wheeze, rales, rhonchi  ?CVS exam: normal rate and regular rhythm.  ?Neurologic: Alert and oriented; speech intact; face symmetrical; moves all extremities well; CNII-XII intact without focal deficit  ? ?Assessment:  ?1. Primary hypertension   ?2. Hyperlipidemia, unspecified hyperlipidemia type   ?3. Type 2 diabetes mellitus without complication, without long-term current use of insulin ([place])   ?4. Prostate cancer screening   ?5. Nocturia   ?6. Hypertension, unspecified type   ?  ?Plan:  ?Will update all refills today and update labs; he indicates that he would like to transfer to [facility] location as this is much closer to his home; we will help get him set up for follow up there in 1 month;  ? ?This visit occurred during the SARS-CoV-2 public health emergency.  Safety protocols were in place, including screening questions prior to the visit, additional usage of staff PPE, and extensive cleaning of exam room while observing appropriate contact time as indicated for disinfecting solutions.  ? ? ?No follow-ups on file.  ?Orders Placed This Encounter  ?Procedures  ? CBC with Differential/Platelet  ? Comp Met (CMET)  ? Lipid panel  ? Hemoglobin A1c  ? PSA  ?  ?Requested Prescriptions  ? ?Signed Prescriptions Disp Refills  ? amLODipine (NORVASC) 5 MG tablet 90 tablet 1  ?  Sig: Take 1 tablet (5 mg total) by mouth daily.  ? atorvastatin (LIPITOR) 20 MG tablet 90 tablet 1  ?  Sig: Take 1 tablet (20 mg total) by mouth daily.  ? dapagliflozin propanediol (FARXIGA) 10 MG TABS tablet 90 tablet 0  ?  Sig: Take 1 tablet (10 mg total) by mouth daily.  ? glipiZIDE  (GLUCOTROL XL) 5 MG 24 hr tablet 90 tablet 1  ?  Sig: Take 1 tablet (5 mg total) by mouth daily with breakfast.  ? lisinopril (ZESTRIL) 10 MG tablet 90 tablet 0  ?  Sig: Take 1 tablet (10 mg total) by mouth daily.  ? omeprazole (PRILOSEC) 10 MG capsule 90 capsule 3  ?  Sig: TAKE 1 CAPSULE BY MOUTH EVERY DAY  ? tamsulosin (FLOMAX) 0.4 MG CAPS capsule 90 capsule 1  ?  Sig: Take 1 capsule (0.4 mg total) by mouth daily.  ?  ? ?

## 2021-07-10 LAB — CBC WITH DIFFERENTIAL/PLATELET
Basophils Absolute: 0.1 10*3/uL (ref 0.0–0.1)
Basophils Relative: 0.8 % (ref 0.0–3.0)
Eosinophils Absolute: 0.1 10*3/uL (ref 0.0–0.7)
Eosinophils Relative: 1.2 % (ref 0.0–5.0)
HCT: 48.6 % (ref 39.0–52.0)
Hemoglobin: 16.4 g/dL (ref 13.0–17.0)
Lymphocytes Relative: 32.8 % (ref 12.0–46.0)
Lymphs Abs: 2.6 10*3/uL (ref 0.7–4.0)
MCHC: 33.9 g/dL (ref 30.0–36.0)
MCV: 93.2 fl (ref 78.0–100.0)
Monocytes Absolute: 0.8 10*3/uL (ref 0.1–1.0)
Monocytes Relative: 9.5 % (ref 3.0–12.0)
Neutro Abs: 4.4 10*3/uL (ref 1.4–7.7)
Neutrophils Relative %: 55.7 % (ref 43.0–77.0)
Platelets: 174 10*3/uL (ref 150.0–400.0)
RBC: 5.21 Mil/uL (ref 4.22–5.81)
RDW: 14.2 % (ref 11.5–15.5)
WBC: 8 10*3/uL (ref 4.0–10.5)

## 2021-07-10 LAB — COMPREHENSIVE METABOLIC PANEL
ALT: 14 U/L (ref 0–53)
AST: 17 U/L (ref 0–37)
Albumin: 4.5 g/dL (ref 3.5–5.2)
Alkaline Phosphatase: 69 U/L (ref 39–117)
BUN: 18 mg/dL (ref 6–23)
CO2: 27 mEq/L (ref 19–32)
Calcium: 10.2 mg/dL (ref 8.4–10.5)
Chloride: 102 mEq/L (ref 96–112)
Creatinine, Ser: 1.17 mg/dL (ref 0.40–1.50)
GFR: 65.03 mL/min (ref >60.00)
Glucose, Bld: 113 mg/dL — ABNORMAL HIGH (ref 70–99)
Potassium: 4.2 mEq/L (ref 3.5–5.1)
Sodium: 137 mEq/L (ref 135–145)
Total Bilirubin: 0.3 mg/dL (ref 0.2–1.2)
Total Protein: 7.3 g/dL (ref 6.0–8.3)

## 2021-07-10 LAB — LIPID PANEL
Cholesterol: 286 mg/dL — ABNORMAL HIGH (ref 0–200)
HDL: 40.7 mg/dL (ref >39.00)
Total CHOL/HDL Ratio: 7
Triglycerides: 532 mg/dL — ABNORMAL HIGH (ref 0.0–149.0)

## 2021-07-10 LAB — LDL CHOLESTEROL, DIRECT: Direct LDL: 206 mg/dL

## 2021-07-10 LAB — HEMOGLOBIN A1C: Hgb A1c MFr Bld: 7.5 % — ABNORMAL HIGH (ref 4.6–6.5)

## 2021-07-10 LAB — PSA: PSA: 11.52 ng/mL — ABNORMAL HIGH (ref 0.10–4.00)

## 2021-07-11 MED ORDER — ATORVASTATIN CALCIUM 20 MG PO TABS: 20.0000 mg | ORAL_TABLET | Freq: Every day | ORAL | 1 refills | Status: DC

## 2021-07-11 NOTE — Progress Notes (Signed)
I have called the pt and relayed them message from the provider. He stated that some of his medications is costing too much I have informed him about he could get Amlodipine at [facility].  ? ?Pt stated that Wilder Glade was thousand dollars, I will relay that to the provider to see if there is an alternative.  ?

## 2021-07-11 NOTE — Addendum Note (Signed)
Addended by: Kittie Plater, Ezequiel Macauley HUA on: 07/11/2021 11:36 AM ? ? Modules accepted: Orders ? ?

## 2021-07-23 ENCOUNTER — Ambulatory Visit: Payer: Medicare Other | Admitting: Emergency Medicine

## 2021-08-13 ENCOUNTER — Other Ambulatory Visit: Payer: Self-pay | Admitting: Family

## 2021-08-13 DIAGNOSIS — I10 Essential (primary) hypertension: Secondary | ICD-10-CM

## 2021-09-04 ENCOUNTER — Other Ambulatory Visit: Payer: Self-pay | Admitting: Family

## 2021-09-04 DIAGNOSIS — I10 Essential (primary) hypertension: Secondary | ICD-10-CM

## 2021-09-07 ENCOUNTER — Encounter (HOSPITAL_COMMUNITY): Payer: Self-pay | Admitting: *Deleted

## 2021-09-07 ENCOUNTER — Other Ambulatory Visit: Payer: Self-pay

## 2021-09-07 ENCOUNTER — Emergency Department (HOSPITAL_COMMUNITY)
Admission: EM | Admit: 2021-09-07 | Discharge: 2021-09-07 | Disposition: A | Discharge status: Home / Self Care | Payer: Medicare Other | Attending: Emergency Medicine | Admitting: Emergency Medicine

## 2021-09-07 DIAGNOSIS — R1030 Lower abdominal pain, unspecified: Secondary | ICD-10-CM | POA: Diagnosis not present

## 2021-09-07 DIAGNOSIS — R350 Frequency of micturition: Secondary | ICD-10-CM | POA: Diagnosis not present

## 2021-09-07 DIAGNOSIS — Z79899 Other long term (current) drug therapy: Secondary | ICD-10-CM | POA: Insufficient documentation

## 2021-09-07 DIAGNOSIS — Z7984 Long term (current) use of oral hypoglycemic drugs: Secondary | ICD-10-CM | POA: Insufficient documentation

## 2021-09-07 DIAGNOSIS — R339 Retention of urine, unspecified: Secondary | ICD-10-CM | POA: Insufficient documentation

## 2021-09-07 DIAGNOSIS — E119 Type 2 diabetes mellitus without complications: Secondary | ICD-10-CM | POA: Diagnosis not present

## 2021-09-07 DIAGNOSIS — I10 Essential (primary) hypertension: Secondary | ICD-10-CM | POA: Insufficient documentation

## 2021-09-07 LAB — CBC
HCT: 48.5 % (ref 39.0–52.0)
Hemoglobin: 16.3 g/dL (ref 13.0–17.0)
MCH: 30.9 pg (ref 26.0–34.0)
MCHC: 33.6 g/dL (ref 30.0–36.0)
MCV: 91.9 fL (ref 80.0–100.0)
Platelets: 172 10*3/uL (ref 150–400)
RBC: 5.28 MIL/uL (ref 4.22–5.81)
RDW: 13 % (ref 11.5–15.5)
WBC: 11 10*3/uL — ABNORMAL HIGH (ref 4.0–10.5)
nRBC: 0 % (ref 0.0–0.2)

## 2021-09-07 LAB — URINALYSIS, ROUTINE W REFLEX MICROSCOPIC
Bilirubin Urine: NEGATIVE
Glucose, UA: >=500 mg/dL — AB
Ketones, ur: NEGATIVE mg/dL
Leukocytes,Ua: NEGATIVE
Nitrite: NEGATIVE
Protein, ur: 30 mg/dL — AB
RBC / HPF: >50 RBC/hpf — ABNORMAL HIGH (ref 0–5)
Specific Gravity, Urine: 1.009 (ref 1.005–1.030)
pH: 5 (ref 5.0–8.0)

## 2021-09-07 LAB — BASIC METABOLIC PANEL
Anion gap: 12 (ref 5–15)
BUN: 13 mg/dL (ref 8–23)
CO2: 19 mmol/L — ABNORMAL LOW (ref 22–32)
Calcium: 9.4 mg/dL (ref 8.9–10.3)
Chloride: 105 mmol/L (ref 98–111)
Creatinine, Ser: 1.1 mg/dL (ref 0.61–1.24)
GFR, Estimated: >60 mL/min (ref >60)
Glucose, Bld: 232 mg/dL — ABNORMAL HIGH (ref 70–99)
Potassium: 3.9 mmol/L (ref 3.5–5.1)
Sodium: 136 mmol/L (ref 135–145)

## 2021-09-07 MED ORDER — CEPHALEXIN 500 MG PO CAPS: 500.0000 mg | ORAL_CAPSULE | Freq: Four times a day (QID) | ORAL | 0 refills | Status: DC

## 2021-09-07 NOTE — ED Triage Notes (Signed)
The pt has been unable to void except in small drops  since 2000   he is having severe pain.  Hx of another occurrence approx 2 years agi

## 2021-09-07 NOTE — Discharge Instructions (Addendum)
Return for any problem  Follow-up with urology as discussed on Monday morning.  Your catheter should remain in place until removed by urology.  Take Keflex as prescribed to help avoid urinary infection.  Continue to take flomax as previously prescribed.

## 2021-09-07 NOTE — ED Notes (Signed)
Pt was assisted in getting his foley catheter bag changed into a leg bag for his d/c.

## 2021-09-07 NOTE — ED Provider Notes (Signed)
[facility] EMERGENCY DEPARTMENT Provider Note   CSN: [id number] Arrival date & time: 09/07/21  0609     History  Chief Complaint  Patient presents with   unable to void    Nathaniel Waters is a 66 y.o. male.  66 year old male with prior medical history as detailed below presents for evaluation.  Patient reports last successful urination was around 7 PM last night.  Patient has the urge to urinate now.  Patient reports that he is unable to produce any urine.  He denies any recent fever.  He reports suprapubic pain and discomfort.  He reports similar occurrence of urinary obstruction several years ago.  This required placement of Foley catheter.  He was seen by urology at that time placed on Flomax.  He was not advised that he needed surgical intervention on his prostate.  Per patient, he had an enlarged prostate but with Flomax use urology felt that his symptoms would be controllable.  Patient reports that he still takes Flomax.  The history is provided by the patient and medical records.  Urinary Frequency This is a new problem. The current episode started yesterday. The problem occurs rarely. The problem has not changed since onset.Nothing aggravates the symptoms. Nothing relieves the symptoms.      Home Medications Prior to Admission medications   Medication Sig Start Date End Date Taking? Authorizing Provider  ACCU-CHEK GUIDE test strip TEST UP TO 4 TIMES A DAY 09/04/21   Marrian Salvage, FNP  amLODipine (NORVASC) 5 MG tablet Take 1 tablet (5 mg total) by mouth daily. 07/09/21   Marrian Salvage, FNP  atorvastatin (LIPITOR) 20 MG tablet Take 1 tablet (20 mg total) by mouth daily. 07/11/21   Marrian Salvage, FNP  blood glucose meter kit and supplies KIT Dispense based on patient and insurance preference. Use up to four times daily as directed. (FOR ICD-9 250.00, 250.01). Patient not taking: Reported on 07/09/2021 04/24/20   Marrian Salvage, FNP   Blood Glucose Monitoring Suppl (ACCU-CHEK AVIVA PLUS) w/Device KIT Use to check blood sugars twice a day 05/12/17   Margot Ables, NP  dapagliflozin propanediol (FARXIGA) 10 MG TABS tablet Take 1 tablet (10 mg total) by mouth daily. 07/09/21   Marrian Salvage, FNP  glipiZIDE (GLUCOTROL XL) 5 MG 24 hr tablet Take 1 tablet (5 mg total) by mouth daily with breakfast. 07/09/21   Marrian Salvage, FNP  hydrocortisone 2.5 % cream Apply topically 2 (two) times daily. Patient not taking: Reported on 08/01/2020 12/10/16   Golden Circle, FNP  Lancets (ACCU-CHEK MULTICLIX) lancets Use to help check blood sugars twice a day 05/12/17   Margot Ables, NP  lisinopril (ZESTRIL) 10 MG tablet TAKE 1 TABLET BY MOUTH EVERY DAY 08/13/21   Marrian Salvage, FNP  omeprazole (PRILOSEC) 10 MG capsule TAKE 1 CAPSULE BY MOUTH EVERY DAY 07/09/21   Marrian Salvage, FNP  tamsulosin (FLOMAX) 0.4 MG CAPS capsule Take 1 capsule (0.4 mg total) by mouth daily. 07/09/21   Marrian Salvage, FNP  triamcinolone cream (KENALOG) 0.1 % Apply 1 application topically 2 (two) times daily. Patient not taking: Reported on 08/01/2020 06/21/18   Margot Ables, NP      Allergies    Metformin and related and Jardiance [empagliflozin]    Review of Systems   Review of Systems  Genitourinary:  Positive for frequency.  All other systems reviewed and are negative.  Physical Exam Updated Vital Signs BP Marland Kitchen)  155/98 (BP Location: Left Arm)   Pulse (!) 104   Temp 98.4 F (36.9 C) (Oral)   Resp (!) 24   Ht '5\' 11"'  (1.803 m)   Wt 104.6 kg   SpO2 100%   BMI 32.16 kg/m  Physical Exam Vitals and nursing note reviewed.  Constitutional:      General: He is not in acute distress.    Appearance: Normal appearance. He is well-developed.  HENT:     Head: Normocephalic and atraumatic.  Eyes:     Conjunctiva/sclera: Conjunctivae normal.     Pupils: Pupils are equal, round, and reactive to  light.  Cardiovascular:     Rate and Rhythm: Normal rate and regular rhythm.     Heart sounds: Normal heart sounds.  Pulmonary:     Effort: Pulmonary effort is normal. No respiratory distress.     Breath sounds: Normal breath sounds.  Abdominal:     General: There is no distension.     Palpations: Abdomen is soft.     Tenderness: There is no abdominal tenderness.     Comments: Tenderness to suprapubic area - distended bladder present  Musculoskeletal:        General: No deformity. Normal range of motion.     Cervical back: Normal range of motion and neck supple.  Skin:    General: Skin is warm and dry.  Neurological:     General: No focal deficit present.     Mental Status: He is alert and oriented to person, place, and time. Mental status is at baseline.    ED Results / Procedures / Treatments   Labs (all labs ordered are listed, but only abnormal results are displayed) Labs Reviewed  CBC - Abnormal; Notable for the following components:      Result Value   WBC 11.0 (*)    All other components within normal limits  URINALYSIS, ROUTINE W REFLEX MICROSCOPIC - Abnormal; Notable for the following components:   Glucose, UA >=500 (*)    Hgb urine dipstick MODERATE (*)    Protein, ur 30 (*)    RBC / HPF >50 (*)    Bacteria, UA RARE (*)    All other components within normal limits  BASIC METABOLIC PANEL - Abnormal; Notable for the following components:   CO2 19 (*)    Glucose, Bld 232 (*)    All other components within normal limits    EKG None  Radiology No results found.  Procedures Procedures    Medications Ordered in ED Medications - No data to display  ED Course/ Medical Decision Making/ A&P                           Medical Decision Making Amount and/or Complexity of Data Reviewed Labs: ordered.    Medical Screen Complete  This patient presented to the ED with complaint of urinary retention.  This complaint involves an extensive number of treatment  options. The initial differential diagnosis includes, but is not limited to, prostatic hypertrophy, metabolic abnormality, AKI, etc.  This presentation is: Acute, Self-Limited, Previously Undiagnosed, Uncertain Prognosis, Complicated, Systemic Symptoms, and Threat to Life/Bodily Function  Patient presents with complaint of urinary retention.  Patient's last reported void was approximately 12 hours prior to arrival.  Patient complains of suprapubic pain and his bladder is distended on exam.  Placement of catheter made the patient comfortable.  Immediately production approximately 600 to 700 mL of clear urine noted.  Patient with prior history of urinary retention and prior history of Foley catheter placement.  Patient is known to [facility].  Patient understands need for close outpatient follow-up. Patient's catheter will remain in place until removed by urology.  Importance of close follow-up is stressed.  Strict return precautions given and understood.  Co morbidities that complicated the patient's evaluation  Diabetes, history of urinary retention   Additional history obtained:  External records from outside sources obtained and reviewed including prior ED visits and prior Inpatient records.    Lab Tests:  I ordered and personally interpreted labs.  The pertinent results include: CBC, BMP, UA,    Cardiac Monitoring:  The patient was maintained on a cardiac monitor.  I personally viewed and interpreted the cardiac monitor which showed an underlying rhythm of: NSR   Problem List / ED Course:  Urinary retention   Reevaluation:  After the interventions noted above, I reevaluated the patient and found that they have: improved   Disposition:  After consideration of the diagnostic results and the patients response to treatment, I feel that the patent would benefit from close outpatient follow-up.          Final Clinical Impression(s) / ED Diagnoses Final  diagnoses:  Urinary retention    Rx / DC Orders ED Discharge Orders          Ordered    cephALEXin (KEFLEX) 500 MG capsule  4 times daily        09/07/21 0908              Valarie Merino, MD 09/07/21 [phone]

## 2021-09-17 ENCOUNTER — Other Ambulatory Visit: Payer: Self-pay

## 2021-09-17 ENCOUNTER — Ambulatory Visit (HOSPITAL_COMMUNITY)
Admission: EM | Admit: 2021-09-17 | Discharge: 2021-09-17 | Disposition: A | Discharge status: Home / Self Care | Payer: Medicare Other | Attending: Student | Admitting: Student

## 2021-09-17 ENCOUNTER — Encounter (HOSPITAL_COMMUNITY): Payer: Self-pay | Admitting: *Deleted

## 2021-09-17 DIAGNOSIS — Z978 Presence of other specified devices: Secondary | ICD-10-CM

## 2021-09-17 DIAGNOSIS — R319 Hematuria, unspecified: Secondary | ICD-10-CM

## 2021-09-17 DIAGNOSIS — E1165 Type 2 diabetes mellitus with hyperglycemia: Secondary | ICD-10-CM | POA: Diagnosis not present

## 2021-09-17 LAB — POCT URINALYSIS DIPSTICK, ED / UC
Bilirubin Urine: NEGATIVE
Glucose, UA: 500 mg/dL — AB
Ketones, ur: NEGATIVE mg/dL
Leukocytes,Ua: NEGATIVE
Nitrite: NEGATIVE
Protein, ur: 100 mg/dL — AB
Specific Gravity, Urine: 1.01 (ref 1.005–1.030)
Urobilinogen, UA: 0.2 mg/dL (ref 0.0–1.0)
pH: 5.5 (ref 5.0–8.0)

## 2021-09-17 LAB — CBG MONITORING, ED: Glucose-Capillary: 216 mg/dL — ABNORMAL HIGH (ref 70–99)

## 2021-09-17 NOTE — ED Provider Notes (Signed)
[facility]    CSN: [id number] Arrival date & time: 09/17/21  6720      History   Chief Complaint Chief Complaint  Patient presents with   Hematuria    HPI Nathaniel Waters is a 66 y.o. male presenting with hematuria.  He presented to the emergency department for urinary retention on 6/3 and a Foley catheter was placed, this is still in place.  He is followed by urology, but does not see them until 6/28.  He has an appointment with his PCP in 1 day, but they had apparently told him they cannot manage this.  He is asymptomatic, with no abdominal or flank pain, denies new fever/chills.  He states that his fasting CBGs were in the 150s this morning, but he did eat a sausage biscuit prior to his visit today.  HPI  Past Medical History:  Diagnosis Date   Diabetes mellitus    Diabetes mellitus without complication ([place])    GERD (gastroesophageal reflux disease)    Hyperlipidemia    Hypertension     Patient Active Problem List   Diagnosis Date Noted   Chronic hepatitis C without hepatic coma ([place]) 04/11/2019   Hypertension 04/09/2017   Cigarette nicotine dependence with nicotine-induced disorder 04/09/2017   Erectile dysfunction associated with type 2 diabetes mellitus ([place]) 10/23/2014   Dyslipidemia 09/22/2014   Elevated PSA 09/22/2014   Routine general medical examination at a health care facility 09/21/2014   Encounter for general adult medical examination with abnormal findings 09/21/2014   GERD 12/30/2007   Type II diabetes mellitus, uncontrolled 12/29/2007   TUBULOVILLOUS ADENOMA, COLON, HX OF 12/29/2007    Past Surgical History:  Procedure Laterality Date   CARPAL TUNNEL RELEASE     both    COLONOSCOPY  2018       Home Medications    Prior to Admission medications   Medication Sig Start Date End Date Taking? Authorizing Provider  ACCU-CHEK GUIDE test strip TEST UP TO 4 TIMES A DAY 09/04/21   Marrian Salvage, FNP  amLODipine (NORVASC) 5 MG tablet  Take 1 tablet (5 mg total) by mouth daily. 07/09/21   Marrian Salvage, FNP  atorvastatin (LIPITOR) 20 MG tablet Take 1 tablet (20 mg total) by mouth daily. 07/11/21   Marrian Salvage, FNP  blood glucose meter kit and supplies KIT Dispense based on patient and insurance preference. Use up to four times daily as directed. (FOR ICD-9 250.00, 250.01). Patient not taking: Reported on 07/09/2021 04/24/20   Marrian Salvage, FNP  Blood Glucose Monitoring Suppl (ACCU-CHEK AVIVA PLUS) w/Device KIT Use to check blood sugars twice a day 05/12/17   Margot Ables, NP  cephALEXin (KEFLEX) 500 MG capsule Take 1 capsule (500 mg total) by mouth 4 (four) times daily. 09/07/21   Valarie Merino, MD  dapagliflozin propanediol (FARXIGA) 10 MG TABS tablet Take 1 tablet (10 mg total) by mouth daily. 07/09/21   Marrian Salvage, FNP  glipiZIDE (GLUCOTROL XL) 5 MG 24 hr tablet Take 1 tablet (5 mg total) by mouth daily with breakfast. 07/09/21   Marrian Salvage, FNP  hydrocortisone 2.5 % cream Apply topically 2 (two) times daily. Patient not taking: Reported on 08/01/2020 12/10/16   Gold[place] Circle, FNP  Lancets (ACCU-CHEK MULTICLIX) lancets Use to help check blood sugars twice a day 05/12/17   Margot Ables, NP  lisinopril (ZESTRIL) 10 MG tablet TAKE 1 TABLET BY MOUTH EVERY DAY 08/13/21   Jodi Mourning  Jasmine December, FNP  omeprazole (PRILOSEC) 10 MG capsule TAKE 1 CAPSULE BY MOUTH EVERY DAY 07/09/21   Marrian Salvage, FNP  tamsulosin (FLOMAX) 0.4 MG CAPS capsule Take 1 capsule (0.4 mg total) by mouth daily. 07/09/21   Marrian Salvage, FNP  triamcinolone cream (KENALOG) 0.1 % Apply 1 application topically 2 (two) times daily. Patient not taking: Reported on 08/01/2020 06/21/18   Margot Ables, NP    Family History Family History  Problem Relation Age of Onset   Breast cancer Mother    Heart disease Brother    Lung cancer Father    Healthy Paternal Grandmother     Cancer Brother    Colon cancer Neg Hx    Esophageal cancer Neg Hx    Stomach cancer Neg Hx    Rectal cancer Neg Hx    Colon polyps Neg Hx     Social History Social History   Tobacco Use   Smoking status: Light Smoker    Packs/day: 0.25    Types: Cigarettes   Smokeless tobacco: Never   Tobacco comments:    Actively in the process of quitting  Vaping Use   Vaping Use: Never used  Substance Use Topics   Alcohol use: Yes    Comment: occasionally on weekend   Drug use: No     Allergies   Metformin and related and Jardiance [empagliflozin]   Review of Systems Review of Systems  Constitutional:  Negative for appetite change, chills, diaphoresis and fever.  Respiratory:  Negative for shortness of breath.   Cardiovascular:  Negative for chest pain.  Gastrointestinal:  Negative for abdominal pain, blood in stool, constipation, diarrhea, nausea and vomiting.  Genitourinary:  Positive for hematuria. Negative for decreased urine volume, difficulty urinating, dysuria, flank pain, frequency, genital sores and urgency.  Musculoskeletal:  Negative for back pain.  Neurological:  Negative for dizziness, weakness and light-headedness.  All other systems reviewed and are negative.    Physical Exam Triage Vital Signs ED Triage Vitals  Enc Vitals Group     BP 09/17/21 0845 (!) 161/86     Pulse Rate 09/17/21 0845 86     Resp 09/17/21 0845 20     Temp 09/17/21 0845 99.5 F (37.5 C)     Temp src --      SpO2 09/17/21 0845 95 %     Weight --      Height --      Head Circumference --      Peak Flow --      Pain Score 09/17/21 0843 2     Pain Loc --      Pain Edu? --      Excl. in [place]? --    No data found.  Updated Vital Signs BP (!) 161/86   Pulse 86   Temp 99.5 F (37.5 C)   Resp 20   SpO2 95%   Visual Acuity Right Eye Distance:   Left Eye Distance:   Bilateral Distance:    Right Eye Near:   Left Eye Near:    Bilateral Near:     Physical Exam Vitals reviewed.   Constitutional:      General: He is not in acute distress.    Appearance: Normal appearance. He is not ill-appearing.  HENT:     Head: Normocephalic and atraumatic.     Mouth/Throat:     Mouth: Mucous membranes are moist.     Comments: Moist mucous membranes Eyes:     Extraocular  Movements: Extraocular movements intact.     Pupils: Pupils are equal, round, and reactive to light.  Cardiovascular:     Rate and Rhythm: Normal rate and regular rhythm.     Heart sounds: Normal heart sounds.  Pulmonary:     Effort: Pulmonary effort is normal.     Breath sounds: Normal breath sounds. No wheezing, rhonchi or rales.  Abdominal:     General: Bowel sounds are normal. There is no distension.     Palpations: Abdomen is soft. There is no mass.     Tenderness: There is no abdominal tenderness. There is no right CVA tenderness, left CVA tenderness, guarding or rebound.  Skin:    General: Skin is warm.     Capillary Refill: Capillary refill takes less than 2 seconds.     Comments: Good skin turgor  Neurological:     General: No focal deficit present.     Mental Status: He is alert and oriented to person, place, and time.  Psychiatric:        Mood and Affect: Mood normal.        Behavior: Behavior normal.      UC Treatments / Results  Labs (all labs ordered are listed, but only abnormal results are displayed) Labs Reviewed  POCT URINALYSIS DIPSTICK, ED / UC - Abnormal; Notable for the following components:      Result Value   Glucose, UA 500 (*)    Hgb urine dipstick LARGE (*)    Protein, ur 100 (*)    All other components within normal limits  CBG MONITORING, ED - Abnormal; Notable for the following components:   Glucose-Capillary 216 (*)    All other components within normal limits    EKG   Radiology No results found.  Procedures Procedures (including critical care time)  Medications Ordered in UC Medications - No data to display  Initial Impression / Assessment and  Plan / UC Course  I have reviewed the triage vital signs and the nursing notes.  Pertinent labs & imaging results that were available during my care of the patient were reviewed by me and considered in my medical decision making (see chart for details).     This patient is a very pleasant 66 y.o. year old male presenting with hematuria related to foley catheter that is in place x10 days following ED visit for urinary retention. He has a f/u with urology on 6/28, and a PCP appt in 1 day on 09/18/21. He is asymptomatic, with no abd pain, flank pain, or fevers/chills. His UA shows blood and glucose >500; nonfasting CBG today is 216. Continue monitoring sugars at home. F/u with PCP tomorrow, and call urology today to move up the appt. ED return precautions discussed. Patient verbalizes understanding and agreement.     Final Clinical Impressions(s) / UC Diagnoses   Final diagnoses:  Hematuria, unspecified type  Foley catheter in place  Type 2 diabetes mellitus with hyperglycemia, without long-term current use of insulin [facility])     Discharge Instructions      -Please check your blood pressure at home or at the pharmacy. If this continues to be >140/90, follow-up with your primary care provider for further blood pressure management/ medication titration. If you develop chest pain, shortness of breath, vision changes, the worst headache of your life- head straight to the ED or call 911. -Continue monitoring sugars  -Call urology today to discuss moving up your appointment -Head to the emergency department if new symptoms  like abdominal pain, new back or flank pain, new fevers, etc.   ED Prescriptions   None    PDMP not reviewed this encounter.   Hazel Sams, PA-C 09/17/21 [phone]

## 2021-09-17 NOTE — Discharge Instructions (Addendum)
-  Please check your blood pressure at home or at the pharmacy. If this continues to be >140/90, follow-up with your primary care provider for further blood pressure management/ medication titration. If you develop chest pain, shortness of breath, vision changes, the worst headache of your life- head straight to the ED or call 911. -Continue monitoring sugars  -Call urology today to discuss moving up your appointment -Head to the emergency department if new symptoms like abdominal pain, new back or flank pain, new fevers, etc.

## 2021-09-17 NOTE — ED Triage Notes (Signed)
PT reports blood in urine and he has a foley cath. Pt has an appt June 28 . Pt has a PCP appt. Tomorrow. Pt reports his PCP told him nothing could be done in PCP office for blood in urine.

## 2021-09-18 ENCOUNTER — Ambulatory Visit (INDEPENDENT_AMBULATORY_CARE_PROVIDER_SITE_OTHER): Payer: Medicare Other | Admitting: Emergency Medicine

## 2021-09-18 ENCOUNTER — Encounter: Payer: Self-pay | Admitting: Emergency Medicine

## 2021-09-18 VITALS — BP 158/96 | HR 90 | Temp 98.2°F | Ht 71.0 in | Wt 228.5 lb

## 2021-09-18 DIAGNOSIS — B182 Chronic viral hepatitis C: Secondary | ICD-10-CM

## 2021-09-18 DIAGNOSIS — E1159 Type 2 diabetes mellitus with other circulatory complications: Secondary | ICD-10-CM

## 2021-09-18 DIAGNOSIS — I152 Hypertension secondary to endocrine disorders: Secondary | ICD-10-CM | POA: Diagnosis not present

## 2021-09-18 DIAGNOSIS — Z7689 Persons encountering health services in other specified circumstances: Secondary | ICD-10-CM | POA: Diagnosis not present

## 2021-09-18 DIAGNOSIS — E785 Hyperlipidemia, unspecified: Secondary | ICD-10-CM

## 2021-09-18 DIAGNOSIS — E1169 Type 2 diabetes mellitus with other specified complication: Secondary | ICD-10-CM | POA: Diagnosis not present

## 2021-09-18 DIAGNOSIS — N401 Enlarged prostate with lower urinary tract symptoms: Secondary | ICD-10-CM

## 2021-09-18 DIAGNOSIS — I10 Essential (primary) hypertension: Secondary | ICD-10-CM | POA: Diagnosis not present

## 2021-09-18 DIAGNOSIS — K219 Gastro-esophageal reflux disease without esophagitis: Secondary | ICD-10-CM | POA: Diagnosis not present

## 2021-09-18 DIAGNOSIS — N138 Other obstructive and reflux uropathy: Secondary | ICD-10-CM

## 2021-09-18 MED ORDER — DAPAGLIFLOZIN PROPANEDIOL 10 MG PO TABS: 10.0000 mg | ORAL_TABLET | Freq: Every day | ORAL | 3 refills | Status: DC

## 2021-09-18 MED ORDER — VALSARTAN 80 MG PO TABS: 80.0000 mg | ORAL_TABLET | Freq: Every day | ORAL | 3 refills | Status: DC

## 2021-09-18 MED ORDER — ROSUVASTATIN CALCIUM 20 MG PO TABS: 20.0000 mg | ORAL_TABLET | Freq: Every day | ORAL | 3 refills | Status: DC

## 2021-09-18 NOTE — Assessment & Plan Note (Signed)
Currently active.  Foley catheter in place.  Scheduled to see urologist tomorrow.

## 2021-09-18 NOTE — Patient Instructions (Signed)

## 2021-09-18 NOTE — Assessment & Plan Note (Signed)
Elevated blood pressure readings. Continue multipin 5 mg daily.  Stop lisinopril. Start valsartan 80 mg daily. Advised to monitor blood pressure readings at home daily for the next several weeks and keep a log. Dietary approaches to stop hypertension discussed. Last hemoglobin A1c at 7.5 on 07/09/2021.  Continue glipizide 5 mg daily. Start Farxiga 10 mg daily. Cardiovascular risks associated with hypertension and diabetes discussed. Diet and nutrition discussed. Follow-up in 2 months.

## 2021-09-18 NOTE — Assessment & Plan Note (Signed)
Last lipid profile abnormal.  Stop atorvastatin and start rosuvastatin 20 mg daily. Repeat lipid profile in 2 months. Diet and nutrition discussed. The 10-year ASCVD risk score (Arnett DK, et al., 2019) is: 65.6%   Values used to calculate the score:     Age: 66 years     Sex: Male     Is Non-Hispanic African American: Yes     Diabetic: Yes     Tobacco smoker: Yes     Systolic Blood Pressure: 710 mmHg     Is BP treated: Yes     HDL Cholesterol: 40.7 mg/dL     Total Cholesterol: 286 mg/dL

## 2021-09-18 NOTE — Progress Notes (Signed)
Nathaniel Waters 66 y.o.   Chief Complaint  Patient presents with   New Patient (Initial Visit)    Concerns about blood pressure     HISTORY OF PRESENT ILLNESS: This is a 66 y.o. male here to establish care with me.  Used to see Marvis Repress, NP. Has multiple chronic medical problems: 1.  Diabetes: Presently on glipizide only.  Used to be on Farxiga which was working well for him 2.  Hypertension: Uncontrolled.  Takes lisinopril 10 mg daily. 3.  History of BPH with lower urinary tract symptoms and urinary retention.  Recent emergency department visit for hematuria.  Scheduled to see urologist tomorrow.  Foley catheter in place at present moment.  Takes Flomax 0.4 mg daily. 4.  History of dyslipidemia on atorvastatin BP Readings from Last 3 Encounters:  09/17/21 (!) 161/86  09/07/21 140/86  07/09/21 (!) 154/100   Lab Results  Component Value Date   HGBA1C 7.5 (H) 07/09/2021     HPI   Prior to Admission medications   Medication Sig Start Date End Date Taking? Authorizing Provider  ACCU-CHEK GUIDE test strip TEST UP TO 4 TIMES A DAY 09/04/21   Marrian Salvage, FNP  amLODipine (NORVASC) 5 MG tablet Take 1 tablet (5 mg total) by mouth daily. 07/09/21   Marrian Salvage, FNP  atorvastatin (LIPITOR) 20 MG tablet Take 1 tablet (20 mg total) by mouth daily. 07/11/21   Marrian Salvage, FNP  blood glucose meter kit and supplies KIT Dispense based on patient and insurance preference. Use up to four times daily as directed. (FOR ICD-9 250.00, 250.01). Patient not taking: Reported on 07/09/2021 04/24/20   Marrian Salvage, FNP  Blood Glucose Monitoring Suppl (ACCU-CHEK AVIVA PLUS) w/Device KIT Use to check blood sugars twice a day 05/12/17   Margot Ables, NP  cephALEXin (KEFLEX) 500 MG capsule Take 1 capsule (500 mg total) by mouth 4 (four) times daily. 09/07/21   Valarie Merino, MD  dapagliflozin propanediol (FARXIGA) 10 MG TABS tablet Take 1 tablet (10 mg total)  by mouth daily. 07/09/21   Marrian Salvage, FNP  glipiZIDE (GLUCOTROL XL) 5 MG 24 hr tablet Take 1 tablet (5 mg total) by mouth daily with breakfast. 07/09/21   Marrian Salvage, FNP  hydrocortisone 2.5 % cream Apply topically 2 (two) times daily. Patient not taking: Reported on 08/01/2020 12/10/16   Gold[place] Circle, FNP  Lancets (ACCU-CHEK MULTICLIX) lancets Use to help check blood sugars twice a day 05/12/17   Margot Ables, NP  lisinopril (ZESTRIL) 10 MG tablet TAKE 1 TABLET BY MOUTH EVERY DAY 08/13/21   Marrian Salvage, FNP  omeprazole (PRILOSEC) 10 MG capsule TAKE 1 CAPSULE BY MOUTH EVERY DAY 07/09/21   Marrian Salvage, FNP  tamsulosin (FLOMAX) 0.4 MG CAPS capsule Take 1 capsule (0.4 mg total) by mouth daily. 07/09/21   Marrian Salvage, FNP  triamcinolone cream (KENALOG) 0.1 % Apply 1 application topically 2 (two) times daily. Patient not taking: Reported on 08/01/2020 06/21/18   Margot Ables, NP    Allergies  Allergen Reactions   Metformin And Related Diarrhea   Jardiance [Empagliflozin] Rash    Patient Active Problem List   Diagnosis Date Noted   Chronic hepatitis C without hepatic coma ([place]) 04/11/2019   Hypertension associated with diabetes ([place]) 04/09/2017   Cigarette nicotine dependence with nicotine-induced disorder 04/09/2017   Dyslipidemia associated with type 2 diabetes mellitus ([place]) 10/23/2014   Dyslipidemia 09/22/2014  Elevated PSA 09/22/2014   GERD 12/30/2007   Type II diabetes mellitus, uncontrolled 12/29/2007   TUBULOVILLOUS ADENOMA, COLON, HX OF 12/29/2007    Past Medical History:  Diagnosis Date   Diabetes mellitus    Diabetes mellitus without complication (HCC)    GERD (gastroesophageal reflux disease)    Hyperlipidemia    Hypertension     Past Surgical History:  Procedure Laterality Date   CARPAL TUNNEL RELEASE     both    COLONOSCOPY  2018    Social History   Socioeconomic History   Marital  status: Legally Separated    Spouse name: Not on file   Number of children: 4   Years of education: 11   Highest education level: Not on file  Occupational History   Occupation: Loading [employer]  Tobacco Use   Smoking status: Light Smoker    Packs/day: 0.25    Types: Cigarettes   Smokeless tobacco: Never   Tobacco comments:    Actively in the process of quitting  Vaping Use   Vaping Use: Never used  Substance and Sexual Activity   Alcohol use: Yes    Comment: occasionally on weekend   Drug use: No   Sexual activity: Not on file  Other Topics Concern   Not on file  Social History Narrative   ** Merged History Encounter **       Fun: Listen to music and be around family and friends. Denies religious beliefs effecting health care.    Social Determinants of Health   Financial Resource Strain: Not on file  Food Insecurity: Not on file  Transportation Needs: Not on file  Physical Activity: Not on file  Stress: Not on file  Social Connections: Not on file  Intimate Partner Violence: Not on file    Family History  Problem Relation Age of Onset   Breast cancer Mother    Heart disease Brother    Lung cancer Father    Healthy Paternal Grandmother    Cancer Brother    Colon cancer Neg Hx    Esophageal cancer Neg Hx    Stomach cancer Neg Hx    Rectal cancer Neg Hx    Colon polyps Neg Hx      Review of Systems  Constitutional: Negative.  Negative for chills and fever.  HENT: Negative.  Negative for congestion and sore throat.   Respiratory: Negative.  Negative for cough and hemoptysis.   Cardiovascular:  Negative for chest pain and palpitations.  Gastrointestinal:  Negative for abdominal pain, diarrhea, nausea and vomiting.  Genitourinary:  Positive for hematuria.       Urinary retention  Skin: Negative.  Negative for rash.  Neurological:  Negative for dizziness and headaches.  All other systems reviewed and are negative.  Today's Vitals   09/18/21 1018 09/18/21 1024   BP: (!) 152/92 (!) 158/96  Pulse: 90   Temp: 98.2 F (36.8 C)   TempSrc: Oral   SpO2: 97%   Weight: 228 lb 8 oz (103.6 kg)   Height: '5\' 11"'  (1.803 m)    Body mass index is 31.87 kg/m.   Physical Exam Vitals reviewed.  Constitutional:      Appearance: Normal appearance.  HENT:     Head: Normocephalic.     Mouth/Throat:     Mouth: Mucous membranes are moist.     Pharynx: Oropharynx is clear.  Eyes:     Extraocular Movements: Extraocular movements intact.     Pupils: Pupils are equal, round,  and reactive to light.  Cardiovascular:     Rate and Rhythm: Normal rate and regular rhythm.     Pulses: Normal pulses.     Heart sounds: Normal heart sounds.  Pulmonary:     Effort: Pulmonary effort is normal.     Breath sounds: Normal breath sounds.  Musculoskeletal:     Cervical back: No tenderness.  Lymphadenopathy:     Cervical: No cervical adenopathy.  Skin:    General: Skin is warm and dry.     Capillary Refill: Capillary refill takes less than 2 seconds.  Neurological:     General: No focal deficit present.     Mental Status: He is alert and oriented to person, place, and time.  Psychiatric:        Mood and Affect: Mood normal.        Behavior: Behavior normal.     ASSESSMENT & PLAN: A total of 51 minutes was spent with the patient and counseling/coordination of care regarding preparing for this visit, review of most recent office visit notes, establishing care with me, review of multiple chronic medical problems and their management, review of all medications, cardiovascular risks associated with uncontrolled hypertension and diabetes, review of most recent blood work results, education on nutrition, need to follow-up with urologist for BPH and urinary retention, prognosis, documentation, and need for follow-up.  Problem List Items Addressed This Visit       Cardiovascular and Mediastinum   Hypertension associated with diabetes ([place]) - Primary    Elevated blood  pressure readings. Continue multipin 5 mg daily.  Stop lisinopril. Start valsartan 80 mg daily. Advised to monitor blood pressure readings at home daily for the next several weeks and keep a log. Dietary approaches to stop hypertension discussed. Last hemoglobin A1c at 7.5 on 07/09/2021.  Continue glipizide 5 mg daily. Start Farxiga 10 mg daily. Cardiovascular risks associated with hypertension and diabetes discussed. Diet and nutrition discussed. Follow-up in 2 months.      Relevant Medications   dapagliflozin propanediol (FARXIGA) 10 MG TABS tablet   valsartan (DIOVAN) 80 MG tablet   rosuvastatin (CRESTOR) 20 MG tablet     Digestive   GERD   Chronic hepatitis C without hepatic coma (HCC)     Endocrine   Dyslipidemia associated with type 2 diabetes mellitus ([place])    Last lipid profile abnormal.  Stop atorvastatin and start rosuvastatin 20 mg daily. Repeat lipid profile in 2 months. Diet and nutrition discussed. The 10-year ASCVD risk score (Arnett DK, et al., 2019) is: 65.6%   Values used to calculate the score:     Age: 5 years     Sex: Male     Is Non-Hispanic African American: Yes     Diabetic: Yes     Tobacco smoker: Yes     Systolic Blood Pressure: 956 mmHg     Is BP treated: Yes     HDL Cholesterol: 40.7 mg/dL     Total Cholesterol: 286 mg/dL       Relevant Medications   dapagliflozin propanediol (FARXIGA) 10 MG TABS tablet   valsartan (DIOVAN) 80 MG tablet   rosuvastatin (CRESTOR) 20 MG tablet     Genitourinary   BPH with obstruction/lower urinary tract symptoms    Currently active.  Foley catheter in place.  Scheduled to see urologist tomorrow.      Other Visit Diagnoses     Encounter to establish care          Patient Instructions  Diabetes Mellitus and Nutrition, Adult When you have diabetes, or diabetes mellitus, it is very important to have healthy eating habits because your blood sugar (glucose) levels are greatly affected by what you eat and  drink. Eating healthy foods in the right amounts, at about the same times every day, can help you: Manage your blood glucose. Lower your risk of heart disease. Improve your blood pressure. Reach or maintain a healthy weight. What can affect my meal plan? Every person with diabetes is different, and each person has different needs for a meal plan. Your health care provider may recommend that you work with a dietitian to make a meal plan that is best for you. Your meal plan may vary depending on factors such as: The calories you need. The medicines you take. Your weight. Your blood glucose, blood pressure, and cholesterol levels. Your activity level. Other health conditions you have, such as heart or kidney disease. How do carbohydrates affect me? Carbohydrates, also called carbs, affect your blood glucose level more than any other type of food. Eating carbs raises the amount of glucose in your blood. It is important to know how many carbs you can safely have in each meal. This is different for every person. Your dietitian can help you calculate how many carbs you should have at each meal and for each snack. How does alcohol affect me? Alcohol can cause a decrease in blood glucose (hypoglycemia), especially if you use insulin or take certain diabetes medicines by mouth. Hypoglycemia can be a life-threatening condition. Symptoms of hypoglycemia, such as sleepiness, dizziness, and confusion, are similar to symptoms of having too much alcohol. Do not drink alcohol if: Your health care provider tells you not to drink. You are pregnant, may be pregnant, or are planning to become pregnant. If you drink alcohol: Limit how much you have to: 0-1 drink a day for women. 0-2 drinks a day for men. Know how much alcohol is in your drink. In the [place] one drink equals one 12 oz bottle of beer (355 mL), one 5 oz glass of wine (148 mL), or one 1 oz glass of hard liquor (44 mL). Keep yourself hydrated with  water, diet soda, or unsweetened iced tea. Keep in mind that regular soda, juice, and other mixers may contain a lot of sugar and must be counted as carbs. What are tips for following this plan?  Reading food labels Start by checking the serving size on the Nutrition Facts label of packaged foods and drinks. The number of calories and the amount of carbs, fats, and other nutrients listed on the label are based on one serving of the item. Many items contain more than one serving per package. Check the total grams (g) of carbs in one serving. Check the number of grams of saturated fats and trans fats in one serving. Choose foods that have a low amount or none of these fats. Check the number of milligrams (mg) of salt (sodium) in one serving. Most people should limit total sodium intake to less than 2,300 mg per day. Always check the nutrition information of foods labeled as "low-fat" or "nonfat." These foods may be higher in added sugar or refined carbs and should be avoided. Talk to your dietitian to identify your daily goals for nutrients listed on the label. Shopping Avoid buying canned, pre-made, or processed foods. These foods tend to be high in fat, sodium, and added sugar. Shop around the outside edge of the grocery store. This is where  you will most often find fresh fruits and vegetables, bulk grains, fresh meats, and fresh dairy products. Cooking Use low-heat cooking methods, such as baking, instead of high-heat cooking methods, such as deep frying. Cook using healthy oils, such as olive, canola, or sunflower oil. Avoid cooking with butter, cream, or high-fat meats. Meal planning Eat meals and snacks regularly, preferably at the same times every day. Avoid going long periods of time without eating. Eat foods that are high in fiber, such as fresh fruits, vegetables, beans, and whole grains. Eat 4-6 oz (112-168 g) of lean protein each day, such as lean meat, chicken, fish, eggs, or tofu. One  ounce (oz) (28 g) of lean protein is equal to: 1 oz (28 g) of meat, chicken, or fish. 1 egg.  cup (62 g) of tofu. Eat some foods each day that contain healthy fats, such as avocado, nuts, seeds, and fish. What foods should I eat? Fruits Berries. Apples. Oranges. Peaches. Apricots. Plums. Grapes. Mangoes. Papayas. Pomegranates. Kiwi. Cherries. Vegetables Leafy greens, including lettuce, spinach, kale, chard, collard greens, mustard greens, and cabbage. Beets. Cauliflower. Broccoli. Carrots. Green beans. Tomatoes. Peppers. Onions. Cucumbers. Brussels sprouts. Grains Whole grains, such as whole-wheat or whole-grain bread, crackers, tortillas, cereal, and pasta. Unsweetened oatmeal. Quinoa. Brown or wild rice. Meats and other proteins Seafood. Poultry without skin. Lean cuts of poultry and beef. Tofu. Nuts. Seeds. Dairy Low-fat or fat-free dairy products such as milk, yogurt, and cheese. The items listed above may not be a complete list of foods and beverages you can eat and drink. Contact a dietitian for more information. What foods should I avoid? Fruits Fruits canned with syrup. Vegetables Canned vegetables. Frozen vegetables with butter or cream sauce. Grains Refined white flour and flour products such as bread, pasta, snack foods, and cereals. Avoid all processed foods. Meats and other proteins Fatty cuts of meat. Poultry with skin. Breaded or fried meats. Processed meat. Avoid saturated fats. Dairy Full-fat yogurt, cheese, or milk. Beverages Sweetened drinks, such as soda or iced tea. The items listed above may not be a complete list of foods and beverages you should avoid. Contact a dietitian for more information. Questions to ask a health care provider Do I need to meet with a certified diabetes care and education specialist? Do I need to meet with a dietitian? What number can I call if I have questions? When are the best times to check my blood glucose? Where to find more  information: American Diabetes Association: diabetes.org Academy of Nutrition and Dietetics: eatright.[employer] of Diabetes and Digestive and Kidney Diseases: [employer] Association of Diabetes Care & Education Specialists: diabeteseducator.org Summary It is important to have healthy eating habits because your blood sugar (glucose) levels are greatly affected by what you eat and drink. It is important to use alcohol carefully. A healthy meal plan will help you manage your blood glucose and lower your risk of heart disease. Your health care provider may recommend that you work with a dietitian to make a meal plan that is best for you. This information is not intended to replace advice given to you by your health care provider. Make sure you discuss any questions you have with your health care provider. Document Revised: 10/26/2019 Document Reviewed: 10/26/2019 Elsevier Patient Education  [place], MD [facility]

## 2021-09-19 DIAGNOSIS — R338 Other retention of urine: Secondary | ICD-10-CM | POA: Diagnosis not present

## 2021-09-24 ENCOUNTER — Other Ambulatory Visit: Payer: Self-pay | Admitting: Urology

## 2021-09-24 NOTE — Progress Notes (Signed)
Anesthesia Review:  PCP: Agustina Caroli LOV 09/18/21  Cardiologist : Chest x-ray : EKG : Echo : Stress test: Cardiac Cath :  Activity level:  Sleep Study/ CPAP : Fasting Blood Sugar :      / Checks Blood Sugar -- times a day:   Blood Thinner/ Instructions /Last Dose: ASA / Instructions/ Last Dose :   DM- type  Hgba1c-  In ED on 09/07/21 for urinary retention  In ED on 09/17/21 fopr hematuria  09/07/21- CBC and BMp in epic - white count- 11.0

## 2021-09-24 NOTE — Progress Notes (Signed)
DUE TO COVID-19 ONLY  2  VISITOR IS ALLOWED TO COME WITH YOU AND STAY IN THE WAITING ROOM ONLY DURING PRE OP AND PROCEDURE DAY OF SURGERY.  4 VISITOR  MAY VISIT WITH YOU AFTER SURGERY IN YOUR PRIVATE ROOM DURING VISITING HOURS ONLY! YOU MAY HAVE ONE PERSON SPEND THE NITE WITH YOU IN YOUR ROOM AFTER SURGERY.       Your procedure is scheduled on:   09/27/21   Report to [facility] Main  Entrance   Report to admitting at       1000 am  DO NOT BRING INSURANCE CARD, PICTURE ID OR WALLET DAY OF SURGERY.      Call this number if you have problems the morning of surgery [phone]    REMEMBER: NO  SOLID FOODS , CANDY, GUM OR MINTS AFTER [place] .       Marland Kitchen CLEAR LIQUIDS UNTIL    0900am             DAY OF SURGERY.        CLEAR LIQUID DIET   Foods Allowed      WATER BLACK COFFEE ( SUGAR OK, NO MILK, CREAM OR CREAMER) REGULAR AND DECAF  TEA ( SUGAR OK NO MILK, CREAM, OR CREAMER) REGULAR AND DECAF  PLAIN JELLO ( NO RED)  FRUIT ICES ( NO RED, NO FRUIT PULP)  POPSICLES ( NO RED)  JUICE- APPLE, WHITE GRAPE AND WHITE CRANBERRY  SPORT DRINK LIKE GATORADE ( NO RED)  CLEAR BROTH ( VEGETABLE , CHICKEN OR BEEF)                                                                     BRUSH YOUR TEETH MORNING OF SURGERY AND RINSE YOUR MOUTH OUT, NO CHEWING GUM CANDY OR MINTS.     Take these medicines the morning of surgery with A SIP OF WATER:  proscar    DO NOT TAKE ANY DIABETIC MEDICATIONS DAY OF YOUR SURGERY                               You may not have any metal on your body including hair pins and              piercings  Do not wear jewelry, make-up, lotions, powders or perfumes, deodorant             Do not wear nail polish on your fingernails.              IF YOU ARE A MALE AND WANT TO SHAVE UNDER ARMS OR LEGS PRIOR TO SURGERY YOU MUST DO SO AT LEAST 48 HOURS PRIOR TO SURGERY.              Men may shave face and neck.   Do not bring valuables to the  hospital. [place].  Contacts, dentures or bridgework may not be worn into surgery.  Leave suitcase in the car. After surgery it may be brought to your room.     Patients discharged the day of surgery  will not be allowed to drive home. IF YOU ARE HAVING SURGERY AND GOING HOME THE SAME DAY, YOU MUST HAVE AN ADULT TO DRIVE YOU HOME AND BE WITH YOU FOR 24 HOURS. YOU MAY GO HOME BY TAXI OR UBER OR ORTHERWISE, BUT AN ADULT MUST ACCOMPANY YOU HOME AND STAY WITH YOU FOR 24 HOURS.                Please read over the following fact sheets you were given: _____________________________________________________________________  [facility] - Preparing for Surgery Before surgery, you can play an important role.  Because skin is not sterile, your skin needs to be as free of germs as possible.  You can reduce the number of germs on your skin by washing with CHG (chlorahexidine gluconate) soap before surgery.  CHG is an antiseptic cleaner which kills germs and bonds with the skin to continue killing germs even after washing. Please DO NOT use if you have an allergy to CHG or antibacterial soaps.  If your skin becomes reddened/irritated stop using the CHG and inform your nurse when you arrive at Short Stay. Do not shave (including legs and underarms) for at least 48 hours prior to the first CHG shower.  You may shave your face/neck. Please follow these instructions carefully:  1.  Shower with CHG Soap the night before surgery and the  morning of Surgery.  2.  If you choose to wash your hair, wash your hair first as usual with your  normal  shampoo.  3.  After you shampoo, rinse your hair and body thoroughly to remove the  shampoo.                           4.  Use CHG as you would any other liquid soap.  You can apply chg directly  to the skin and wash                       Gently with a scrungie or clean washcloth.  5.  Apply the CHG Soap to your body ONLY FROM  THE NECK DOWN.   Do not use on face/ open                           Wound or open sores. Avoid contact with eyes, ears mouth and genitals (private parts).                       Wash face,  Genitals (private parts) with your normal soap.             6.  Wash thoroughly, paying special attention to the area where your surgery  will be performed.  7.  Thoroughly rinse your body with warm water from the neck down.  8.  DO NOT shower/wash with your normal soap after using and rinsing off  the CHG Soap.                9.  Pat yourself dry with a clean towel.            10.  Wear clean pajamas.            11.  Place clean sheets on your bed the night of your first shower and do not  sleep with pets. Day of Surgery : Do not apply any lotions/deodorants the morning of surgery.  Please wear  clean clothes to the hospital/surgery center.  FAILURE TO FOLLOW THESE INSTRUCTIONS MAY RESULT IN THE CANCELLATION OF YOUR SURGERY PATIENT SIGNATURE_________________________________  NURSE SIGNATURE__________________________________  ________________________________________________________________________

## 2021-09-25 ENCOUNTER — Encounter (HOSPITAL_COMMUNITY)
Admission: RE | Admit: 2021-09-25 | Discharge: 2021-09-25 | Disposition: A | Discharge status: Home / Self Care | Payer: Medicare Other | Source: Ambulatory Visit | Attending: Urology | Admitting: Urology

## 2021-09-25 ENCOUNTER — Other Ambulatory Visit: Payer: Self-pay

## 2021-09-25 ENCOUNTER — Encounter (HOSPITAL_COMMUNITY): Payer: Self-pay

## 2021-09-25 DIAGNOSIS — Z01818 Encounter for other preprocedural examination: Secondary | ICD-10-CM | POA: Diagnosis not present

## 2021-09-25 DIAGNOSIS — N401 Enlarged prostate with lower urinary tract symptoms: Secondary | ICD-10-CM | POA: Diagnosis not present

## 2021-09-25 HISTORY — DX: Pneumonia, unspecified organism: J18.9

## 2021-09-25 LAB — BASIC METABOLIC PANEL
Anion gap: 11 (ref 5–15)
BUN: 21 mg/dL (ref 8–23)
CO2: 23 mmol/L (ref 22–32)
Calcium: 9.6 mg/dL (ref 8.9–10.3)
Chloride: 103 mmol/L (ref 98–111)
Creatinine, Ser: 1.33 mg/dL — ABNORMAL HIGH (ref 0.61–1.24)
GFR, Estimated: 59 mL/min — ABNORMAL LOW (ref >60)
Glucose, Bld: 207 mg/dL — ABNORMAL HIGH (ref 70–99)
Potassium: 4 mmol/L (ref 3.5–5.1)
Sodium: 137 mmol/L (ref 135–145)

## 2021-09-25 LAB — HEMOGLOBIN A1C
Hgb A1c MFr Bld: 8.3 % — ABNORMAL HIGH (ref 4.8–5.6)
Mean Plasma Glucose: 191.51 mg/dL

## 2021-09-25 LAB — CBC
HCT: 49.1 % (ref 39.0–52.0)
Hemoglobin: 16.3 g/dL (ref 13.0–17.0)
MCH: 30.7 pg (ref 26.0–34.0)
MCHC: 33.2 g/dL (ref 30.0–36.0)
MCV: 92.5 fL (ref 80.0–100.0)
Platelets: 215 10*3/uL (ref 150–400)
RBC: 5.31 MIL/uL (ref 4.22–5.81)
RDW: 12.9 % (ref 11.5–15.5)
WBC: 8.6 10*3/uL (ref 4.0–10.5)
nRBC: 0 % (ref 0.0–0.2)

## 2021-09-25 LAB — GLUCOSE, CAPILLARY: Glucose-Capillary: 206 mg/dL — ABNORMAL HIGH (ref 70–99)

## 2021-09-27 ENCOUNTER — Encounter (HOSPITAL_COMMUNITY)
Admission: RE | Disposition: A | Discharge status: Home / Self Care | Payer: Self-pay | Source: Home / Self Care | Attending: Urology

## 2021-09-27 ENCOUNTER — Ambulatory Visit (HOSPITAL_COMMUNITY): Payer: Medicare Other | Admitting: Certified Registered Nurse Anesthetist

## 2021-09-27 ENCOUNTER — Ambulatory Visit (HOSPITAL_COMMUNITY)
Admission: RE | Admit: 2021-09-27 | Discharge: 2021-09-28 | Disposition: A | Discharge status: Home / Self Care | Payer: Medicare Other | Attending: Urology | Admitting: Urology

## 2021-09-27 ENCOUNTER — Other Ambulatory Visit: Payer: Self-pay

## 2021-09-27 ENCOUNTER — Encounter (HOSPITAL_COMMUNITY): Payer: Self-pay | Admitting: Urology

## 2021-09-27 ENCOUNTER — Ambulatory Visit (HOSPITAL_BASED_OUTPATIENT_CLINIC_OR_DEPARTMENT_OTHER): Payer: Medicare Other | Admitting: Certified Registered Nurse Anesthetist

## 2021-09-27 DIAGNOSIS — I10 Essential (primary) hypertension: Secondary | ICD-10-CM | POA: Insufficient documentation

## 2021-09-27 DIAGNOSIS — Z79899 Other long term (current) drug therapy: Secondary | ICD-10-CM | POA: Diagnosis not present

## 2021-09-27 DIAGNOSIS — N5201 Erectile dysfunction due to arterial insufficiency: Secondary | ICD-10-CM | POA: Insufficient documentation

## 2021-09-27 DIAGNOSIS — K219 Gastro-esophageal reflux disease without esophagitis: Secondary | ICD-10-CM | POA: Diagnosis not present

## 2021-09-27 DIAGNOSIS — N400 Enlarged prostate without lower urinary tract symptoms: Secondary | ICD-10-CM

## 2021-09-27 DIAGNOSIS — Z8249 Family history of ischemic heart disease and other diseases of the circulatory system: Secondary | ICD-10-CM | POA: Insufficient documentation

## 2021-09-27 DIAGNOSIS — R338 Other retention of urine: Secondary | ICD-10-CM | POA: Diagnosis not present

## 2021-09-27 DIAGNOSIS — F17210 Nicotine dependence, cigarettes, uncomplicated: Secondary | ICD-10-CM | POA: Diagnosis not present

## 2021-09-27 DIAGNOSIS — N138 Other obstructive and reflux uropathy: Secondary | ICD-10-CM | POA: Diagnosis not present

## 2021-09-27 DIAGNOSIS — N401 Enlarged prostate with lower urinary tract symptoms: Secondary | ICD-10-CM | POA: Insufficient documentation

## 2021-09-27 DIAGNOSIS — E785 Hyperlipidemia, unspecified: Secondary | ICD-10-CM | POA: Insufficient documentation

## 2021-09-27 DIAGNOSIS — Z8619 Personal history of other infectious and parasitic diseases: Secondary | ICD-10-CM | POA: Diagnosis not present

## 2021-09-27 DIAGNOSIS — E119 Type 2 diabetes mellitus without complications: Secondary | ICD-10-CM | POA: Insufficient documentation

## 2021-09-27 DIAGNOSIS — Z7984 Long term (current) use of oral hypoglycemic drugs: Secondary | ICD-10-CM | POA: Insufficient documentation

## 2021-09-27 DIAGNOSIS — Z01818 Encounter for other preprocedural examination: Secondary | ICD-10-CM

## 2021-09-27 HISTORY — PX: TRANSURETHRAL RESECTION OF PROSTATE: SHX73

## 2021-09-27 LAB — CBC
HCT: 44.4 % (ref 39.0–52.0)
Hemoglobin: 14.7 g/dL (ref 13.0–17.0)
MCH: 30.5 pg (ref 26.0–34.0)
MCHC: 33.1 g/dL (ref 30.0–36.0)
MCV: 92.1 fL (ref 80.0–100.0)
Platelets: 196 10*3/uL (ref 150–400)
RBC: 4.82 MIL/uL (ref 4.22–5.81)
RDW: 12.7 % (ref 11.5–15.5)
WBC: 9.3 10*3/uL (ref 4.0–10.5)
nRBC: 0 % (ref 0.0–0.2)

## 2021-09-27 LAB — BASIC METABOLIC PANEL
Anion gap: 5 (ref 5–15)
BUN: 17 mg/dL (ref 8–23)
CO2: 23 mmol/L (ref 22–32)
Calcium: 8.9 mg/dL (ref 8.9–10.3)
Chloride: 109 mmol/L (ref 98–111)
Creatinine, Ser: 1.1 mg/dL (ref 0.61–1.24)
GFR, Estimated: >60 mL/min (ref >60)
Glucose, Bld: 140 mg/dL — ABNORMAL HIGH (ref 70–99)
Potassium: 4.3 mmol/L (ref 3.5–5.1)
Sodium: 137 mmol/L (ref 135–145)

## 2021-09-27 LAB — GLUCOSE, CAPILLARY
Glucose-Capillary: 164 mg/dL — ABNORMAL HIGH (ref 70–99)
Glucose-Capillary: 116 mg/dL — ABNORMAL HIGH (ref 70–99)
Glucose-Capillary: 215 mg/dL — ABNORMAL HIGH (ref 70–99)
Glucose-Capillary: 277 mg/dL — ABNORMAL HIGH (ref 70–99)

## 2021-09-27 SURGERY — TURP (TRANSURETHRAL RESECTION OF PROSTATE)
Anesthesia: General | Site: Prostate

## 2021-09-27 MED ORDER — DEXAMETHASONE SODIUM PHOSPHATE 10 MG/ML IJ SOLN
INTRAMUSCULAR | Status: AC
  Filled 2021-09-27: qty 1

## 2021-09-27 MED ORDER — OXYCODONE HCL 5 MG PO TABS: 5.0000 mg | ORAL_TABLET | Freq: Once | ORAL | Status: DC | PRN

## 2021-09-27 MED ORDER — IRBESARTAN 75 MG PO TABS
75.0000 mg | ORAL_TABLET | Freq: Every day | ORAL | Status: DC
  Filled 2021-09-27: qty 1

## 2021-09-27 MED ORDER — INSULIN ASPART 100 UNIT/ML IJ SOLN
0.0000 [IU] | Freq: Every day | INTRAMUSCULAR | Status: DC
  Administered 2021-09-27: 3 [IU] via SUBCUTANEOUS

## 2021-09-27 MED ORDER — OXYBUTYNIN CHLORIDE ER 10 MG PO TB24
10.0000 mg | ORAL_TABLET | Freq: Every day | ORAL | Status: DC
  Administered 2021-09-27 – 2021-09-28 (×2): 10 mg via ORAL
  Filled 2021-09-27 (×2): qty 1

## 2021-09-27 MED ORDER — OXYCODONE-ACETAMINOPHEN 5-325 MG PO TABS: 1.0000 | ORAL_TABLET | ORAL | 0 refills | Status: DC | PRN

## 2021-09-27 MED ORDER — PHENYLEPHRINE 80 MCG/ML (10ML) SYRINGE FOR IV PUSH (FOR BLOOD PRESSURE SUPPORT)
PREFILLED_SYRINGE | INTRAVENOUS | Status: AC
  Filled 2021-09-27: qty 10

## 2021-09-27 MED ORDER — LACTATED RINGERS IV SOLN: INTRAVENOUS | Status: DC

## 2021-09-27 MED ORDER — SODIUM CHLORIDE 0.9 % IR SOLN
Status: DC | PRN
  Administered 2021-09-27: 30000 mL
  Administered 2021-09-27 (×2): 3000 mL

## 2021-09-27 MED ORDER — ONDANSETRON HCL 4 MG/2ML IJ SOLN
INTRAMUSCULAR | Status: DC | PRN
  Administered 2021-09-27: 4 mg via INTRAVENOUS

## 2021-09-27 MED ORDER — PHENYLEPHRINE 80 MCG/ML (10ML) SYRINGE FOR IV PUSH (FOR BLOOD PRESSURE SUPPORT)
PREFILLED_SYRINGE | INTRAVENOUS | Status: DC | PRN
  Administered 2021-09-27 (×3): 80 ug via INTRAVENOUS

## 2021-09-27 MED ORDER — ORAL CARE MOUTH RINSE: 15.0000 mL | Freq: Once | OROMUCOSAL | Status: AC

## 2021-09-27 MED ORDER — SODIUM CHLORIDE 0.45 % IV SOLN: INTRAVENOUS | Status: DC

## 2021-09-27 MED ORDER — DOCUSATE SODIUM 100 MG PO CAPS
100.0000 mg | ORAL_CAPSULE | Freq: Two times a day (BID) | ORAL | Status: DC
Start: 2021-09-27 — End: 2021-09-28
  Administered 2021-09-27 – 2021-09-28 (×2): 100 mg via ORAL
  Filled 2021-09-27 (×2): qty 1

## 2021-09-27 MED ORDER — SODIUM CHLORIDE 0.9% FLUSH: 3.0000 mL | INTRAVENOUS | Status: DC | PRN

## 2021-09-27 MED ORDER — HYDROMORPHONE HCL 1 MG/ML IJ SOLN
0.5000 mg | INTRAMUSCULAR | Status: DC | PRN
  Administered 2021-09-27 (×2): 1 mg via INTRAVENOUS
  Filled 2021-09-27 (×2): qty 1

## 2021-09-27 MED ORDER — DEXAMETHASONE SODIUM PHOSPHATE 10 MG/ML IJ SOLN
INTRAMUSCULAR | Status: DC | PRN
  Administered 2021-09-27: 8 mg via INTRAVENOUS

## 2021-09-27 MED ORDER — FENTANYL CITRATE PF 50 MCG/ML IJ SOSY: 25.0000 ug | PREFILLED_SYRINGE | INTRAMUSCULAR | Status: DC | PRN

## 2021-09-27 MED ORDER — ROSUVASTATIN CALCIUM 20 MG PO TABS
20.0000 mg | ORAL_TABLET | Freq: Every day | ORAL | Status: DC
  Administered 2021-09-27 – 2021-09-28 (×2): 20 mg via ORAL
  Filled 2021-09-27 (×2): qty 1

## 2021-09-27 MED ORDER — CEFAZOLIN SODIUM-DEXTROSE 2-4 GM/100ML-% IV SOLN
2.0000 g | Freq: Once | INTRAVENOUS | Status: AC
  Administered 2021-09-27: 2 g via INTRAVENOUS
  Filled 2021-09-27: qty 100

## 2021-09-27 MED ORDER — CHLORHEXIDINE GLUCONATE 0.12 % MT SOLN
15.0000 mL | Freq: Once | OROMUCOSAL | Status: AC
  Administered 2021-09-27: 15 mL via OROMUCOSAL

## 2021-09-27 MED ORDER — SODIUM CHLORIDE 0.9% FLUSH
3.0000 mL | Freq: Two times a day (BID) | INTRAVENOUS | Status: DC
  Administered 2021-09-27 (×2): 3 mL via INTRAVENOUS

## 2021-09-27 MED ORDER — INSULIN ASPART 100 UNIT/ML IJ SOLN
0.0000 [IU] | Freq: Three times a day (TID) | INTRAMUSCULAR | Status: DC
  Administered 2021-09-27: 5 [IU] via SUBCUTANEOUS
  Administered 2021-09-28 (×2): 8 [IU] via SUBCUTANEOUS

## 2021-09-27 MED ORDER — ONDANSETRON HCL 4 MG/2ML IJ SOLN
INTRAMUSCULAR | Status: AC
  Filled 2021-09-27: qty 2

## 2021-09-27 MED ORDER — FENTANYL CITRATE (PF) 100 MCG/2ML IJ SOLN
INTRAMUSCULAR | Status: AC
  Filled 2021-09-27: qty 2

## 2021-09-27 MED ORDER — DIPHENHYDRAMINE HCL 50 MG/ML IJ SOLN: 12.5000 mg | Freq: Four times a day (QID) | INTRAMUSCULAR | Status: DC | PRN

## 2021-09-27 MED ORDER — PROPOFOL 10 MG/ML IV BOLUS
INTRAVENOUS | Status: AC
Start: 2021-09-27 — End: ?
  Filled 2021-09-27: qty 20

## 2021-09-27 MED ORDER — OXYCODONE-ACETAMINOPHEN 5-325 MG PO TABS
1.0000 | ORAL_TABLET | ORAL | Status: DC | PRN
  Administered 2021-09-28 (×2): 2 via ORAL
  Filled 2021-09-27 (×2): qty 2

## 2021-09-27 MED ORDER — ACETAMINOPHEN 500 MG PO TABS
1000.0000 mg | ORAL_TABLET | Freq: Once | ORAL | Status: AC
  Administered 2021-09-27: 1000 mg via ORAL
  Filled 2021-09-27: qty 2

## 2021-09-27 MED ORDER — LISINOPRIL 10 MG PO TABS: 10.0000 mg | ORAL_TABLET | Freq: Every morning | ORAL | Status: DC

## 2021-09-27 MED ORDER — ACETAMINOPHEN 325 MG PO TABS: 650.0000 mg | ORAL_TABLET | ORAL | Status: DC | PRN

## 2021-09-27 MED ORDER — TAMSULOSIN HCL 0.4 MG PO CAPS
0.4000 mg | ORAL_CAPSULE | Freq: Every day | ORAL | Status: DC
  Administered 2021-09-27 – 2021-09-28 (×2): 0.4 mg via ORAL
  Filled 2021-09-27 (×2): qty 1

## 2021-09-27 MED ORDER — ONDANSETRON HCL 4 MG/2ML IJ SOLN: 4.0000 mg | INTRAMUSCULAR | Status: DC | PRN

## 2021-09-27 MED ORDER — LIDOCAINE 2% (20 MG/ML) 5 ML SYRINGE
INTRAMUSCULAR | Status: DC | PRN
  Administered 2021-09-27: 100 mg via INTRAVENOUS

## 2021-09-27 MED ORDER — SODIUM CHLORIDE 0.9 % IR SOLN
3000.0000 mL | Status: DC
  Administered 2021-09-27 (×2): 3000 mL

## 2021-09-27 MED ORDER — OXYCODONE HCL 5 MG/5ML PO SOLN: 5.0000 mg | Freq: Once | ORAL | Status: DC | PRN

## 2021-09-27 MED ORDER — DOCUSATE SODIUM 100 MG PO CAPS: 100.0000 mg | ORAL_CAPSULE | Freq: Every day | ORAL | 0 refills | Status: DC | PRN

## 2021-09-27 MED ORDER — SENNOSIDES-DOCUSATE SODIUM 8.6-50 MG PO TABS: 1.0000 | ORAL_TABLET | Freq: Every evening | ORAL | Status: DC | PRN

## 2021-09-27 MED ORDER — SODIUM CHLORIDE 0.9 % IV SOLN: 250.0000 mL | INTRAVENOUS | Status: DC | PRN

## 2021-09-27 MED ORDER — PROPOFOL 10 MG/ML IV BOLUS
INTRAVENOUS | Status: AC
  Filled 2021-09-27: qty 20

## 2021-09-27 MED ORDER — ZOLPIDEM TARTRATE 5 MG PO TABS: 5.0000 mg | ORAL_TABLET | Freq: Every evening | ORAL | Status: DC | PRN

## 2021-09-27 MED ORDER — FENTANYL CITRATE (PF) 100 MCG/2ML IJ SOLN
INTRAMUSCULAR | Status: DC | PRN
  Administered 2021-09-27 (×4): 25 ug via INTRAVENOUS

## 2021-09-27 MED ORDER — PROPOFOL 10 MG/ML IV BOLUS
INTRAVENOUS | Status: DC | PRN
  Administered 2021-09-27: 200 mg via INTRAVENOUS

## 2021-09-27 MED ORDER — DIPHENHYDRAMINE HCL 12.5 MG/5ML PO ELIX: 12.5000 mg | ORAL_SOLUTION | Freq: Four times a day (QID) | ORAL | Status: DC | PRN

## 2021-09-27 SURGICAL SUPPLY — 24 items
BAG DRN RND TRDRP ANRFLXCHMBR (UROLOGICAL SUPPLIES) ×1
BAG URINE DRAIN 2000ML AR STRL (UROLOGICAL SUPPLIES) ×2 IMPLANT
BAG URO CATCHER STRL LF (MISCELLANEOUS) ×2 IMPLANT
CATH FOLEY 3WAY 30CC 22FR (CATHETERS) ×1 IMPLANT
CATH FOLEY 3WAY 30CC 24FR (CATHETERS)
CATH URTH STD 24FR FL 3W 2 (CATHETERS) IMPLANT
DRAPE FOOT SWITCH (DRAPES) ×2 IMPLANT
ELECT REM PT RETURN 15FT ADLT (MISCELLANEOUS) IMPLANT
GLOVE SURG LX 7.5 STRW (GLOVE) ×1
GLOVE SURG LX STRL 7.5 STRW (GLOVE) ×1 IMPLANT
GOWN STRL REUS W/ TWL XL LVL3 (GOWN DISPOSABLE) ×1 IMPLANT
GOWN STRL REUS W/TWL XL LVL3 (GOWN DISPOSABLE) ×2
GUIDEWIRE STR DUAL SENSOR (WIRE) IMPLANT
HOLDER FOLEY CATH W/STRAP (MISCELLANEOUS) ×1 IMPLANT
IV CATH 14GX2 1/4 (CATHETERS) IMPLANT
KIT TURNOVER KIT A (KITS) ×1 IMPLANT
LOOP CUT BIPOLAR 24F LRG (ELECTROSURGICAL) ×2 IMPLANT
MANIFOLD NEPTUNE II (INSTRUMENTS) ×2 IMPLANT
PACK CYSTO (CUSTOM PROCEDURE TRAY) ×2 IMPLANT
SYR 30ML LL (SYRINGE) ×2 IMPLANT
SYR TOOMEY IRRIG 70ML (MISCELLANEOUS) ×2
SYRINGE TOOMEY IRRIG 70ML (MISCELLANEOUS) ×1 IMPLANT
TUBING CONNECTING 10 (TUBING) ×2 IMPLANT
TUBING UROLOGY SET (TUBING) ×2 IMPLANT

## 2021-09-27 NOTE — Transfer of Care (Signed)
Immediate Anesthesia Transfer of Care Note  Patient: Nathaniel Waters  Procedure(s) Performed: TRANSURETHRAL RESECTION OF THE PROSTATE (TURP), BIPOLAR (Prostate)  Patient Location: PACU  Anesthesia Type:General  Level of Consciousness: drowsy  Airway & Oxygen Therapy: Patient Spontanous Breathing and Patient connected to face mask oxygen  Post-op Assessment: Report given to RN and Post -op Vital signs reviewed and stable  Post vital signs: Reviewed and stable  Last Vitals:  Vitals Value Taken Time  BP 129/84 09/27/21 1346  Temp    Pulse 86 09/27/21 1348  Resp 23 09/27/21 1348  SpO2 96 % 09/27/21 1348  Vitals shown include unvalidated device data.  Last Pain:  Vitals:   09/27/21 1009  TempSrc: Oral  PainSc:          Complications: No notable events documented.

## 2021-09-27 NOTE — Anesthesia Postprocedure Evaluation (Signed)
Anesthesia Post Note  Patient: Nathaniel Waters  Procedure(s) Performed: TRANSURETHRAL RESECTION OF THE PROSTATE (TURP), BIPOLAR (Prostate)     Patient location during evaluation: PACU Anesthesia Type: General Level of consciousness: awake and alert Pain management: pain level controlled Vital Signs Assessment: post-procedure vital signs reviewed and stable Respiratory status: spontaneous breathing, nonlabored ventilation and respiratory function stable Cardiovascular status: blood pressure returned to baseline Postop Assessment: no apparent nausea or vomiting Anesthetic complications: no   No notable events documented.  Last Vitals:  Vitals:   09/27/21 1430 09/27/21 1445  BP: 127/81 (!) 134/92  Pulse: 73 71  Resp: (!) 22 12  Temp:  36.6 C  SpO2: 97% 97%    Last Pain:  Vitals:   09/27/21 1445  TempSrc:   PainSc: 3                  Shanda Howells

## 2021-09-28 ENCOUNTER — Encounter (HOSPITAL_COMMUNITY): Payer: Self-pay | Admitting: Urology

## 2021-09-28 DIAGNOSIS — Z79899 Other long term (current) drug therapy: Secondary | ICD-10-CM | POA: Diagnosis not present

## 2021-09-28 DIAGNOSIS — I10 Essential (primary) hypertension: Secondary | ICD-10-CM | POA: Diagnosis not present

## 2021-09-28 DIAGNOSIS — K219 Gastro-esophageal reflux disease without esophagitis: Secondary | ICD-10-CM | POA: Diagnosis not present

## 2021-09-28 DIAGNOSIS — N401 Enlarged prostate with lower urinary tract symptoms: Secondary | ICD-10-CM | POA: Diagnosis not present

## 2021-09-28 DIAGNOSIS — Z8249 Family history of ischemic heart disease and other diseases of the circulatory system: Secondary | ICD-10-CM | POA: Diagnosis not present

## 2021-09-28 DIAGNOSIS — E119 Type 2 diabetes mellitus without complications: Secondary | ICD-10-CM | POA: Diagnosis not present

## 2021-09-28 DIAGNOSIS — Z8619 Personal history of other infectious and parasitic diseases: Secondary | ICD-10-CM | POA: Diagnosis not present

## 2021-09-28 DIAGNOSIS — E785 Hyperlipidemia, unspecified: Secondary | ICD-10-CM | POA: Diagnosis not present

## 2021-09-28 DIAGNOSIS — N138 Other obstructive and reflux uropathy: Secondary | ICD-10-CM | POA: Diagnosis not present

## 2021-09-28 DIAGNOSIS — R338 Other retention of urine: Secondary | ICD-10-CM | POA: Diagnosis not present

## 2021-09-28 DIAGNOSIS — F17210 Nicotine dependence, cigarettes, uncomplicated: Secondary | ICD-10-CM | POA: Diagnosis not present

## 2021-09-28 DIAGNOSIS — Z7984 Long term (current) use of oral hypoglycemic drugs: Secondary | ICD-10-CM | POA: Diagnosis not present

## 2021-09-28 LAB — CBC
HCT: 43.4 % (ref 39.0–52.0)
Hemoglobin: 14.5 g/dL (ref 13.0–17.0)
MCH: 30.8 pg (ref 26.0–34.0)
MCHC: 33.4 g/dL (ref 30.0–36.0)
MCV: 92.1 fL (ref 80.0–100.0)
Platelets: 201 10*3/uL (ref 150–400)
RBC: 4.71 MIL/uL (ref 4.22–5.81)
RDW: 12.5 % (ref 11.5–15.5)
WBC: 13.2 10*3/uL — ABNORMAL HIGH (ref 4.0–10.5)
nRBC: 0 % (ref 0.0–0.2)

## 2021-09-28 LAB — GLUCOSE, CAPILLARY
Glucose-Capillary: 270 mg/dL — ABNORMAL HIGH (ref 70–99)
Glucose-Capillary: 252 mg/dL — ABNORMAL HIGH (ref 70–99)

## 2021-09-28 LAB — BASIC METABOLIC PANEL
Anion gap: 8 (ref 5–15)
BUN: 20 mg/dL (ref 8–23)
CO2: 21 mmol/L — ABNORMAL LOW (ref 22–32)
Calcium: 8.8 mg/dL — ABNORMAL LOW (ref 8.9–10.3)
Chloride: 107 mmol/L (ref 98–111)
Creatinine, Ser: 1.1 mg/dL (ref 0.61–1.24)
GFR, Estimated: >60 mL/min (ref >60)
Glucose, Bld: 216 mg/dL — ABNORMAL HIGH (ref 70–99)
Potassium: 4.3 mmol/L (ref 3.5–5.1)
Sodium: 136 mmol/L (ref 135–145)

## 2021-10-01 LAB — SURGICAL PATHOLOGY

## 2021-10-08 ENCOUNTER — Other Ambulatory Visit: Payer: Self-pay | Admitting: Family

## 2021-10-08 DIAGNOSIS — I10 Essential (primary) hypertension: Secondary | ICD-10-CM

## 2021-10-31 DIAGNOSIS — R338 Other retention of urine: Secondary | ICD-10-CM | POA: Diagnosis not present

## 2021-11-18 ENCOUNTER — Encounter: Payer: Self-pay | Admitting: Emergency Medicine

## 2021-11-18 ENCOUNTER — Ambulatory Visit (INDEPENDENT_AMBULATORY_CARE_PROVIDER_SITE_OTHER): Payer: Medicare Other | Admitting: Emergency Medicine

## 2021-11-18 VITALS — BP 127/70 | HR 83 | Temp 97.9°F | Ht 71.0 in | Wt 218.0 lb

## 2021-11-18 DIAGNOSIS — I152 Hypertension secondary to endocrine disorders: Secondary | ICD-10-CM | POA: Diagnosis not present

## 2021-11-18 DIAGNOSIS — E785 Hyperlipidemia, unspecified: Secondary | ICD-10-CM

## 2021-11-18 DIAGNOSIS — E1169 Type 2 diabetes mellitus with other specified complication: Secondary | ICD-10-CM

## 2021-11-18 DIAGNOSIS — F17200 Nicotine dependence, unspecified, uncomplicated: Secondary | ICD-10-CM

## 2021-11-18 DIAGNOSIS — E1159 Type 2 diabetes mellitus with other circulatory complications: Secondary | ICD-10-CM | POA: Diagnosis not present

## 2021-11-18 NOTE — Patient Instructions (Signed)
Hypertension, Adult High blood pressure (hypertension) is when the force of blood pumping through the arteries is too strong. The arteries are the blood vessels that carry blood from the heart throughout the body. Hypertension forces the heart to work harder to pump blood and may cause arteries to become narrow or stiff. Untreated or uncontrolled hypertension can lead to a heart attack, heart failure, a stroke, kidney disease, and other problems. A blood pressure reading consists of a higher number over a lower number. Ideally, your blood pressure should be below 120/80. The first ("top") number is called the systolic pressure. It is a measure of the pressure in your arteries as your heart beats. The second ("bottom") number is called the diastolic pressure. It is a measure of the pressure in your arteries as the heart relaxes. What are the causes? The exact cause of this condition is not known. There are some conditions that result in high blood pressure. What increases the risk? Certain factors may make you more likely to develop high blood pressure. Some of these risk factors are under your control, including: Smoking. Not getting enough exercise or physical activity. Being overweight. Having too much fat, sugar, calories, or salt (sodium) in your diet. Drinking too much alcohol. Other risk factors include: Having a personal history of heart disease, diabetes, high cholesterol, or kidney disease. Stress. Having a family history of high blood pressure and high cholesterol. Having obstructive sleep apnea. Age. The risk increases with age. What are the signs or symptoms? High blood pressure may not cause symptoms. Very high blood pressure (hypertensive crisis) may cause: Headache. Fast or irregular heartbeats (palpitations). Shortness of breath. Nosebleed. Nausea and vomiting. Vision changes. Severe chest pain, dizziness, and seizures. How is this diagnosed? This condition is diagnosed by  measuring your blood pressure while you are seated, with your arm resting on a flat surface, your legs uncrossed, and your feet flat on the floor. The cuff of the blood pressure monitor will be placed directly against the skin of your upper arm at the level of your heart. Blood pressure should be measured at least twice using the same arm. Certain conditions can cause a difference in blood pressure between your right and left arms. If you have a high blood pressure reading during one visit or you have normal blood pressure with other risk factors, you may be asked to: Return on a different day to have your blood pressure checked again. Monitor your blood pressure at home for 1 week or longer. If you are diagnosed with hypertension, you may have other blood or imaging tests to help your health care provider understand your overall risk for other conditions. How is this treated? This condition is treated by making healthy lifestyle changes, such as eating healthy foods, exercising more, and reducing your alcohol intake. You may be referred for counseling on a healthy diet and physical activity. Your health care provider may prescribe medicine if lifestyle changes are not enough to get your blood pressure under control and if: Your systolic blood pressure is above 130. Your diastolic blood pressure is above 80. Your personal target blood pressure may vary depending on your medical conditions, your age, and other factors. Follow these instructions at home: Eating and drinking  Eat a diet that is high in fiber and potassium, and low in sodium, added sugar, and fat. An example of this eating plan is called the DASH diet. DASH stands for Dietary Approaches to Stop Hypertension. To eat this way: Eat   plenty of fresh fruits and vegetables. Try to fill one half of your plate at each meal with fruits and vegetables. Eat whole grains, such as whole-wheat pasta, brown rice, or whole-grain bread. Fill about one  fourth of your plate with whole grains. Eat or drink low-fat dairy products, such as skim milk or low-fat yogurt. Avoid fatty cuts of meat, processed or cured meats, and poultry with skin. Fill about one fourth of your plate with lean proteins, such as fish, chicken without skin, beans, eggs, or tofu. Avoid pre-made and processed foods. These tend to be higher in sodium, added sugar, and fat. Reduce your daily sodium intake. Many people with hypertension should eat less than 1,500 mg of sodium a day. Do not drink alcohol if: Your health care provider tells you not to drink. You are pregnant, may be pregnant, or are planning to become pregnant. If you drink alcohol: Limit how much you have to: 0-1 drink a day for women. 0-2 drinks a day for men. Know how much alcohol is in your drink. In the [place] one drink equals one 12 oz bottle of beer (355 mL), one 5 oz glass of wine (148 mL), or one 1 oz glass of hard liquor (44 mL). Lifestyle  Work with your health care provider to maintain a healthy body weight or to lose weight. Ask what an ideal weight is for you. Get at least 30 minutes of exercise that causes your heart to beat faster (aerobic exercise) most days of the week. Activities may include walking, swimming, or biking. Include exercise to strengthen your muscles (resistance exercise), such as Pilates or lifting weights, as part of your weekly exercise routine. Try to do these types of exercises for 30 minutes at least 3 days a week. Do not use any products that contain nicotine or tobacco. These products include cigarettes, chewing tobacco, and vaping devices, such as e-cigarettes. If you need help quitting, ask your health care provider. Monitor your blood pressure at home as told by your health care provider. Keep all follow-up visits. This is important. Medicines Take over-the-counter and prescription medicines only as told by your health care provider. Follow directions carefully. Blood  pressure medicines must be taken as prescribed. Do not skip doses of blood pressure medicine. Doing this puts you at risk for problems and can make the medicine less effective. Ask your health care provider about side effects or reactions to medicines that you should watch for. Contact a health care provider if you: Think you are having a reaction to a medicine you are taking. Have headaches that keep coming back (recurring). Feel dizzy. Have swelling in your ankles. Have trouble with your vision. Get help right away if you: Develop a severe headache or confusion. Have unusual weakness or numbness. Feel faint. Have severe pain in your chest or abdomen. Vomit repeatedly. Have trouble breathing. These symptoms may be an emergency. Get help right away. Call 911. Do not wait to see if the symptoms will go away. Do not drive yourself to the hospital. Summary Hypertension is when the force of blood pumping through your arteries is too strong. If this condition is not controlled, it may put you at risk for serious complications. Your personal target blood pressure may vary depending on your medical conditions, your age, and other factors. For most people, a normal blood pressure is less than 120/80. Hypertension is treated with lifestyle changes, medicines, or a combination of both. Lifestyle changes include losing weight, eating a healthy,   low-sodium diet, exercising more, and limiting alcohol. This information is not intended to replace advice given to you by your health care provider. Make sure you discuss any questions you have with your health care provider. Document Revised: 01/29/2021 Document Reviewed: 01/29/2021 Elsevier Patient Education  2023 Elsevier Inc.  

## 2021-11-18 NOTE — Progress Notes (Signed)
Nathaniel Waters 66 y.o.   Chief Complaint  Patient presents with   Follow-up    2 month f/u for BP, states when he first got up this morning it was 127/70    HISTORY OF PRESENT ILLNESS: This is a 66 y.o. male here for follow-up of hypertension. On valsartan 80 mg daily Normal daily blood pressure readings at home Also has diabetes and dyslipidemia.  Morning glucose between 100- 140. Takes glipizide and Farxiga along with rosuvastatin Lab Results  Component Value Date   HGBA1C 8.3 (H) 09/25/2021   BP Readings from Last 3 Encounters:  11/18/21 (!) 144/88  09/28/21 136/66  09/25/21 (!) 144/77     HPI   Prior to Admission medications   Medication Sig Start Date End Date Taking? Authorizing Provider  ACCU-CHEK GUIDE test strip TEST UP TO 4 TIMES A DAY 09/04/21  Yes Marrian Salvage, FNP  blood glucose meter kit and supplies KIT Dispense based on patient and insurance preference. Use up to four times daily as directed. (FOR ICD-9 250.00, 250.01). 04/24/20  Yes Marrian Salvage, FNP  Blood Glucose Monitoring Suppl (ACCU-CHEK AVIVA PLUS) w/Device KIT Use to check blood sugars twice a day 05/12/17  Yes Dragnev, Caesar Chestnut, NP  dapagliflozin propanediol (FARXIGA) 10 MG TABS tablet Take 1 tablet (10 mg total) by mouth daily. 09/18/21  Yes Teasha Murrillo, Ines Bloomer, MD  docusate sodium (COLACE) 100 MG capsule Take 1 capsule (100 mg total) by mouth daily as needed for up to 30 doses. 09/27/21  Yes Janith Lima, MD  finasteride (PROSCAR) 5 MG tablet Take 5 mg by mouth in the morning.   Yes [provider]  glipiZIDE (GLUCOTROL XL) 5 MG 24 hr tablet Take 1 tablet (5 mg total) by mouth daily with breakfast. 07/09/21  Yes Marrian Salvage, FNP  Lancets (ACCU-CHEK MULTICLIX) lancets Use to help check blood sugars twice a day 05/12/17  Yes Dragnev, Caesar Chestnut, NP  oxyCODONE-acetaminophen (PERCOCET) 5-325 MG tablet Take 1 tablet by mouth every 4 (four) hours as needed for  up to 18 doses for severe pain. 09/27/21  Yes Janith Lima, MD  rosuvastatin (CRESTOR) 20 MG tablet Take 1 tablet (20 mg total) by mouth daily. 09/18/21  Yes Lea Baine, Ines Bloomer, MD  tamsulosin (FLOMAX) 0.4 MG CAPS capsule Take 1 capsule (0.4 mg total) by mouth daily. Patient taking differently: Take 0.4 mg by mouth every evening. 07/09/21  Yes Marrian Salvage, FNP  Tetrahydroz-Dextran-PEG-Povid [facility] ADVANCED RELIEF) 0.05-0.1-1-1 % SOLN Place 1-2 drops into both eyes 3 (three) times daily as needed (itchy/red/irritated eyes.).   Yes [provider]  valsartan (DIOVAN) 80 MG tablet Take 1 tablet (80 mg total) by mouth daily. 09/18/21  Yes Horald Pollen, MD    Allergies  Allergen Reactions   Metformin And Related Diarrhea   Jardiance [Empagliflozin] Rash    Patient Active Problem List   Diagnosis Date Noted   BPH (benign prostatic hyperplasia) 09/27/2021   BPH with obstruction/lower urinary tract symptoms 09/18/2021   Chronic hepatitis C without hepatic coma ([place]) 04/11/2019   Hypertension associated with diabetes ([place]) 04/09/2017   Cigarette nicotine dependence with nicotine-induced disorder 04/09/2017   Dyslipidemia associated with type 2 diabetes mellitus ([place]) 10/23/2014   Dyslipidemia 09/22/2014   Elevated PSA 09/22/2014   GERD 12/30/2007   Type II diabetes mellitus, uncontrolled 12/29/2007   TUBULOVILLOUS ADENOMA, COLON, HX OF 12/29/2007    Past Medical History:  Diagnosis Date   Diabetes mellitus  Diabetes mellitus without complication (HCC)    GERD (gastroesophageal reflux disease)    Hyperlipidemia    Hypertension    Pneumonia     Past Surgical History:  Procedure Laterality Date   CARPAL TUNNEL RELEASE     both    COLONOSCOPY  2018   TRANSURETHRAL RESECTION OF PROSTATE N/A 09/27/2021   Procedure: TRANSURETHRAL RESECTION OF THE PROSTATE (TURP), BIPOLAR;  Surgeon: Janith Lima, MD;  Location: [facility];  Service: Urology;  Laterality: N/A;     Social History   Socioeconomic History   Marital status: Legally Separated    Spouse name: Not on file   Number of children: 4   Years of education: 11   Highest education level: Not on file  Occupational History   Occupation: [employer]  Tobacco Use   Smoking status: Every Day    Packs/day: 0.25    Types: Cigarettes   Smokeless tobacco: Never   Tobacco comments:    Actively in the process of quitting  Vaping Use   Vaping Use: Never used  Substance and Sexual Activity   Alcohol use: Yes    Comment: occasionally on weekend   Drug use: No   Sexual activity: Not on file  Other Topics Concern   Not on file  Social History Narrative   ** Merged History Encounter **       Fun: Listen to music and be around family and friends. Denies religious beliefs effecting health care.    Social Determinants of Health   Financial Resource Strain: Not on file  Food Insecurity: Not on file  Transportation Needs: Not on file  Physical Activity: Not on file  Stress: Not on file  Social Connections: Not on file  Intimate Partner Violence: Not on file    Family History  Problem Relation Age of Onset   Breast cancer Mother    Heart disease Brother    Lung cancer Father    Healthy Paternal Grandmother    Cancer Brother    Colon cancer Neg Hx    Esophageal cancer Neg Hx    Stomach cancer Neg Hx    Rectal cancer Neg Hx    Colon polyps Neg Hx      Review of Systems  Constitutional: Negative.  Negative for chills and fever.  HENT: Negative.  Negative for congestion and sore throat.   Respiratory: Negative.  Negative for cough and shortness of breath.   Cardiovascular: Negative.  Negative for chest pain and palpitations.  Gastrointestinal:  Negative for abdominal pain, diarrhea, nausea and vomiting.  Genitourinary: Negative.   Musculoskeletal: Negative.   Skin: Negative.  Negative for rash.  Neurological: Negative.  Negative for dizziness and headaches.  All other systems  reviewed and are negative.  Today's Vitals   11/18/21 0825 11/18/21 0855  BP: (!) 144/88 127/70  Pulse: 83   Temp: 97.9 F (36.6 C)   TempSrc: Temporal   SpO2: 98%   Weight: 218 lb (98.9 kg)   Height: _0  (1.803 m)    Body mass index is 30.4 kg/m.   Physical Exam Vitals reviewed.  Constitutional:      Appearance: Normal appearance.  HENT:     Head: Normocephalic.     Mouth/Throat:     Mouth: Mucous membranes are moist.     Pharynx: Oropharynx is clear.  Eyes:     Extraocular Movements: Extraocular movements intact.     Conjunctiva/sclera: Conjunctivae normal.     Pupils: Pupils are equal,  round, and reactive to light.  Cardiovascular:     Rate and Rhythm: Normal rate and regular rhythm.     Pulses: Normal pulses.     Heart sounds: Normal heart sounds.  Pulmonary:     Effort: Pulmonary effort is normal.     Breath sounds: Normal breath sounds.  Musculoskeletal:        General: Normal range of motion.     Cervical back: No tenderness.  Lymphadenopathy:     Cervical: No cervical adenopathy.  Skin:    General: Skin is warm and dry.     Capillary Refill: Capillary refill takes less than 2 seconds.  Neurological:     General: No focal deficit present.     Mental Status: He is alert and oriented to person, place, and time.      ASSESSMENT & PLAN: A total of 49 minutes was spent with the patient and counseling/coordination of care regarding preparing for this visit, review of most recent office visit notes, review of multiple chronic medical problems under management, review of all medications, review of most recent blood work results, cardiovascular risks associated with hypertension and diabetes, education on nutrition, prognosis, documentation, need for follow-up.  Problem List Items Addressed This Visit       Cardiovascular and Mediastinum   Hypertension associated with diabetes ([place]) - Primary    Well-controlled hypertension.  Normal blood pressure readings  at home. Continue valsartan 80 mg daily. Well-controlled diabetes with normal blood sugars numbers at home. Continue glipizide XL 5 mg daily and Farxiga 10 mg daily. Diet and nutrition discussed. Card vascular risk associated with hypertension and dyslipidemia discussed. Follow-up in 3 months.        Endocrine   Dyslipidemia associated with type 2 diabetes mellitus ([place])    Stable.  Continue rosuvastatin 20 mg daily. Diet and nutrition discussed. The 10-year ASCVD risk score (Arnett DK, et al., 2019) is: 50.5%   Values used to calculate the score:     Age: 58 years     Sex: Male     Is Non-Hispanic African American: Yes     Diabetic: Yes     Tobacco smoker: Yes     Systolic Blood Pressure: 161 mmHg     Is BP treated: Yes     HDL Cholesterol: 40.7 mg/dL     Total Cholesterol: 286 mg/dL         Other   Current smoker    Cardiovascular and cancer risk associated with smoking discussed.  Smoking cessation advice given.      Patient Instructions  Hypertension, Adult High blood pressure (hypertension) is when the force of blood pumping through the arteries is too strong. The arteries are the blood vessels that carry blood from the heart throughout the body. Hypertension forces the heart to work harder to pump blood and may cause arteries to become narrow or stiff. Untreated or uncontrolled hypertension can lead to a heart attack, heart failure, a stroke, kidney disease, and other problems. A blood pressure reading consists of a higher number over a lower number. Ideally, your blood pressure should be below 120/80. The first ("top") number is called the systolic pressure. It is a measure of the pressure in your arteries as your heart beats. The second ("bottom") number is called the diastolic pressure. It is a measure of the pressure in your arteries as the heart relaxes. What are the causes? The exact cause of this condition is not known. There are some conditions that  result in  high blood pressure. What increases the risk? Certain factors may make you more likely to develop high blood pressure. Some of these risk factors are under your control, including: Smoking. Not getting enough exercise or physical activity. Being overweight. Having too much fat, sugar, calories, or salt (sodium) in your diet. Drinking too much alcohol. Other risk factors include: Having a personal history of heart disease, diabetes, high cholesterol, or kidney disease. Stress. Having a family history of high blood pressure and high cholesterol. Having obstructive sleep apnea. Age. The risk increases with age. What are the signs or symptoms? High blood pressure may not cause symptoms. Very high blood pressure (hypertensive crisis) may cause: Headache. Fast or irregular heartbeats (palpitations). Shortness of breath. Nosebleed. Nausea and vomiting. Vision changes. Severe chest pain, dizziness, and seizures. How is this diagnosed? This condition is diagnosed by measuring your blood pressure while you are seated, with your arm resting on a flat surface, your legs uncrossed, and your feet flat on the floor. The cuff of the blood pressure monitor will be placed directly against the skin of your upper arm at the level of your heart. Blood pressure should be measured at least twice using the same arm. Certain conditions can cause a difference in blood pressure between your right and left arms. If you have a high blood pressure reading during one visit or you have normal blood pressure with other risk factors, you may be asked to: Return on a different day to have your blood pressure checked again. Monitor your blood pressure at home for 1 week or longer. If you are diagnosed with hypertension, you may have other blood or imaging tests to help your health care provider understand your overall risk for other conditions. How is this treated? This condition is treated by making healthy lifestyle  changes, such as eating healthy foods, exercising more, and reducing your alcohol intake. You may be referred for counseling on a healthy diet and physical activity. Your health care provider may prescribe medicine if lifestyle changes are not enough to get your blood pressure under control and if: Your systolic blood pressure is above 130. Your diastolic blood pressure is above 80. Your personal target blood pressure may vary depending on your medical conditions, your age, and other factors. Follow these instructions at home: Eating and drinking  Eat a diet that is high in fiber and potassium, and low in sodium, added sugar, and fat. An example of this eating plan is called the DASH diet. DASH stands for Dietary Approaches to Stop Hypertension. To eat this way: Eat plenty of fresh fruits and vegetables. Try to fill one half of your plate at each meal with fruits and vegetables. Eat whole grains, such as whole-wheat pasta, brown rice, or whole-grain bread. Fill about one fourth of your plate with whole grains. Eat or drink low-fat dairy products, such as skim milk or low-fat yogurt. Avoid fatty cuts of meat, processed or cured meats, and poultry with skin. Fill about one fourth of your plate with lean proteins, such as fish, chicken without skin, beans, eggs, or tofu. Avoid pre-made and processed foods. These tend to be higher in sodium, added sugar, and fat. Reduce your daily sodium intake. Many people with hypertension should eat less than 1,500 mg of sodium a day. Do not drink alcohol if: Your health care provider tells you not to drink. You are pregnant, may be pregnant, or are planning to become pregnant. If you drink alcohol: Limit how  much you have to: 0-1 drink a day for women. 0-2 drinks a day for men. Know how much alcohol is in your drink. In the [place] one drink equals one 12 oz bottle of beer (355 mL), one 5 oz glass of wine (148 mL), or one 1 oz glass of hard liquor (44  mL). Lifestyle  Work with your health care provider to maintain a healthy body weight or to lose weight. Ask what an ideal weight is for you. Get at least 30 minutes of exercise that causes your heart to beat faster (aerobic exercise) most days of the week. Activities may include walking, swimming, or biking. Include exercise to strengthen your muscles (resistance exercise), such as Pilates or lifting weights, as part of your weekly exercise routine. Try to do these types of exercises for 30 minutes at least 3 days a week. Do not use any products that contain nicotine or tobacco. These products include cigarettes, chewing tobacco, and vaping devices, such as e-cigarettes. If you need help quitting, ask your health care provider. Monitor your blood pressure at home as told by your health care provider. Keep all follow-up visits. This is important. Medicines Take over-the-counter and prescription medicines only as told by your health care provider. Follow directions carefully. Blood pressure medicines must be taken as prescribed. Do not skip doses of blood pressure medicine. Doing this puts you at risk for problems and can make the medicine less effective. Ask your health care provider about side effects or reactions to medicines that you should watch for. Contact a health care provider if you: Think you are having a reaction to a medicine you are taking. Have headaches that keep coming back (recurring). Feel dizzy. Have swelling in your ankles. Have trouble with your vision. Get help right away if you: Develop a severe headache or confusion. Have unusual weakness or numbness. Feel faint. Have severe pain in your chest or abdomen. Vomit repeatedly. Have trouble breathing. These symptoms may be an emergency. Get help right away. Call 911. Do not wait to see if the symptoms will go away. Do not drive yourself to the hospital. Summary Hypertension is when the force of blood pumping through  your arteries is too strong. If this condition is not controlled, it may put you at risk for serious complications. Your personal target blood pressure may vary depending on your medical conditions, your age, and other factors. For most people, a normal blood pressure is less than 120/80. Hypertension is treated with lifestyle changes, medicines, or a combination of both. Lifestyle changes include losing weight, eating a healthy, low-sodium diet, exercising more, and limiting alcohol. This information is not intended to replace advice given to you by your health care provider. Make sure you discuss any questions you have with your health care provider. Document Revised: 01/29/2021 Document Reviewed: 01/29/2021 Elsevier Patient Education  [place], MD [facility]

## 2021-11-18 NOTE — Assessment & Plan Note (Signed)
Cardiovascular and cancer risk associated with smoking discussed. Smoking cessation advice given. 

## 2021-11-18 NOTE — Assessment & Plan Note (Signed)
Stable.  Continue rosuvastatin 20 mg daily. Diet and nutrition discussed. The 10-year ASCVD risk score (Arnett DK, et al., 2019) is: 50.5%   Values used to calculate the score:     Age: 66 years     Sex: Male     Is Non-Hispanic African American: Yes     Diabetic: Yes     Tobacco smoker: Yes     Systolic Blood Pressure: 027 mmHg     Is BP treated: Yes     HDL Cholesterol: 40.7 mg/dL     Total Cholesterol: 286 mg/dL

## 2021-11-18 NOTE — Assessment & Plan Note (Signed)
Well-controlled hypertension.  Normal blood pressure readings at home. Continue valsartan 80 mg daily. Well-controlled diabetes with normal blood sugars numbers at home. Continue glipizide XL 5 mg daily and Farxiga 10 mg daily. Diet and nutrition discussed. Card vascular risk associated with hypertension and dyslipidemia discussed. Follow-up in 3 months.

## 2021-12-08 ENCOUNTER — Other Ambulatory Visit: Payer: Self-pay | Admitting: Family

## 2021-12-08 DIAGNOSIS — I10 Essential (primary) hypertension: Secondary | ICD-10-CM

## 2022-01-03 ENCOUNTER — Other Ambulatory Visit: Payer: Self-pay | Admitting: Family

## 2022-01-03 DIAGNOSIS — I10 Essential (primary) hypertension: Secondary | ICD-10-CM

## 2022-01-06 ENCOUNTER — Other Ambulatory Visit: Payer: Self-pay | Admitting: Family

## 2022-01-06 DIAGNOSIS — I10 Essential (primary) hypertension: Secondary | ICD-10-CM

## 2022-01-07 ENCOUNTER — Telehealth: Payer: Self-pay | Admitting: Emergency Medicine

## 2022-01-07 MED ORDER — GLIPIZIDE ER 5 MG PO TB24
5.0000 mg | ORAL_TABLET | Freq: Every day | ORAL | 1 refills | Status: DC
Start: 2022-01-07 — End: 2022-04-13

## 2022-01-07 NOTE — Telephone Encounter (Signed)
Patient needs a refill on his glipizide - please send to [facility] [address] - patient states that he only has 2 pills left.

## 2022-01-07 NOTE — Telephone Encounter (Signed)
Called patient informed him that his medication was sent to his pharmacy

## 2022-01-14 DIAGNOSIS — H02823 Cysts of right eye, unspecified eyelid: Secondary | ICD-10-CM | POA: Diagnosis not present

## 2022-01-14 DIAGNOSIS — H02826 Cysts of left eye, unspecified eyelid: Secondary | ICD-10-CM | POA: Diagnosis not present

## 2022-01-28 DIAGNOSIS — R350 Frequency of micturition: Secondary | ICD-10-CM | POA: Diagnosis not present

## 2022-04-12 ENCOUNTER — Other Ambulatory Visit: Payer: Self-pay | Admitting: Family

## 2022-04-12 ENCOUNTER — Other Ambulatory Visit: Payer: Self-pay | Admitting: Emergency Medicine

## 2022-04-12 DIAGNOSIS — I10 Essential (primary) hypertension: Secondary | ICD-10-CM

## 2022-04-15 ENCOUNTER — Other Ambulatory Visit: Payer: Self-pay | Admitting: Family

## 2022-04-15 DIAGNOSIS — E1159 Type 2 diabetes mellitus with other circulatory complications: Secondary | ICD-10-CM

## 2022-04-24 ENCOUNTER — Telehealth: Payer: Self-pay | Admitting: Emergency Medicine

## 2022-04-24 NOTE — Telephone Encounter (Signed)
Caller & Relationship to patient:  patient   Call back number:[phone]   Date of last office visit:  11/2021   Date of next office visit: 05/06/2022   Medication(s) to be refilled:  Farxiga 10 mg.        Preferred [facility]acy:  [facility] [address]

## 2022-04-25 ENCOUNTER — Other Ambulatory Visit: Payer: Self-pay

## 2022-04-25 DIAGNOSIS — I152 Hypertension secondary to endocrine disorders: Secondary | ICD-10-CM

## 2022-04-25 MED ORDER — DAPAGLIFLOZIN PROPANEDIOL 10 MG PO TABS: 10.0000 mg | ORAL_TABLET | Freq: Every day | ORAL | 1 refills | Status: DC

## 2022-04-25 NOTE — Telephone Encounter (Signed)
Medication send to pt prefer pharmacy [facility]

## 2022-05-06 ENCOUNTER — Encounter: Payer: Self-pay | Admitting: Emergency Medicine

## 2022-05-06 ENCOUNTER — Ambulatory Visit (INDEPENDENT_AMBULATORY_CARE_PROVIDER_SITE_OTHER): Payer: Medicare Other | Admitting: Emergency Medicine

## 2022-05-06 VITALS — BP 124/80 | HR 88 | Temp 98.5°F | Ht 71.0 in | Wt 234.0 lb

## 2022-05-06 DIAGNOSIS — N138 Other obstructive and reflux uropathy: Secondary | ICD-10-CM | POA: Diagnosis not present

## 2022-05-06 DIAGNOSIS — E1159 Type 2 diabetes mellitus with other circulatory complications: Secondary | ICD-10-CM | POA: Diagnosis not present

## 2022-05-06 DIAGNOSIS — F17200 Nicotine dependence, unspecified, uncomplicated: Secondary | ICD-10-CM

## 2022-05-06 DIAGNOSIS — N401 Enlarged prostate with lower urinary tract symptoms: Secondary | ICD-10-CM | POA: Diagnosis not present

## 2022-05-06 DIAGNOSIS — E785 Hyperlipidemia, unspecified: Secondary | ICD-10-CM | POA: Diagnosis not present

## 2022-05-06 DIAGNOSIS — I152 Hypertension secondary to endocrine disorders: Secondary | ICD-10-CM | POA: Diagnosis not present

## 2022-05-06 DIAGNOSIS — E1169 Type 2 diabetes mellitus with other specified complication: Secondary | ICD-10-CM

## 2022-05-06 DIAGNOSIS — F17219 Nicotine dependence, cigarettes, with unspecified nicotine-induced disorders: Secondary | ICD-10-CM | POA: Diagnosis not present

## 2022-05-06 LAB — POCT GLYCOSYLATED HEMOGLOBIN (HGB A1C): Hemoglobin A1C: 7.4 % — AB (ref 4.0–5.6)

## 2022-05-06 NOTE — Assessment & Plan Note (Signed)
Stable.  Diet and nutrition discussed. Cardiovascular risk associated with dyslipidemia discussed. Continue rosuvastatin 20 mg daily. The 10-year ASCVD risk score (Arnett DK, et al., 2019) is: 50.2%   Values used to calculate the score:     Age: 67 years     Sex: Male     Is Non-Hispanic African American: Yes     Diabetic: Yes     Tobacco smoker: Yes     Systolic Blood Pressure: 438 mmHg     Is BP treated: Yes     HDL Cholesterol: 40.7 mg/dL     Total Cholesterol: 286 mg/dL

## 2022-05-06 NOTE — Assessment & Plan Note (Addendum)
Elevated blood pressure reading in the office but normal readings at home. Advised to continue monitoring blood pressure readings daily for the next couple weeks and keep a log.  Advised to contact the office if numbers persistently abnormal In the meantime continue valsartan 80 mg daily Cardiovascular risk associated with uncontrolled hypertension discussed Hemoglobin A1c at 7.4 better than before Continue Farxiga 10 mg daily and glipizide 5 mg daily Diet and nutrition discussed Follow-up in 3 months. Dietary approaches to stop hypertension discussed.

## 2022-05-06 NOTE — Assessment & Plan Note (Signed)
Symptoms much improved.  Continues Flomax 0.4 mg at bedtime along with Proscar 5 mg daily

## 2022-05-06 NOTE — Patient Instructions (Signed)

## 2022-05-06 NOTE — Assessment & Plan Note (Signed)
Cardiovascular and cancer risks associated with smoking discussed. °Smoking cessation advice given. °

## 2022-05-06 NOTE — Progress Notes (Signed)
Nathaniel Waters 67 y.o.   Chief Complaint  Patient presents with   Follow-up    82mth f/u appt, no concerns     HISTORY OF PRESENT ILLNESS: This is a 67y.o. male here for 331-monthollow-up of chronic medical conditions including diabetes, hypertension, and dyslipidemia. Doing well.  Has no complaints or medical concerns today. Blood pressure this morning at home 124/80  HPI   Prior to Admission medications   Medication Sig Start Date End Date Taking? Authorizing Provider  ACCU-CHEK GUIDE test strip TEST UP TO 4 TIMES A DAY 04/14/22  Yes MuMarrian SalvageFNP  blood glucose meter kit and supplies KIT Dispense based on patient and insurance preference. Use up to four times daily as directed. (FOR ICD-9 250.00, 250.01). 04/24/20  Yes MuMarrian SalvageFNP  Blood Glucose Monitoring Suppl (ACCU-CHEK AVIVA PLUS) w/Device KIT Use to check blood sugars twice a day 05/12/17  Yes Dragnev, AsCaesar ChestnutNP  dapagliflozin propanediol (FARXIGA) 10 MG TABS tablet Take 1 tablet (10 mg total) by mouth daily. 04/25/22  Yes Cotey Rakes, MiInes BloomerMD  docusate sodium (COLACE) 100 MG capsule Take 1 capsule (100 mg total) by mouth daily as needed for up to 30 doses. 09/27/21  Yes GaJanith LimaMD  finasteride (PROSCAR) 5 MG tablet Take 5 mg by mouth in the morning.   Yes [provider]  glipiZIDE (GLUCOTROL XL) 5 MG 24 hr tablet TAKE 1 TABLET BY MOUTH EVERY DAY WITH BREAKFAST 04/13/22  Yes Sharee Sturdy, MiInes BloomerMD  Lancets (ACCU-CHEK MULTICLIX) lancets Use to help check blood sugars twice a day 05/12/17  Yes Dragnev, AsCaesar ChestnutNP  oxyCODONE-acetaminophen (PERCOCET) 5-325 MG tablet Take 1 tablet by mouth every 4 (four) hours as needed for up to 18 doses for severe pain. 09/27/21  Yes GaJanith LimaMD  rosuvastatin (CRESTOR) 20 MG tablet Take 1 tablet (20 mg total) by mouth daily. 09/18/21  Yes Reagan Behlke, MiInes BloomerMD  tamsulosin (FLOMAX) 0.4 MG CAPS capsule Take 1 capsule (0.4 mg  total) by mouth daily. Patient taking differently: Take 0.4 mg by mouth every evening. 07/09/21  Yes MuMarrian SalvageFNP  Tetrahydroz-Dextran-PEG-Povid ([facility] CenterDVANCED RELIEF) 0.05-0.1-1-1 % SOLN Place 1-2 drops into both eyes 3 (three) times daily as needed (itchy/red/irritated eyes.).   Yes [provider]  valsartan (DIOVAN) 80 MG tablet Take 1 tablet (80 mg total) by mouth daily. 09/18/21  Yes SaHorald PollenMD    Allergies  Allergen Reactions   Metformin And Related Diarrhea   Jardiance [Empagliflozin] Rash    Patient Active Problem List   Diagnosis Date Noted   Current smoker 11/18/2021   BPH (benign prostatic hyperplasia) 09/27/2021   BPH with obstruction/lower urinary tract symptoms 09/18/2021   Chronic hepatitis C without hepatic coma ([place]/07/2019   Hypertension associated with diabetes ([place]/06/2017   Cigarette nicotine dependence with nicotine-induced disorder 04/09/2017   Dyslipidemia associated with type 2 diabetes mellitus ([facility]CD[place]07/18/2016   Dyslipidemia 09/22/2014   Elevated PSA 09/22/2014   GERD 12/30/2007   Type II diabetes mellitus, uncontrolled 12/29/2007   TUBULOVILLOUS ADENOMA, COLON, HX OF 12/29/2007    Past Medical History:  Diagnosis Date   Diabetes mellitus    Diabetes [place] without complication ([place]   GERD (gastroesophageal reflux disease)    Hyperlipidemia    Hypertension    Pneumonia     Past Surgical History:  Procedure Laterality Date   CARPAL TUNNEL RELEASE  both    COLONOSCOPY  2018   TRANSURETHRAL RESECTION OF PROSTATE N/A 09/27/2021   Procedure: TRANSURETHRAL RESECTION OF THE PROSTATE (TURP), BIPOLAR;  Surgeon: Janith Lima, MD;  Location: [facility];  Service: Urology;  Laterality: N/A;    Social History   Socioeconomic History   Marital status: Legally Separated    Spouse name: Not on file   Number of children: 4   Years of education: 11   Highest education level: Not on file  Occupational  History   Occupation: [employer]  Tobacco Use   Smoking status: Every Day    Packs/day: 0.25    Types: Cigarettes   Smokeless tobacco: Never   Tobacco comments:    Actively in the process of quitting  Vaping Use   Vaping Use: Never used  Substance and Sexual Activity   Alcohol use: Yes    Comment: occasionally on weekend   Drug use: No   Sexual activity: Not on file  Other Topics Concern   Not on file  Social History Narrative   ** Merged History Encounter **       Fun: Listen to music and be around family and friends. Denies religious beliefs effecting health care.    Social Determinants of Health   Financial Resource Strain: Not on file  Food Insecurity: Not on file  Transportation Needs: Not on file  Physical Activity: Not on file  Stress: Not on file  Social Connections: Not on file  Intimate Partner Violence: Not on file    Family History  Problem Relation Age of Onset   Breast cancer Mother    Heart disease Brother    Lung cancer Father    Healthy Paternal Grandmother    Cancer Brother    Colon cancer Neg Hx    Esophageal cancer Neg Hx    Stomach cancer Neg Hx    Rectal cancer Neg Hx    Colon polyps Neg Hx      Review of Systems  Constitutional: Negative.  Negative for chills and fever.  HENT: Negative.  Negative for congestion and sore throat.   Respiratory: Negative.  Negative for cough and shortness of breath.   Cardiovascular: Negative.  Negative for chest pain and palpitations.  Gastrointestinal:  Negative for abdominal pain, diarrhea, nausea and vomiting.  Genitourinary: Negative.  Negative for dysuria and hematuria.  Skin: Negative.  Negative for rash.  Neurological: Negative.  Negative for dizziness and headaches.  All other systems reviewed and are negative.   Today's Vitals   05/06/22 1058 05/06/22 1112  BP: (!) 150/98 124/80  Pulse: 88   Temp: 98.5 F (36.9 C)   TempSrc: Oral   SpO2: 95%   Weight: 234 lb (106.1 kg)   Height: 5'  11" (1.803 m)    Body mass index is 32.64 kg/m.  Physical Exam Vitals reviewed.  HENT:     Head: Normocephalic.  Eyes:     Extraocular Movements: Extraocular movements intact.     Conjunctiva/sclera: Conjunctivae normal.     Pupils: Pupils are equal, round, and reactive to light.  Cardiovascular:     Rate and Rhythm: Normal rate and regular rhythm.     Pulses: Normal pulses.     Heart sounds: Normal heart sounds.  Pulmonary:     Effort: Pulmonary effort is normal.     Breath sounds: Normal breath sounds.  Musculoskeletal:     Cervical back: No tenderness.  Lymphadenopathy:     Cervical: No cervical adenopathy.  Skin:    General: Skin is warm and dry.  Neurological:     General: No focal deficit present.     Mental Status: He is alert and oriented to person, place, and time.  Psychiatric:        Mood and Affect: Mood normal.        Behavior: Behavior normal.    Results for orders placed or performed in visit on 05/06/22 (from the past 24 hour(s))  POCT HgB A1C     Status: Abnormal   Collection Time: 05/06/22  1:06 PM  Result Value Ref Range   Hemoglobin A1C 7.4 (A) 4.0 - 5.6 %   HbA1c POC (<> result, manual entry)     HbA1c, POC (prediabetic range)     HbA1c, POC (controlled diabetic range)       ASSESSMENT & PLAN: A total of 45 minutes was spent with the patient and counseling/coordination of care regarding preparing for this visit, review of most recent office visit notes, review of multiple chronic medical conditions under management, review of all medications, review of most recent blood work results including interpretation of today's hemoglobin A1c, education on nutrition, smoking cessation advice, cardiovascular risks associated with hypertension and diabetes, prognosis, documentation, need for follow-up in 3 months.   Problem List Items Addressed This Visit       Cardiovascular and Mediastinum   Hypertension associated with diabetes ([place]) - Primary     Elevated blood pressure reading in the office but normal readings at home. Advised to continue monitoring blood pressure readings daily for the next couple weeks and keep a log.  Advised to contact the office if numbers persistently abnormal In the meantime continue valsartan 80 mg daily Cardiovascular risk associated with uncontrolled hypertension discussed Hemoglobin A1c at 7.4 better than before Continue Farxiga 10 mg daily and glipizide 5 mg daily Diet and nutrition discussed Follow-up in 3 months. Dietary approaches to stop hypertension discussed.      Relevant Orders   POCT HgB A1C     Endocrine   Dyslipidemia associated with type 2 diabetes mellitus ([place])    Stable.  Diet and nutrition discussed. Cardiovascular risk associated with dyslipidemia discussed. Continue rosuvastatin 20 mg daily. The 10-year ASCVD risk score (Arnett DK, et al., 2019) is: 50.2%   Values used to calculate the score:     Age: 19 years     Sex: Male     Is Non-Hispanic African American: Yes     Diabetic: Yes     Tobacco smoker: Yes     Systolic Blood Pressure: 093 mmHg     Is BP treated: Yes     HDL Cholesterol: 40.7 mg/dL     Total Cholesterol: 286 mg/dL         Nervous and Auditory   Cigarette nicotine dependence with nicotine-induced disorder     Genitourinary   BPH with obstruction/lower urinary tract symptoms    Symptoms much improved.  Continues Flomax 0.4 mg at bedtime along with Proscar 5 mg daily        Other   Current smoker    Cardiovascular and cancer risks associated with smoking discussed.  Smoking cessation advice given.      Patient Instructions  Diabetes Mellitus and Nutrition, Adult When you have diabetes, or diabetes mellitus, it is very important to have healthy eating habits because your blood sugar (glucose) levels are greatly affected by what you eat and drink. Eating healthy foods in the right amounts, at about  the same times every day, can help you: Manage your  blood glucose. Lower your risk of heart disease. Improve your blood pressure. Reach or maintain a healthy weight. What can affect my meal plan? Every person with diabetes is different, and each person has different needs for a meal plan. Your health care provider may recommend that you work with a dietitian to make a meal plan that is best for you. Your meal plan may vary depending on factors such as: The calories you need. The medicines you take. Your weight. Your blood glucose, blood pressure, and cholesterol levels. Your activity level. Other health conditions you have, such as heart or kidney disease. How do carbohydrates affect me? Carbohydrates, also called carbs, affect your blood glucose level more than any other type of food. Eating carbs raises the amount of glucose in your blood. It is important to know how many carbs you can safely have in each meal. This is different for every person. Your dietitian can help you calculate how many carbs you should have at each meal and for each snack. How does alcohol affect me? Alcohol can cause a decrease in blood glucose (hypoglycemia), especially if you use insulin or take certain diabetes medicines by mouth. Hypoglycemia can be a life-threatening condition. Symptoms of hypoglycemia, such as sleepiness, dizziness, and confusion, are similar to symptoms of having too much alcohol. Do not drink alcohol if: Your health care provider tells you not to drink. You are pregnant, may be pregnant, or are planning to become pregnant. If you drink alcohol: Limit how much you have to: 0-1 drink a day for women. 0-2 drinks a day for men. Know how much alcohol is in your drink. In the [place] one drink equals one 12 oz bottle of beer (355 mL), one 5 oz glass of wine (148 mL), or one 1 oz glass of hard liquor (44 mL). Keep yourself hydrated with water, diet soda, or unsweetened iced tea. Keep in mind that regular soda, juice, and other mixers may contain a  lot of sugar and must be counted as carbs. What are tips for following this plan?  Reading food labels Start by checking the serving size on the Nutrition Facts label of packaged foods and drinks. The number of calories and the amount of carbs, fats, and other nutrients listed on the label are based on one serving of the item. Many items contain more than one serving per package. Check the total grams (g) of carbs in one serving. Check the number of grams of saturated fats and trans fats in one serving. Choose foods that have a low amount or none of these fats. Check the number of milligrams (mg) of salt (sodium) in one serving. Most people should limit total sodium intake to less than 2,300 mg per day. Always check the nutrition information of foods labeled as "low-fat" or "nonfat." These foods may be higher in added sugar or refined carbs and should be avoided. Talk to your dietitian to identify your daily goals for nutrients listed on the label. Shopping Avoid buying canned, pre-made, or processed foods. These foods tend to be high in fat, sodium, and added sugar. Shop around the outside edge of the grocery store. This is where you will most often find fresh fruits and vegetables, bulk grains, fresh meats, and fresh dairy products. Cooking Use low-heat cooking methods, such as baking, instead of high-heat cooking methods, such as deep frying. Cook using healthy oils, such as olive, canola, or sunflower oil.  Avoid cooking with butter, cream, or high-fat meats. Meal planning Eat meals and snacks regularly, preferably at the same times every day. Avoid going long periods of time without eating. Eat foods that are high in fiber, such as fresh fruits, vegetables, beans, and whole grains. Eat 4-6 oz (112-168 g) of lean protein each day, such as lean meat, chicken, fish, eggs, or tofu. One ounce (oz) (28 g) of lean protein is equal to: 1 oz (28 g) of meat, chicken, or fish. 1 egg.  cup (62 g) of  tofu. Eat some foods each day that contain healthy fats, such as avocado, nuts, seeds, and fish. What foods should I eat? Fruits Berries. Apples. Oranges. Peaches. Apricots. Plums. Grapes. Mangoes. Papayas. Pomegranates. Kiwi. Cherries. Vegetables Leafy greens, including lettuce, spinach, kale, chard, collard greens, mustard greens, and cabbage. Beets. Cauliflower. Broccoli. Carrots. Green beans. Tomatoes. Peppers. Onions. Cucumbers. Brussels sprouts. Grains Whole grains, such as whole-wheat or whole-grain bread, crackers, tortillas, cereal, and pasta. Unsweetened oatmeal. Quinoa. Brown or wild rice. Meats and other proteins Seafood. Poultry without skin. Lean cuts of poultry and beef. Tofu. Nuts. Seeds. Dairy Low-fat or fat-free dairy products such as milk, yogurt, and cheese. The items listed above may not be a complete list of foods and beverages you can eat and drink. Contact a dietitian for more information. What foods should I avoid? Fruits Fruits canned with syrup. Vegetables Canned vegetables. Frozen vegetables with butter or cream sauce. Grains Refined white flour and flour products such as bread, pasta, snack foods, and cereals. Avoid all processed foods. Meats and other proteins Fatty cuts of meat. Poultry with skin. Breaded or fried meats. Processed meat. Avoid saturated fats. Dairy Full-fat yogurt, cheese, or milk. Beverages Sweetened drinks, such as soda or iced tea. The items listed above may not be a complete list of foods and beverages you should avoid. Contact a dietitian for more information. Questions to ask a health care provider Do I need to meet with a certified diabetes care and education specialist? Do I need to meet with a dietitian? What number can I call if I have questions? When are the best times to check my blood glucose? Where to find more information: American Diabetes Association: diabetes.org Academy of Nutrition and Dietetics:  eatright.[employer] of Diabetes and Digestive and Kidney Diseases: [employer]en[employer].is Association of Diabetes Care & Education Specialists: diabeteseducator.org Summary It is important to have healthy eating habits because your blood sugar (glucose) levels are greatly affected by what you eat and drink. It is important to use alcohol carefully. A healthy meal plan will help you manage your blood glucose and lower your risk of heart disease. Your health care provider may recommend that you work with a dietitian to make a meal plan that is best for you. This information is not intended to replace advice given to you by your health care provider. Make sure you discuss any questions you have with your health care provider. Document Revised: 10/26/2019 Document Reviewed: 10/26/2019 Elsevier Patient Education  [place], MD [facility]

## 2022-06-10 ENCOUNTER — Ambulatory Visit (INDEPENDENT_AMBULATORY_CARE_PROVIDER_SITE_OTHER): Payer: Medicare Other

## 2022-06-10 ENCOUNTER — Telehealth: Payer: Self-pay

## 2022-06-10 VITALS — Ht 71.0 in | Wt 220.0 lb

## 2022-06-10 DIAGNOSIS — Z0000 Encounter for general adult medical examination without abnormal findings: Secondary | ICD-10-CM

## 2022-06-10 NOTE — Telephone Encounter (Signed)
Called patient lvm to return call, to complete AWV at [phone].  If no return call within 15 minutes, patient may reschedule for the next available appointment with [facility] or [facility].  Yeshaya Vath N. Pricilla Moehle, LPN. [facility] [place] Team Direct Dial: [phone]

## 2022-06-10 NOTE — Patient Instructions (Addendum)
Nathaniel Waters , Thank you for taking time to come for your Medicare Wellness Visit. I appreciate your ongoing commitment to your health goals. Please review the following plan we discussed and let me know if I can assist you in the future.   These are the goals we discussed:  Goals      My healthcare goal for 2024 is to maintain my current health status by continuing to eat healthy, stay independent, physically and socially active.        This is a list of the screening recommended for you and due dates:  Health Maintenance  Topic Date Due   COVID-19 Vaccine (1) Never done   Eye exam for diabetics  Never done   Yearly kidney health urinalysis for diabetes  12/10/2017   Complete foot exam   12/10/2017   Flu Shot  07/06/2022*   Zoster (Shingles) Vaccine (1 of 2) 08/05/2022*   Pneumonia Vaccine (1 of 2 - PCV) 05/07/2023*   Yearly kidney function blood test for diabetes  09/29/2022   Hemoglobin A1C  11/04/2022   Medicare Annual Wellness Visit  06/10/2023   Colon Cancer Screening  08/24/2023   DTaP/Tdap/Td vaccine (3 - Td or Tdap) 03/21/2029   Hepatitis C Screening: USPSTF Recommendation to screen - Ages 37-79 yo.  Completed   HPV Vaccine  Aged Out  *Topic was postponed. The date shown is not the original due date.    Advanced directives: Yes  Conditions/risks identified: Yes; Type II Diabetes  Next appointment: Follow up in one year for your annual wellness visit.   Preventive Care 22 Years and Older, Male  Preventive care refers to lifestyle choices and visits with your health care provider that can promote health and wellness. What does preventive care include? A yearly physical exam. This is also called an annual well check. Dental exams once or twice a year. Routine eye exams. Ask your health care provider how often you should have your eyes checked. Personal lifestyle choices, including: Daily care of your teeth and gums. Regular physical activity. Eating a healthy  diet. Avoiding tobacco and drug use. Limiting alcohol use. Practicing safe sex. Taking low doses of aspirin every day. Taking vitamin and mineral supplements as recommended by your health care provider. What happens during an annual well check? The services and screenings done by your health care provider during your annual well check will depend on your age, overall health, lifestyle risk factors, and family history of disease. Counseling  Your health care provider may ask you questions about your: Alcohol use. Tobacco use. Drug use. Emotional well-being. Home and relationship well-being. Sexual activity. Eating habits. History of falls. Memory and ability to understand (cognition). Work and work Statistician. Screening  You may have the following tests or measurements: Height, weight, and BMI. Blood pressure. Lipid and cholesterol levels. These may be checked every 5 years, or more frequently if you are over 33 years old. Skin check. Lung cancer screening. You may have this screening every year starting at age 42 if you have a 30-pack-year history of smoking and currently smoke or have quit within the past 15 years. Fecal occult blood test (FOBT) of the stool. You may have this test every year starting at age 103. Flexible sigmoidoscopy or colonoscopy. You may have a sigmoidoscopy every 5 years or a colonoscopy every 10 years starting at age 1. Prostate cancer screening. Recommendations will vary depending on your family history and other risks. Hepatitis C blood test. Hepatitis B blood  test. Sexually transmitted disease (STD) testing. Diabetes screening. This is done by checking your blood sugar (glucose) after you have not eaten for a while (fasting). You may have this done every 1-3 years. Abdominal aortic aneurysm (AAA) screening. You may need this if you are a current or former smoker. Osteoporosis. You may be screened starting at age 39 if you are at high risk. Talk with  your health care provider about your test results, treatment options, and if necessary, the need for more tests. Vaccines  Your health care provider may recommend certain vaccines, such as: Influenza vaccine. This is recommended every year. Tetanus, diphtheria, and acellular pertussis (Tdap, Td) vaccine. You may need a Td booster every 10 years. Zoster vaccine. You may need this after age 44. Pneumococcal 13-valent conjugate (PCV13) vaccine. One dose is recommended after age 23. Pneumococcal polysaccharide (PPSV23) vaccine. One dose is recommended after age 58. Talk to your health care provider about which screenings and vaccines you need and how often you need them. This information is not intended to replace advice given to you by your health care provider. Make sure you discuss any questions you have with your health care provider. Document Released: 04/20/2015 Document Revised: 12/12/2015 Document Reviewed: 01/23/2015 Elsevier Interactive Patient Education  2017 [place] Prevention in the Home Falls can cause injuries. They can happen to people of all ages. There are many things you can do to make your home safe and to help prevent falls. What can I do on the outside of my home? Regularly fix the edges of walkways and driveways and fix any cracks. Remove anything that might make you trip as you walk through a door, such as a raised step or threshold. Trim any bushes or trees on the path to your home. Use bright outdoor lighting. Clear any walking paths of anything that might make someone trip, such as rocks or tools. Regularly check to see if handrails are loose or broken. Make sure that both sides of any steps have handrails. Any raised decks and porches should have guardrails on the edges. Have any leaves, snow, or ice cleared regularly. Use sand or salt on walking paths during winter. Clean up any spills in your garage right away. This includes oil or grease spills. What  can I do in the bathroom? Use night lights. Install grab bars by the toilet and in the tub and shower. Do not use towel bars as grab bars. Use non-skid mats or decals in the tub or shower. If you need to sit down in the shower, use a plastic, non-slip stool. Keep the floor dry. Clean up any water that spills on the floor as soon as it happens. Remove soap buildup in the tub or shower regularly. Attach bath mats securely with double-sided non-slip rug tape. Do not have throw rugs and other things on the floor that can make you trip. What can I do in the bedroom? Use night lights. Make sure that you have a light by your bed that is easy to reach. Do not use any sheets or blankets that are too big for your bed. They should not hang down onto the floor. Have a firm chair that has side arms. You can use this for support while you get dressed. Do not have throw rugs and other things on the floor that can make you trip. What can I do in the kitchen? Clean up any spills right away. Avoid walking on wet floors. Keep items that  you use a lot in easy-to-reach places. If you need to reach something above you, use a strong step stool that has a grab bar. Keep electrical cords out of the way. Do not use floor polish or wax that makes floors slippery. If you must use wax, use non-skid floor wax. Do not have throw rugs and other things on the floor that can make you trip. What can I do with my stairs? Do not leave any items on the stairs. Make sure that there are handrails on both sides of the stairs and use them. Fix handrails that are broken or loose. Make sure that handrails are as long as the stairways. Check any carpeting to make sure that it is firmly attached to the stairs. Fix any carpet that is loose or worn. Avoid having throw rugs at the top or bottom of the stairs. If you do have throw rugs, attach them to the floor with carpet tape. Make sure that you have a light switch at the top of the  stairs and the bottom of the stairs. If you do not have them, ask someone to add them for you. What else can I do to help prevent falls? Wear shoes that: Do not have high heels. Have rubber bottoms. Are comfortable and fit you well. Are closed at the toe. Do not wear sandals. If you use a stepladder: Make sure that it is fully opened. Do not climb a closed stepladder. Make sure that both sides of the stepladder are locked into place. Ask someone to hold it for you, if possible. Clearly mark and make sure that you can see: Any grab bars or handrails. First and last steps. Where the edge of each step is. Use tools that help you move around (mobility aids) if they are needed. These include: Canes. Walkers. Scooters. Crutches. Turn on the lights when you go into a dark area. Replace any light bulbs as soon as they burn out. Set up your furniture so you have a clear path. Avoid moving your furniture around. If any of your floors are uneven, fix them. If there are any pets around you, be aware of where they are. Review your medicines with your doctor. Some medicines can make you feel dizzy. This can increase your chance of falling. Ask your doctor what other things that you can do to help prevent falls. This information is not intended to replace advice given to you by your health care provider. Make sure you discuss any questions you have with your health care provider. Document Released: 01/18/2009 Document Revised: 08/30/2015 Document Reviewed: 04/28/2014 Elsevier Interactive Patient Education  2017 [employer].

## 2022-06-10 NOTE — Telephone Encounter (Signed)
Patient returned phone call and AWV-I was completed by nurse.  Nathaniel Waters [name], LPN. [name] Team Direct Dial: [phone]

## 2022-06-10 NOTE — Progress Notes (Addendum)
I connected with  Judeth Porch on 06/10/2022 at 10:00 a.m. EST by telephone and verified that I am speaking with the correct person using two identifiers.  Location: Patient: Home Provider: [place] Persons participating in the virtual visit: [place]   I discussed the limitations, risks, security and privacy concerns of performing an evaluation and management service by telephone and the availability of in person appointments. The patient expressed understanding and agreed to proceed.  Interactive audio and video telecommunications were attempted between this nurse and patient, however failed, due to patient having technical difficulties OR patient did not have access to video capability.  We continued and completed visit with audio only.  Some vital signs may be absent or patient reported.   Sheral Flow, LPN  Subjective:   Nathaniel Waters is a 67 y.o. male who presents for an Initial Medicare Annual Wellness Visit.  Review of Systems     Cardiac Risk Factors include: advanced age (>21mn, >>62women);diabetes mellitus;hypertension;dyslipidemia;male gender;obesity (BMI >30kg/m2);smoking/ tobacco exposure;family history of premature cardiovascular disease     Objective:    Today's Vitals   06/10/22 1009  Weight: 220 lb (99.8 kg)  Height: '5\' 11"'$  (1.803 m)  PainSc: 0-No pain   Body mass index is 30.68 kg/m.     06/10/2022   10:13 AM 09/27/2021    7:00 PM 09/25/2021    8:05 AM 02/21/2018    1:46 PM 02/20/2018   11:37 PM 11/07/2016    8:33 AM  Advanced Directives  Does Patient Have a Medical Advance Directive? Yes No No No No No  [place]       Copy of [place] Chart? No - copy requested       Would patient like information on creating a medical advance directive?  No - Patient declined  No - Patient declined No - Patient declined     Current Medications  (verified) Outpatient Encounter Medications as of 06/10/2022  Medication Sig   ACCU-CHEK GUIDE test strip TEST UP TO 4 TIMES A DAY   blood glucose meter kit and supplies KIT Dispense based on patient and insurance preference. Use up to four times daily as directed. (FOR ICD-9 250.00, 250.01).   Blood Glucose Monitoring Suppl (ACCU-CHEK AVIVA PLUS) w/Device KIT Use to check blood sugars twice a day   dapagliflozin propanediol (FARXIGA) 10 MG TABS tablet Take 1 tablet (10 mg total) by mouth daily.   docusate sodium (COLACE) 100 MG capsule Take 1 capsule (100 mg total) by mouth daily as needed for up to 30 doses.   finasteride (PROSCAR) 5 MG tablet Take 5 mg by mouth in the morning.   glipiZIDE (GLUCOTROL XL) 5 MG 24 hr tablet TAKE 1 TABLET BY MOUTH EVERY DAY WITH BREAKFAST   Lancets (ACCU-CHEK MULTICLIX) lancets Use to help check blood sugars twice a day   rosuvastatin (CRESTOR) 20 MG tablet Take 1 tablet (20 mg total) by mouth daily.   tamsulosin (FLOMAX) 0.4 MG CAPS capsule Take 1 capsule (0.4 mg total) by mouth daily. (Patient taking differently: Take 0.4 mg by mouth every evening.)   Tetrahydroz-Dextran-PEG-Povid (VISINE ADVANCED RELIEF) 0.05-0.1-1-1 % SOLN Place 1-2 drops into both eyes 3 (three) times daily as needed (itchy/red/irritated eyes.).   valsartan (DIOVAN) 80 MG tablet Take 1 tablet (80 mg total) by mouth daily.   oxyCODONE-acetaminophen (PERCOCET) 5-325 MG tablet Take 1 tablet by mouth every 4 (four) hours as needed  for up to 18 doses for severe pain. (Patient not taking: Reported on 06/10/2022)   No facility-administered encounter medications on file as of 06/10/2022.    Allergies (verified) Metformin and related and Jardiance [empagliflozin]   History: Past Medical History:  Diagnosis Date   Diabetes mellitus    Diabetes mellitus without complication ([place])    GERD (gastroesophageal reflux disease)    Hyperlipidemia    Hypertension    Pneumonia    Past Surgical History:   Procedure Laterality Date   CARPAL TUNNEL RELEASE     both    COLONOSCOPY  2018   TRANSURETHRAL RESECTION OF PROSTATE N/A 09/27/2021   Procedure: TRANSURETHRAL RESECTION OF THE PROSTATE (TURP), BIPOLAR;  Surgeon: Janith Lima, MD;  Location: [facility];  Service: Urology;  Laterality: N/A;   Family History  Problem Relation Age of Onset   Breast cancer Mother    Heart disease Brother    Lung cancer Father    Healthy Paternal Grandmother    Cancer Brother    Colon cancer Neg Hx    Esophageal cancer Neg Hx    Stomach cancer Neg Hx    Rectal cancer Neg Hx    Colon polyps Neg Hx    Social History   Socioeconomic History   Marital status: Legally Separated    Spouse name: Not on file   Number of children: 4   Years of education: 11   Highest education level: Not on file  Occupational History   Occupation: [employer]  Tobacco Use   Smoking status: Every Day    Packs/day: 0.25    Types: Cigarettes   Smokeless tobacco: Never   Tobacco comments:    Actively in the process of quitting  Vaping Use   Vaping Use: Never used  Substance and Sexual Activity   Alcohol use: Yes    Comment: occasionally on weekend   Drug use: No   Sexual activity: Not on file  Other Topics Concern   Not on file  Social History Narrative   ** Merged History Encounter **       Fun: Listen to music and be around family and friends. Denies religious beliefs effecting health care.    Social Determinants of Health   Financial Resource Strain: Low Risk  (06/10/2022)   Overall Financial Resource Strain (CARDIA)    Difficulty of Paying Living Expenses: Not hard at all  Food Insecurity: No Food Insecurity (06/10/2022)   Hunger Vital Sign    Worried About Running Out of Food in the Last Year: Never true    Ran Out of Food in the Last Year: Never true  Transportation Needs: No Transportation Needs (06/10/2022)   PRAPARE - Hydrologist (Medical): No    Lack of Transportation  (Non-Medical): No  Physical Activity: Sufficiently Active (06/10/2022)   Exercise Vital Sign    Days of Exercise per Week: 5 days    Minutes of Exercise per Session: 30 min  Stress: No Stress Concern Present (06/10/2022)   [place]    Feeling of Stress : Not at all  Social Connections: [place] (06/10/2022)   Social Connection and Isolation Panel [NHANES]    Frequency of Communication with Friends and Family: More than three times a week    Frequency of Social Gatherings with Friends and Family: More than three times a week    Attends Religious Services: More than 4 times per year  Active Member of Clubs or Organizations: Yes    Attends Music therapist: More than 4 times per year    Marital Status: Married    Tobacco Counseling Ready to quit: Not Answered Counseling given: Not Answered Tobacco comments: Actively in the process of quitting   Clinical Intake:  Pre-visit preparation completed: Yes  Pain : No/denies pain Pain Score: 0-No pain     BMI - recorded: 30.68 Nutritional Status: BMI > 30  Obese Nutritional Risks: None Diabetes: Yes CBG done?: Yes (124) CBG resulted in Enter/ Edit results?: Yes Did pt. bring in CBG monitor from home?: No  How often do you need to have someone help you when you read instructions, pamphlets, or other written materials from your doctor or pharmacy?: 1 - Never What is the last grade level you completed in school?: 12th grade  Nutrition Risk Assessment:  Has the patient had any N/V/D within the last 2 months?  No  Does the patient have any non-healing wounds?  No  Has the patient had any unintentional weight loss or weight gain?  No   Diabetes:  Is the patient diabetic?  Yes  If diabetic, was a CBG obtained today?  Yes ; patient checked glucose this morning: 124 Did the patient bring in their glucometer from home?  No  How often do you  monitor your CBG's? Every morning.   Financial Strains and Diabetes Management:  Are you having any financial strains with the device, your supplies or your medication? No .  Does the patient want to be seen by Chronic Care Management for management of their diabetes?  No  Would the patient like to be referred to a Nutritionist or for Diabetic Management?  No   Diabetic Exams:  Diabetic Eye Exam: Overdue for diabetic eye exam. Pt has been advised about the importance in completing this exam. Patient advised to call and schedule an eye exam. Diabetic Foot Exam: Overdue, Pt has been advised about the importance in completing this exam. Pt is scheduled for diabetic foot exam on 08/05/2022 with PCP.   Interpreter Needed?: No  Information entered by :: Lisette Abu, LPN.   Activities of Daily Living    06/10/2022   10:15 AM 09/27/2021    5:00 PM  In your present state of health, do you have any difficulty performing the following activities:  Hearing? 0 0  Vision? 0 0  Difficulty concentrating or making decisions? 0 0  Walking or climbing stairs? 0 0  Dressing or bathing? 0 0  Doing errands, shopping? 0 0  Preparing Food and eating ? N   Using the Toilet? N   In the past six months, have you accidently leaked urine? N   Do you have problems with loss of bowel control? N   Managing your Medications? N   Managing your Finances? N   Housekeeping or managing your Housekeeping? N     Patient Care Team: Horald Pollen, MD as PCP - [facility], [facility] as Consulting Physician (Ophthalmology) Delia Chimes, MD as Consulting Physician (Optometry)  Indicate any recent Medical Services you may have received from other than [facility] providers in the past year (date may be approximate).     Assessment:   This is a routine wellness examination for [facility].  Hearing/Vision screen Hearing Screening - Comments:: Denies hearing difficulties   Vision  Screening - Comments:: Wears rx glasses - up to date with routine eye exams with [facility]  Dietary issues and exercise activities discussed: Current Exercise Habits: Home exercise routine, Type of exercise: walking, Time (Minutes): 30, Frequency (Times/Week): 5, Weekly Exercise (Minutes/Week): 150, Intensity: Mild, Exercise limited by: None identified   Goals Addressed             This Visit's Progress    My healthcare goal for 2024 is to maintain my current health status by continuing to eat healthy, stay independent, physically and socially active.        Depression Screen    06/10/2022   10:13 AM 05/06/2022   10:58 AM 09/18/2021   10:22 AM 07/09/2021    3:58 PM 01/10/2020   11:06 AM 04/11/2019    4:01 PM 03/21/2019    2:24 PM  PHQ 2/9 Scores  PHQ - 2 Score 0 0 0 0 0 0 0  PHQ- 9 Score 0          Fall Risk    06/10/2022   10:13 AM 05/06/2022   10:58 AM 09/18/2021   10:22 AM 07/09/2021    3:58 PM 01/10/2020   11:06 AM  [place] in the past year? 0 0 0 0 0  Number falls in past yr: 0 0  0   Injury with Fall? 0 0  0   Risk for fall due to : No Fall Risks No Fall Risks  No Fall Risks No Fall Risks  Follow up Falls prevention discussed Falls evaluation completed  Falls evaluation completed Falls evaluation completed    [place]:  Any stairs in or around the home? No  If so, are there any without handrails? No  Home free of loose throw rugs in walkways, pet beds, electrical cords, etc? Yes  Adequate lighting in your home to reduce risk of falls? Yes   ASSISTIVE DEVICES UTILIZED TO PREVENT FALLS:  Life alert? No  Use of a cane, walker or w/c? No  Grab bars in the bathroom? No  Shower chair or bench in shower? No  Elevated toilet seat or a handicapped toilet? No   TIMED UP AND GO:  Was the test performed? No . Telephonic Visit  Cognitive Function:        06/10/2022   10:18 AM  6CIT Screen  What Year? 0 points   What month? 0 points  What time? 0 points  Count back from 20 0 points  Months in reverse 0 points  Repeat phrase 0 points  Total Score 0 points    Immunizations Immunization History  Administered Date(s) Administered   Influenza,inj,Quad PF,6+ Mos 12/10/2016, 02/18/2018, 01/15/2019   Tdap 09/24/2015, 03/22/2019    TDAP status: Up to date  Flu Vaccine status: Declined, Education has been provided regarding the importance of this vaccine but patient still declined. Advised may receive this vaccine at local pharmacy or Health Dept. Aware to provide a copy of the vaccination record if obtained from local pharmacy or Health Dept. Verbalized acceptance and understanding.  Pneumococcal vaccine status: Declined,  Education has been provided regarding the importance of this vaccine but patient still declined. Advised may receive this vaccine at local pharmacy or Health Dept. Aware to provide a copy of the vaccination record if obtained from local pharmacy or Health Dept. Verbalized acceptance and understanding.   Covid-19 vaccine status: Declined, Education has been provided regarding the importance of this vaccine but patient still declined. Advised may receive this vaccine at local pharmacy or Health Dept.or vaccine clinic. Aware  to provide a copy of the vaccination record if obtained from local pharmacy or Health Dept. Verbalized acceptance and understanding.  Qualifies for Shingles Vaccine? Yes   Zostavax completed No   Shingrix Completed?: No.    Education has been provided regarding the importance of this vaccine. Patient has been advised to call insurance company to determine out of pocket expense if they have not yet received this vaccine. Advised may also receive vaccine at local pharmacy or Health Dept. Verbalized acceptance and understanding.  Screening Tests Health Maintenance  Topic Date Due   COVID-19 Vaccine (1) Never done   OPHTHALMOLOGY EXAM  Never done   Diabetic kidney  evaluation - Urine ACR  12/10/2017   FOOT EXAM  12/10/2017   INFLUENZA VACCINE  07/06/2022 (Originally 11/05/2021)   Zoster Vaccines- Shingrix (1 of 2) 08/05/2022 (Originally 03/05/2005)   Pneumonia Vaccine 63+ Years old (1 of 2 - PCV) 05/07/2023 (Originally 03/05/1962)   Diabetic kidney evaluation - eGFR measurement  09/29/2022   HEMOGLOBIN A1C  11/04/2022   Medicare Annual Wellness (AWV)  06/10/2023   COLONOSCOPY (Pts 45-41yr Insurance coverage will need to be confirmed)  08/24/2023   DTaP/Tdap/Td (3 - Td or Tdap) 03/21/2029   Hepatitis C Screening  Completed   HPV VACCINES  Aged Out    Health Maintenance  Health Maintenance Due  Topic Date Due   COVID-19 Vaccine (1) Never done   OPHTHALMOLOGY EXAM  Never done   Diabetic kidney evaluation - Urine ACR  12/10/2017   FOOT EXAM  12/10/2017    Colorectal cancer screening: Type of screening: Colonoscopy. Completed 08/23/2020. Repeat every 3 years  Lung Cancer Screening: (Low Dose CT Chest recommended if Age 68-80years, 30 pack-year currently smoking OR have quit w/in 15years.) does not qualify.   Lung Cancer Screening Referral: no  Additional Screening:  Hepatitis C Screening: does qualify; Completed 09/18/2021  Vision Screening: Recommended annual ophthalmology exams for early detection of glaucoma and other disorders of the eye. Is the patient up to date with their annual eye exam?  No  Who is the provider or what is the name of the office in which the patient attends annual eye exams? [facility] EnergyIf pt is not established with a provider, would they like to be referred to a provider to establish care? No .   Dental Screening: Recommended annual dental exams for proper oral hygiene  Community Resource Referral / Chronic Care Management: CRR required this visit?  No   CCM required this visit?  No      Plan:     I have personally reviewed and noted the following in the patient's chart:   Medical and social  history Use of alcohol, tobacco or illicit drugs  Current medications and supplements including opioid prescriptions. Patient is not currently taking opioid prescriptions. Functional ability and status Nutritional status Physical activity Advanced directives List of other physicians Hospitalizations, surgeries, and ER visits in previous 12 months Vitals Screenings to include cognitive, depression, and falls Referrals and appointments  In addition, I have reviewed and discussed with patient certain preventive protocols, quality metrics, and best practice recommendations. A written personalized care plan for preventive services as well as general preventive health recommendations were provided to patient.     SSheral Flow LPN   [id number]  Nurse Notes:  Normal cognitive status assessed by direct observation by this Nurse Health Advisor. No abnormalities found.

## 2022-07-05 ENCOUNTER — Other Ambulatory Visit: Payer: Self-pay | Admitting: Emergency Medicine

## 2022-07-05 DIAGNOSIS — E1159 Type 2 diabetes mellitus with other circulatory complications: Secondary | ICD-10-CM

## 2022-07-05 DIAGNOSIS — E1169 Type 2 diabetes mellitus with other specified complication: Secondary | ICD-10-CM

## 2022-07-21 ENCOUNTER — Other Ambulatory Visit: Payer: Self-pay | Admitting: Emergency Medicine

## 2022-07-21 DIAGNOSIS — E1159 Type 2 diabetes mellitus with other circulatory complications: Secondary | ICD-10-CM

## 2022-07-28 DIAGNOSIS — E119 Type 2 diabetes mellitus without complications: Secondary | ICD-10-CM | POA: Diagnosis not present

## 2022-07-28 LAB — HM DIABETES EYE EXAM

## 2022-07-29 DIAGNOSIS — R350 Frequency of micturition: Secondary | ICD-10-CM | POA: Diagnosis not present

## 2022-08-05 ENCOUNTER — Encounter: Payer: Self-pay | Admitting: Emergency Medicine

## 2022-08-05 ENCOUNTER — Ambulatory Visit (INDEPENDENT_AMBULATORY_CARE_PROVIDER_SITE_OTHER): Payer: Medicare Other | Admitting: Emergency Medicine

## 2022-08-05 VITALS — BP 158/96 | HR 80 | Temp 98.2°F | Ht 71.0 in | Wt 228.2 lb

## 2022-08-05 DIAGNOSIS — F17200 Nicotine dependence, unspecified, uncomplicated: Secondary | ICD-10-CM | POA: Diagnosis not present

## 2022-08-05 DIAGNOSIS — Z7984 Long term (current) use of oral hypoglycemic drugs: Secondary | ICD-10-CM

## 2022-08-05 DIAGNOSIS — E1169 Type 2 diabetes mellitus with other specified complication: Secondary | ICD-10-CM

## 2022-08-05 DIAGNOSIS — E785 Hyperlipidemia, unspecified: Secondary | ICD-10-CM

## 2022-08-05 DIAGNOSIS — E1159 Type 2 diabetes mellitus with other circulatory complications: Secondary | ICD-10-CM | POA: Diagnosis not present

## 2022-08-05 DIAGNOSIS — N138 Other obstructive and reflux uropathy: Secondary | ICD-10-CM

## 2022-08-05 DIAGNOSIS — I152 Hypertension secondary to endocrine disorders: Secondary | ICD-10-CM

## 2022-08-05 DIAGNOSIS — N401 Enlarged prostate with lower urinary tract symptoms: Secondary | ICD-10-CM

## 2022-08-05 LAB — COMPREHENSIVE METABOLIC PANEL
ALT: 17 U/L (ref 0–53)
AST: 18 U/L (ref 0–37)
Albumin: 4.2 g/dL (ref 3.5–5.2)
Alkaline Phosphatase: 57 U/L (ref 39–117)
BUN: 14 mg/dL (ref 6–23)
CO2: 27 mEq/L (ref 19–32)
Calcium: 9.3 mg/dL (ref 8.4–10.5)
Chloride: 104 mEq/L (ref 96–112)
Creatinine, Ser: 1.17 mg/dL (ref 0.40–1.50)
GFR: 64.55 mL/min (ref >60.00)
Glucose, Bld: 98 mg/dL (ref 70–99)
Potassium: 4.2 mEq/L (ref 3.5–5.1)
Sodium: 138 mEq/L (ref 135–145)
Total Bilirubin: 0.4 mg/dL (ref 0.2–1.2)
Total Protein: 7.4 g/dL (ref 6.0–8.3)

## 2022-08-05 LAB — POCT GLYCOSYLATED HEMOGLOBIN (HGB A1C): Hemoglobin A1C: 6.1 % — AB (ref 4.0–5.6)

## 2022-08-05 LAB — MICROALBUMIN / CREATININE URINE RATIO
Creatinine,U: 93 mg/dL
Microalb Creat Ratio: 4.8 mg/g (ref 0.0–30.0)
Microalb, Ur: 4.4 mg/dL — ABNORMAL HIGH (ref 0.0–1.9)

## 2022-08-05 LAB — LIPID PANEL
Cholesterol: 146 mg/dL (ref 0–200)
HDL: 41.3 mg/dL (ref >39.00)
LDL Cholesterol: 74 mg/dL (ref 0–99)
NonHDL: 104.5
Total CHOL/HDL Ratio: 4
Triglycerides: 155 mg/dL — ABNORMAL HIGH (ref 0.0–149.0)
VLDL: 31 mg/dL (ref 0.0–40.0)

## 2022-08-05 LAB — CBC WITH DIFFERENTIAL/PLATELET
Basophils Absolute: 0 10*3/uL (ref 0.0–0.1)
Basophils Relative: 0.4 % (ref 0.0–3.0)
Eosinophils Absolute: 0.1 10*3/uL (ref 0.0–0.7)
Eosinophils Relative: 1.1 % (ref 0.0–5.0)
HCT: 48.4 % (ref 39.0–52.0)
Hemoglobin: 16 g/dL (ref 13.0–17.0)
Lymphocytes Relative: 36 % (ref 12.0–46.0)
Lymphs Abs: 2.6 10*3/uL (ref 0.7–4.0)
MCHC: 33.1 g/dL (ref 30.0–36.0)
MCV: 91.1 fl (ref 78.0–100.0)
Monocytes Absolute: 0.7 10*3/uL (ref 0.1–1.0)
Monocytes Relative: 9.9 % (ref 3.0–12.0)
Neutro Abs: 3.9 10*3/uL (ref 1.4–7.7)
Neutrophils Relative %: 52.6 % (ref 43.0–77.0)
Platelets: 177 10*3/uL (ref 150.0–400.0)
RBC: 5.31 Mil/uL (ref 4.22–5.81)
RDW: 14.6 % (ref 11.5–15.5)
WBC: 7.4 10*3/uL (ref 4.0–10.5)

## 2022-08-05 MED ORDER — VALSARTAN-HYDROCHLOROTHIAZIDE 160-12.5 MG PO TABS
1.0000 | ORAL_TABLET | Freq: Every day | ORAL | 3 refills | Status: DC
Start: 2022-08-05 — End: 2023-07-07

## 2022-08-05 NOTE — Assessment & Plan Note (Signed)
Chronic stable condition Continue rosuvastatin 20 mg daily The 10-year ASCVD risk score (Arnett DK, et al., 2019) is: 67%   Values used to calculate the score:     Age: 67 years     Sex: Male     Is Non-Hispanic African American: Yes     Diabetic: Yes     Tobacco smoker: Yes     Systolic Blood Pressure: 158 mmHg     Is BP treated: Yes     HDL Cholesterol: 40.7 mg/dL     Total Cholesterol: 286 mg/dL

## 2022-08-05 NOTE — Progress Notes (Signed)
Nathaniel Waters 67 y.o.   Chief Complaint  Patient presents with   Medical Management of Chronic Issues    f/u appt, no concerns     HISTORY OF PRESENT ILLNESS: This is a 67 y.o. male here for 61-month follow-up of hypertension and diabetes Overall doing well.  Has no complaints or medical concerns today. Lab Results  Component Value Date   HGBA1C 7.4 (A) 05/06/2022   BP Readings from Last 3 Encounters:  08/05/22 (!) 158/96  05/06/22 124/80  11/18/21 127/70   Wt Readings from Last 3 Encounters:  08/05/22 228 lb 4 oz (103.5 kg)  06/10/22 220 lb (99.8 kg)  05/06/22 234 lb (106.1 kg)     HPI   Prior to Admission medications   Medication Sig Start Date End Date Taking? Authorizing Provider  ACCU-CHEK GUIDE test strip TEST UP TO 4 TIMES A DAY 04/14/22  Yes Olive Bass, FNP  blood glucose meter kit and supplies KIT Dispense based on patient and insurance preference. Use up to four times daily as directed. (FOR ICD-9 250.00, 250.01). 04/24/20  Yes Olive Bass, FNP  Blood Glucose Monitoring Suppl (ACCU-CHEK AVIVA PLUS) w/Device KIT Use to check blood sugars twice a day 05/12/17  Yes Dragnev, Alphonse Guild, NP  docusate sodium (COLACE) 100 MG capsule Take 1 capsule (100 mg total) by mouth daily as needed for up to 30 doses. 09/27/21  Yes Jannifer Hick, MD  FARXIGA 10 MG TABS tablet TAKE 1 TABLET BY MOUTH EVERY DAY 07/21/22  Yes Isley Zinni, Eilleen Kempf, MD  glipiZIDE (GLUCOTROL XL) 5 MG 24 hr tablet TAKE 1 TABLET BY MOUTH EVERY DAY WITH BREAKFAST 04/13/22  Yes Dail Lerew, Eilleen Kempf, MD  Lancets (ACCU-CHEK MULTICLIX) lancets Use to help check blood sugars twice a day 05/12/17  Yes Dragnev, Alphonse Guild, NP  rosuvastatin (CRESTOR) 20 MG tablet TAKE 1 TABLET BY MOUTH EVERY DAY 07/06/22  Yes Georgina Quint, MD  valsartan (DIOVAN) 80 MG tablet TAKE 1 TABLET BY MOUTH EVERY DAY 07/06/22  Yes Josanne Boerema, Eilleen Kempf, MD  finasteride (PROSCAR) 5 MG tablet Take 5 mg by  mouth in the morning. Patient not taking: Reported on 08/05/2022    [provider]  tamsulosin (FLOMAX) 0.4 MG CAPS capsule Take 1 capsule (0.4 mg total) by mouth daily. Patient not taking: Reported on 08/05/2022 07/09/21   Olive Bass, FNP  Tetrahydroz-Dextran-PEG-Povid [facility] ADVANCED RELIEF) 0.05-0.1-1-1 % SOLN Place 1-2 drops into both eyes 3 (three) times daily as needed (itchy/red/irritated eyes.).    [provider]    Allergies  Allergen Reactions   Metformin And Related Diarrhea   Jardiance [Empagliflozin] Rash    Patient Active Problem List   Diagnosis Date Noted   Current smoker 11/18/2021   BPH (benign prostatic hyperplasia) 09/27/2021   BPH with obstruction/lower urinary tract symptoms 09/18/2021   Chronic hepatitis C without hepatic coma (HCC) 04/11/2019   Hypertension associated with diabetes (HCC) 04/09/2017   Cigarette nicotine dependence with nicotine-induced disorder 04/09/2017   Dyslipidemia associated with type 2 diabetes mellitus (HCC) 10/23/2014   Dyslipidemia 09/22/2014   Elevated PSA 09/22/2014   GERD 12/30/2007   Type II diabetes mellitus, uncontrolled 12/29/2007   TUBULOVILLOUS ADENOMA, COLON, HX OF 12/29/2007    Past Medical History:  Diagnosis Date   Diabetes mellitus    Diabetes mellitus without complication (HCC)    GERD (gastroesophageal reflux disease)    Hyperlipidemia    Hypertension    Pneumonia  Past Surgical History:  Procedure Laterality Date   CARPAL TUNNEL RELEASE     both    COLONOSCOPY  2018   TRANSURETHRAL RESECTION OF PROSTATE N/A 09/27/2021   Procedure: TRANSURETHRAL RESECTION OF THE PROSTATE (TURP), BIPOLAR;  Surgeon: Jannifer Hick, MD;  Location: [facility];  Service: Urology;  Laterality: N/A;    Social History   Socioeconomic History   Marital status: Legally Separated    Spouse name: Not on file   Number of children: 4   Years of education: 11   Highest education level: Not on file   Occupational History   Occupation: Loading Dock  Tobacco Use   Smoking status: Every Day    Packs/day: .25    Types: Cigarettes   Smokeless tobacco: Never   Tobacco comments:    Actively in the process of quitting  Vaping Use   Vaping Use: Never used  Substance and Sexual Activity   Alcohol use: Yes    Comment: occasionally on weekend   Drug use: No   Sexual activity: Not on file  Other Topics Concern   Not on file  Social History Narrative   ** Merged History Encounter **       Fun: Listen to music and be around family and friends. Denies religious beliefs effecting health care.    Social Determinants of Health   Financial Resource Strain: Low Risk  (06/10/2022)   Overall Financial Resource Strain (CARDIA)    Difficulty of Paying Living Expenses: Not hard at all  Food Insecurity: No Food Insecurity (06/10/2022)   Hunger Vital Sign    Worried About Running Out of Food in the Last Year: Never true    Ran Out of Food in the Last Year: Never true  Transportation Needs: No Transportation Needs (06/10/2022)   PRAPARE - Administrator, Civil Service (Medical): No    Lack of Transportation (Non-Medical): No  Physical Activity: Sufficiently Active (06/10/2022)   Exercise Vital Sign    Days of Exercise per Week: 5 days    Minutes of Exercise per Session: 30 min  Stress: No Stress Concern Present (06/10/2022)   [employer] of Occupational Health - Occupational Stress Questionnaire    Feeling of Stress : Not at all  Social Connections: Socially Integrated (06/10/2022)   Social Connection and Isolation Panel [NHANES]    Frequency of Communication with Friends and Family: More than three times a week    Frequency of Social Gatherings with Friends and Family: More than three times a week    Attends Religious Services: More than 4 times per year    Active Member of [employer] or Organizations: Yes    Attends Engineer, structural: More than 4 times per year    Marital  Status: Married  Catering manager Violence: Not At Risk (06/10/2022)   Humiliation, Afraid, Rape, and Kick questionnaire    Fear of Current or Ex-Partner: No    Emotionally Abused: No    Physically Abused: No    Sexually Abused: No    Family History  Problem Relation Age of Onset   Breast cancer Mother    Heart disease Brother    Lung cancer Father    Healthy Paternal Grandmother    Cancer Brother    Colon cancer Neg Hx    Esophageal cancer Neg Hx    Stomach cancer Neg Hx    Rectal cancer Neg Hx    Colon polyps Neg Hx  Review of Systems  Constitutional: Negative.  Negative for chills and fever.  HENT: Negative.  Negative for congestion and sore throat.   Respiratory: Negative.  Negative for cough and shortness of breath.   Cardiovascular: Negative.  Negative for chest pain and palpitations.  Gastrointestinal:  Negative for abdominal pain, diarrhea, nausea and vomiting.  Genitourinary: Negative.  Negative for dysuria and hematuria.  Skin: Negative.  Negative for rash.  Neurological: Negative.  Negative for dizziness and headaches.  All other systems reviewed and are negative.   Vitals:   08/05/22 1000  BP: (!) 158/96  Pulse: 80  Temp: 98.2 F (36.8 C)  SpO2: 97%    Physical Exam Vitals reviewed.  Constitutional:      Appearance: Normal appearance.  HENT:     Head: Normocephalic.     Mouth/Throat:     Mouth: Mucous membranes are moist.     Pharynx: Oropharynx is clear.  Eyes:     Extraocular Movements: Extraocular movements intact.     Conjunctiva/sclera: Conjunctivae normal.     Pupils: Pupils are equal, round, and reactive to light.  Cardiovascular:     Rate and Rhythm: Normal rate and regular rhythm.     Pulses: Normal pulses.     Heart sounds: Normal heart sounds.  Pulmonary:     Effort: Pulmonary effort is normal.     Breath sounds: Normal breath sounds.  Abdominal:     Palpations: Abdomen is soft.     Tenderness: There is no abdominal  tenderness.  Musculoskeletal:     Cervical back: No tenderness.     Right lower leg: No edema.     Left lower leg: No edema.  Lymphadenopathy:     Cervical: No cervical adenopathy.  Skin:    General: Skin is warm and dry.     Capillary Refill: Capillary refill takes less than 2 seconds.  Neurological:     General: No focal deficit present.     Mental Status: He is alert and oriented to person, place, and time.  Psychiatric:        Mood and Affect: Mood normal.        Behavior: Behavior normal.    Results for orders placed or performed in visit on 08/05/22 (from the past 24 hour(s))  POCT HgB A1C     Status: Abnormal   Collection Time: 08/05/22 11:20 AM  Result Value Ref Range   Hemoglobin A1C 6.1 (A) 4.0 - 5.6 %   HbA1c POC (<> result, manual entry)     HbA1c, POC (prediabetic range)     HbA1c, POC (controlled diabetic range)       ASSESSMENT & PLAN: A total of 44 minutes was spent with the patient and counseling/coordination of care regarding preparing for this visit, review of most recent office visit notes, review of multiple chronic medical conditions and their management, review of most recent blood work results including interpretation of today's hemoglobin A1c, review of all medications and changes made, education on nutrition, smoking cessation advice, cardiovascular risks associated with uncontrolled hypertension, prognosis, documentation, and need for follow-up  Problem List Items Addressed This Visit       Cardiovascular and Mediastinum   Hypertension associated with diabetes (HCC) - Primary    Elevated blood pressure reading in the office Cardiovascular risks associated with uncontrolled hypertension discussed Dietary approaches to stop hypertension discussed Recommend to stop valsartan 80 and start valsartan HCT 160-12.5 mg daily Advised to monitor blood pressure readings at home daily  for the next several weeks and keep a log.  Advised to contact the office if  numbers persistently abnormal. Well-controlled diabetes with hemoglobin A1c of 6.1 Diet and nutrition discussed Recommend to continue glipizide 5 mg daily and Farxiga 10 mg daily      Relevant Medications   valsartan-hydrochlorothiazide (DIOVAN-HCT) 160-12.5 MG tablet   Other Relevant Orders   POCT HgB A1C   Urine Microalbumin w/creat. ratio   Comprehensive metabolic panel   Lipid panel   CBC with Differential/Platelet     Endocrine   Dyslipidemia associated with type 2 diabetes mellitus (HCC)    Chronic stable condition Continue rosuvastatin 20 mg daily The 10-year ASCVD risk score (Arnett DK, et al., 2019) is: 67%   Values used to calculate the score:     Age: 74 years     Sex: Male     Is Non-Hispanic African American: Yes     Diabetic: Yes     Tobacco smoker: Yes     Systolic Blood Pressure: 158 mmHg     Is BP treated: Yes     HDL Cholesterol: 40.7 mg/dL     Total Cholesterol: 286 mg/dL       Relevant Medications   valsartan-hydrochlorothiazide (DIOVAN-HCT) 160-12.5 MG tablet   Other Relevant Orders   POCT HgB A1C   Urine Microalbumin w/creat. ratio   Comprehensive metabolic panel   Lipid panel   CBC with Differential/Platelet     Genitourinary   BPH with obstruction/lower urinary tract symptoms    Stable and well-controlled.  No concerns.        Other   Current smoker    Smoking a lot less, in the process of quitting. Cardiovascular/cancer risk associated with smoking discussed. Smoking cessation encouraged.      Patient Instructions  Hypertension, Adult High blood pressure (hypertension) is when the force of blood pumping through the arteries is too strong. The arteries are the blood vessels that carry blood from the heart throughout the body. Hypertension forces the heart to work harder to pump blood and may cause arteries to become narrow or stiff. Untreated or uncontrolled hypertension can lead to a heart attack, heart failure, a stroke, kidney  disease, and other problems. A blood pressure reading consists of a higher number over a lower number. Ideally, your blood pressure should be below 120/80. The first ("top") number is called the systolic pressure. It is a measure of the pressure in your arteries as your heart beats. The second ("bottom") number is called the diastolic pressure. It is a measure of the pressure in your arteries as the heart relaxes. What are the causes? The exact cause of this condition is not known. There are some conditions that result in high blood pressure. What increases the risk? Certain factors may make you more likely to develop high blood pressure. Some of these risk factors are under your control, including: Smoking. Not getting enough exercise or physical activity. Being overweight. Having too much fat, sugar, calories, or salt (sodium) in your diet. Drinking too much alcohol. Other risk factors include: Having a personal history of heart disease, diabetes, high cholesterol, or kidney disease. Stress. Having a family history of high blood pressure and high cholesterol. Having obstructive sleep apnea. Age. The risk increases with age. What are the signs or symptoms? High blood pressure may not cause symptoms. Very high blood pressure (hypertensive crisis) may cause: Headache. Fast or irregular heartbeats (palpitations). Shortness of breath. Nosebleed. Nausea and vomiting. Vision changes. Severe  chest pain, dizziness, and seizures. How is this diagnosed? This condition is diagnosed by measuring your blood pressure while you are seated, with your arm resting on a flat surface, your legs uncrossed, and your feet flat on the floor. The cuff of the blood pressure monitor will be placed directly against the skin of your upper arm at the level of your heart. Blood pressure should be measured at least twice using the same arm. Certain conditions can cause a difference in blood pressure between your right  and left arms. If you have a high blood pressure reading during one visit or you have normal blood pressure with other risk factors, you may be asked to: Return on a different day to have your blood pressure checked again. Monitor your blood pressure at home for 1 week or longer. If you are diagnosed with hypertension, you may have other blood or imaging tests to help your health care provider understand your overall risk for other conditions. How is this treated? This condition is treated by making healthy lifestyle changes, such as eating healthy foods, exercising more, and reducing your alcohol intake. You may be referred for counseling on a healthy diet and physical activity. Your health care provider may prescribe medicine if lifestyle changes are not enough to get your blood pressure under control and if: Your systolic blood pressure is above 130. Your diastolic blood pressure is above 80. Your personal target blood pressure may vary depending on your medical conditions, your age, and other factors. Follow these instructions at home: Eating and drinking  Eat a diet that is high in fiber and potassium, and low in sodium, added sugar, and fat. An example of this eating plan is called the DASH diet. DASH stands for Dietary Approaches to Stop Hypertension. To eat this way: Eat plenty of fresh fruits and vegetables. Try to fill one half of your plate at each meal with fruits and vegetables. Eat whole grains, such as whole-wheat pasta, brown rice, or whole-grain bread. Fill about one fourth of your plate with whole grains. Eat or drink low-fat dairy products, such as skim milk or low-fat yogurt. Avoid fatty cuts of meat, processed or cured meats, and poultry with skin. Fill about one fourth of your plate with lean proteins, such as fish, chicken without skin, beans, eggs, or tofu. Avoid pre-made and processed foods. These tend to be higher in sodium, added sugar, and fat. Reduce your daily sodium  intake. Many people with hypertension should eat less than 1,500 mg of sodium a day. Do not drink alcohol if: Your health care provider tells you not to drink. You are pregnant, may be pregnant, or are planning to become pregnant. If you drink alcohol: Limit how much you have to: 0-1 drink a day for women. 0-2 drinks a day for men. Know how much alcohol is in your drink. In the [place] one drink equals one 12 oz bottle of beer (355 mL), one 5 oz glass of wine (148 mL), or one 1 oz glass of hard liquor (44 mL). Lifestyle  Work with your health care provider to maintain a healthy body weight or to lose weight. Ask what an ideal weight is for you. Get at least 30 minutes of exercise that causes your heart to beat faster (aerobic exercise) most days of the week. Activities may include walking, swimming, or biking. Include exercise to strengthen your muscles (resistance exercise), such as Pilates or lifting weights, as part of your weekly exercise routine. Try to  do these types of exercises for 30 minutes at least 3 days a week. Do not use any products that contain nicotine or tobacco. These products include cigarettes, chewing tobacco, and vaping devices, such as e-cigarettes. If you need help quitting, ask your health care provider. Monitor your blood pressure at home as told by your health care provider. Keep all follow-up visits. This is important. Medicines Take over-the-counter and prescription medicines only as told by your health care provider. Follow directions carefully. Blood pressure medicines must be taken as prescribed. Do not skip doses of blood pressure medicine. Doing this puts you at risk for problems and can make the medicine less effective. Ask your health care provider about side effects or reactions to medicines that you should watch for. Contact a health care provider if you: Think you are having a reaction to a medicine you are taking. Have headaches that keep coming back  (recurring). Feel dizzy. Have swelling in your ankles. Have trouble with your vision. Get help right away if you: Develop a severe headache or confusion. Have unusual weakness or numbness. Feel faint. Have severe pain in your chest or abdomen. Vomit repeatedly. Have trouble breathing. These symptoms may be an emergency. Get help right away. Call 911. Do not wait to see if the symptoms will go away. Do not drive yourself to the hospital. Summary Hypertension is when the force of blood pumping through your arteries is too strong. If this condition is not controlled, it may put you at risk for serious complications. Your personal target blood pressure may vary depending on your medical conditions, your age, and other factors. For most people, a normal blood pressure is less than 120/80. Hypertension is treated with lifestyle changes, medicines, or a combination of both. Lifestyle changes include losing weight, eating a healthy, low-sodium diet, exercising more, and limiting alcohol. This information is not intended to replace advice given to you by your health care provider. Make sure you discuss any questions you have with your health care provider. Document Revised: 01/29/2021 Document Reviewed: 01/29/2021 Elsevier Patient Education  2023 Elsevier Inc.    Edwina Barth, MD [facility]

## 2022-08-05 NOTE — Patient Instructions (Signed)
Hypertension, Adult High blood pressure (hypertension) is when the force of blood pumping through the arteries is too strong. The arteries are the blood vessels that carry blood from the heart throughout the body. Hypertension forces the heart to work harder to pump blood and may cause arteries to become narrow or stiff. Untreated or uncontrolled hypertension can lead to a heart attack, heart failure, a stroke, kidney disease, and other problems. A blood pressure reading consists of a higher number over a lower number. Ideally, your blood pressure should be below 120/80. The first ("top") number is called the systolic pressure. It is a measure of the pressure in your arteries as your heart beats. The second ("bottom") number is called the diastolic pressure. It is a measure of the pressure in your arteries as the heart relaxes. What are the causes? The exact cause of this condition is not known. There are some conditions that result in high blood pressure. What increases the risk? Certain factors may make you more likely to develop high blood pressure. Some of these risk factors are under your control, including: Smoking. Not getting enough exercise or physical activity. Being overweight. Having too much fat, sugar, calories, or salt (sodium) in your diet. Drinking too much alcohol. Other risk factors include: Having a personal history of heart disease, diabetes, high cholesterol, or kidney disease. Stress. Having a family history of high blood pressure and high cholesterol. Having obstructive sleep apnea. Age. The risk increases with age. What are the signs or symptoms? High blood pressure may not cause symptoms. Very high blood pressure (hypertensive crisis) may cause: Headache. Fast or irregular heartbeats (palpitations). Shortness of breath. Nosebleed. Nausea and vomiting. Vision changes. Severe chest pain, dizziness, and seizures. How is this diagnosed? This condition is diagnosed by  measuring your blood pressure while you are seated, with your arm resting on a flat surface, your legs uncrossed, and your feet flat on the floor. The cuff of the blood pressure monitor will be placed directly against the skin of your upper arm at the level of your heart. Blood pressure should be measured at least twice using the same arm. Certain conditions can cause a difference in blood pressure between your right and left arms. If you have a high blood pressure reading during one visit or you have normal blood pressure with other risk factors, you may be asked to: Return on a different day to have your blood pressure checked again. Monitor your blood pressure at home for 1 week or longer. If you are diagnosed with hypertension, you may have other blood or imaging tests to help your health care provider understand your overall risk for other conditions. How is this treated? This condition is treated by making healthy lifestyle changes, such as eating healthy foods, exercising more, and reducing your alcohol intake. You may be referred for counseling on a healthy diet and physical activity. Your health care provider may prescribe medicine if lifestyle changes are not enough to get your blood pressure under control and if: Your systolic blood pressure is above 130. Your diastolic blood pressure is above 80. Your personal target blood pressure may vary depending on your medical conditions, your age, and other factors. Follow these instructions at home: Eating and drinking  Eat a diet that is high in fiber and potassium, and low in sodium, added sugar, and fat. An example of this eating plan is called the DASH diet. DASH stands for Dietary Approaches to Stop Hypertension. To eat this way: Eat   plenty of fresh fruits and vegetables. Try to fill one half of your plate at each meal with fruits and vegetables. Eat whole grains, such as whole-wheat pasta, brown rice, or whole-grain bread. Fill about one  fourth of your plate with whole grains. Eat or drink low-fat dairy products, such as skim milk or low-fat yogurt. Avoid fatty cuts of meat, processed or cured meats, and poultry with skin. Fill about one fourth of your plate with lean proteins, such as fish, chicken without skin, beans, eggs, or tofu. Avoid pre-made and processed foods. These tend to be higher in sodium, added sugar, and fat. Reduce your daily sodium intake. Many people with hypertension should eat less than 1,500 mg of sodium a day. Do not drink alcohol if: Your health care provider tells you not to drink. You are pregnant, may be pregnant, or are planning to become pregnant. If you drink alcohol: Limit how much you have to: 0-1 drink a day for women. 0-2 drinks a day for men. Know how much alcohol is in your drink. In the [place] one drink equals one 12 oz bottle of beer (355 mL), one 5 oz glass of wine (148 mL), or one 1 oz glass of hard liquor (44 mL). Lifestyle  Work with your health care provider to maintain a healthy body weight or to lose weight. Ask what an ideal weight is for you. Get at least 30 minutes of exercise that causes your heart to beat faster (aerobic exercise) most days of the week. Activities may include walking, swimming, or biking. Include exercise to strengthen your muscles (resistance exercise), such as Pilates or lifting weights, as part of your weekly exercise routine. Try to do these types of exercises for 30 minutes at least 3 days a week. Do not use any products that contain nicotine or tobacco. These products include cigarettes, chewing tobacco, and vaping devices, such as e-cigarettes. If you need help quitting, ask your health care provider. Monitor your blood pressure at home as told by your health care provider. Keep all follow-up visits. This is important. Medicines Take over-the-counter and prescription medicines only as told by your health care provider. Follow directions carefully. Blood  pressure medicines must be taken as prescribed. Do not skip doses of blood pressure medicine. Doing this puts you at risk for problems and can make the medicine less effective. Ask your health care provider about side effects or reactions to medicines that you should watch for. Contact a health care provider if you: Think you are having a reaction to a medicine you are taking. Have headaches that keep coming back (recurring). Feel dizzy. Have swelling in your ankles. Have trouble with your vision. Get help right away if you: Develop a severe headache or confusion. Have unusual weakness or numbness. Feel faint. Have severe pain in your chest or abdomen. Vomit repeatedly. Have trouble breathing. These symptoms may be an emergency. Get help right away. Call 911. Do not wait to see if the symptoms will go away. Do not drive yourself to the hospital. Summary Hypertension is when the force of blood pumping through your arteries is too strong. If this condition is not controlled, it may put you at risk for serious complications. Your personal target blood pressure may vary depending on your medical conditions, your age, and other factors. For most people, a normal blood pressure is less than 120/80. Hypertension is treated with lifestyle changes, medicines, or a combination of both. Lifestyle changes include losing weight, eating a healthy,   low-sodium diet, exercising more, and limiting alcohol. This information is not intended to replace advice given to you by your health care provider. Make sure you discuss any questions you have with your health care provider. Document Revised: 01/29/2021 Document Reviewed: 01/29/2021 Elsevier Patient Education  2023 Elsevier Inc.  

## 2022-08-05 NOTE — Assessment & Plan Note (Signed)
Stable and well-controlled.  No concerns. 

## 2022-08-05 NOTE — Assessment & Plan Note (Signed)
Smoking a lot less, in the process of quitting. Cardiovascular/cancer risk associated with smoking discussed. Smoking cessation encouraged.

## 2022-08-05 NOTE — Assessment & Plan Note (Signed)
Elevated blood pressure reading in the office Cardiovascular risks associated with uncontrolled hypertension discussed Dietary approaches to stop hypertension discussed Recommend to stop valsartan 80 and start valsartan HCT 160-12.5 mg daily Advised to monitor blood pressure readings at home daily for the next several weeks and keep a log.  Advised to contact the office if numbers persistently abnormal. Well-controlled diabetes with hemoglobin A1c of 6.1 Diet and nutrition discussed Recommend to continue glipizide 5 mg daily and Farxiga 10 mg daily

## 2022-09-06 ENCOUNTER — Other Ambulatory Visit: Payer: Self-pay | Admitting: Family

## 2022-09-06 DIAGNOSIS — I10 Essential (primary) hypertension: Secondary | ICD-10-CM

## 2022-09-16 ENCOUNTER — Encounter (HOSPITAL_COMMUNITY): Payer: Self-pay

## 2022-09-16 ENCOUNTER — Emergency Department (HOSPITAL_COMMUNITY): Payer: Medicare Other

## 2022-09-16 ENCOUNTER — Observation Stay (HOSPITAL_COMMUNITY): Payer: Medicare Other

## 2022-09-16 ENCOUNTER — Emergency Department (HOSPITAL_BASED_OUTPATIENT_CLINIC_OR_DEPARTMENT_OTHER): Payer: Medicare Other

## 2022-09-16 ENCOUNTER — Observation Stay (HOSPITAL_COMMUNITY)
Admission: EM | Admit: 2022-09-16 | Discharge: 2022-09-18 | Disposition: A | Discharge status: Home / Self Care | Payer: Medicare Other | Attending: Internal Medicine | Admitting: Internal Medicine

## 2022-09-16 ENCOUNTER — Other Ambulatory Visit: Payer: Self-pay

## 2022-09-16 DIAGNOSIS — I10 Essential (primary) hypertension: Secondary | ICD-10-CM | POA: Insufficient documentation

## 2022-09-16 DIAGNOSIS — R200 Anesthesia of skin: Secondary | ICD-10-CM | POA: Diagnosis not present

## 2022-09-16 DIAGNOSIS — Z79899 Other long term (current) drug therapy: Secondary | ICD-10-CM | POA: Insufficient documentation

## 2022-09-16 DIAGNOSIS — R29898 Other symptoms and signs involving the musculoskeletal system: Secondary | ICD-10-CM | POA: Diagnosis not present

## 2022-09-16 DIAGNOSIS — Z7984 Long term (current) use of oral hypoglycemic drugs: Secondary | ICD-10-CM | POA: Diagnosis not present

## 2022-09-16 DIAGNOSIS — I639 Cerebral infarction, unspecified: Secondary | ICD-10-CM | POA: Diagnosis present

## 2022-09-16 DIAGNOSIS — E1169 Type 2 diabetes mellitus with other specified complication: Secondary | ICD-10-CM

## 2022-09-16 DIAGNOSIS — I63541 Cerebral infarction due to unspecified occlusion or stenosis of right cerebellar artery: Principal | ICD-10-CM | POA: Insufficient documentation

## 2022-09-16 DIAGNOSIS — R41841 Cognitive communication deficit: Secondary | ICD-10-CM | POA: Insufficient documentation

## 2022-09-16 DIAGNOSIS — E119 Type 2 diabetes mellitus without complications: Secondary | ICD-10-CM | POA: Diagnosis not present

## 2022-09-16 DIAGNOSIS — I152 Hypertension secondary to endocrine disorders: Secondary | ICD-10-CM | POA: Diagnosis present

## 2022-09-16 DIAGNOSIS — I6389 Other cerebral infarction: Secondary | ICD-10-CM

## 2022-09-16 DIAGNOSIS — F17200 Nicotine dependence, unspecified, uncomplicated: Secondary | ICD-10-CM | POA: Diagnosis present

## 2022-09-16 DIAGNOSIS — R2681 Unsteadiness on feet: Secondary | ICD-10-CM | POA: Insufficient documentation

## 2022-09-16 DIAGNOSIS — R2689 Other abnormalities of gait and mobility: Secondary | ICD-10-CM | POA: Diagnosis not present

## 2022-09-16 DIAGNOSIS — R531 Weakness: Secondary | ICD-10-CM | POA: Diagnosis not present

## 2022-09-16 DIAGNOSIS — F17210 Nicotine dependence, cigarettes, uncomplicated: Secondary | ICD-10-CM | POA: Diagnosis not present

## 2022-09-16 DIAGNOSIS — M6281 Muscle weakness (generalized): Secondary | ICD-10-CM | POA: Insufficient documentation

## 2022-09-16 DIAGNOSIS — R479 Unspecified speech disturbances: Secondary | ICD-10-CM | POA: Insufficient documentation

## 2022-09-16 LAB — COMPREHENSIVE METABOLIC PANEL
ALT: 22 U/L (ref 0–44)
AST: 23 U/L (ref 15–41)
Albumin: 4 g/dL (ref 3.5–5.0)
Alkaline Phosphatase: 52 U/L (ref 38–126)
Anion gap: 14 (ref 5–15)
BUN: 22 mg/dL (ref 8–23)
CO2: 21 mmol/L — ABNORMAL LOW (ref 22–32)
Calcium: 9.5 mg/dL (ref 8.9–10.3)
Chloride: 99 mmol/L (ref 98–111)
Creatinine, Ser: 1.31 mg/dL — ABNORMAL HIGH (ref 0.61–1.24)
GFR, Estimated: 60 mL/min — ABNORMAL LOW (ref >60)
Glucose, Bld: 234 mg/dL — ABNORMAL HIGH (ref 70–99)
Potassium: 3.8 mmol/L (ref 3.5–5.1)
Sodium: 134 mmol/L — ABNORMAL LOW (ref 135–145)
Total Bilirubin: 0.7 mg/dL (ref 0.3–1.2)
Total Protein: 7.2 g/dL (ref 6.5–8.1)

## 2022-09-16 LAB — ECHOCARDIOGRAM COMPLETE
AR max vel: 2.8 cm2
AV Area VTI: 2.53 cm2
AV Area mean vel: 2.69 cm2
AV Mean grad: 3 mmHg
AV Peak grad: 4.8 mmHg
Ao pk vel: 1.09 m/s
Area-P 1/2: 2.37 cm2
Height: 71 in
S' Lateral: 2.8 cm
Weight: 3534.41 oz

## 2022-09-16 LAB — DIFFERENTIAL
Abs Immature Granulocytes: 0.03 10*3/uL (ref 0.00–0.07)
Basophils Absolute: 0 10*3/uL (ref 0.0–0.1)
Basophils Relative: 0 %
Eosinophils Absolute: 0 10*3/uL (ref 0.0–0.5)
Eosinophils Relative: 0 %
Immature Granulocytes: 0 %
Lymphocytes Relative: 37 %
Lymphs Abs: 3 10*3/uL (ref 0.7–4.0)
Monocytes Absolute: 0.7 10*3/uL (ref 0.1–1.0)
Monocytes Relative: 8 %
Neutro Abs: 4.4 10*3/uL (ref 1.7–7.7)
Neutrophils Relative %: 55 %

## 2022-09-16 LAB — CBC
HCT: 48.7 % (ref 39.0–52.0)
Hemoglobin: 16.3 g/dL (ref 13.0–17.0)
MCH: 30.5 pg (ref 26.0–34.0)
MCHC: 33.5 g/dL (ref 30.0–36.0)
MCV: 91 fL (ref 80.0–100.0)
Platelets: 171 10*3/uL (ref 150–400)
RBC: 5.35 MIL/uL (ref 4.22–5.81)
RDW: 13.4 % (ref 11.5–15.5)
WBC: 8.2 10*3/uL (ref 4.0–10.5)
nRBC: 0 % (ref 0.0–0.2)

## 2022-09-16 LAB — RAPID URINE DRUG SCREEN, HOSP PERFORMED
Amphetamines: NOT DETECTED
Barbiturates: NOT DETECTED
Benzodiazepines: NOT DETECTED
Cocaine: NOT DETECTED
Opiates: NOT DETECTED
Tetrahydrocannabinol: NOT DETECTED

## 2022-09-16 LAB — URINALYSIS, ROUTINE W REFLEX MICROSCOPIC
Bacteria, UA: NONE SEEN
Bilirubin Urine: NEGATIVE
Glucose, UA: >=500 mg/dL — AB
Hgb urine dipstick: NEGATIVE
Ketones, ur: NEGATIVE mg/dL
Leukocytes,Ua: NEGATIVE
Nitrite: NEGATIVE
Protein, ur: NEGATIVE mg/dL
Specific Gravity, Urine: 1.022 (ref 1.005–1.030)
pH: 5 (ref 5.0–8.0)

## 2022-09-16 LAB — HIV ANTIBODY (ROUTINE TESTING W REFLEX): HIV Screen 4th Generation wRfx: NONREACTIVE

## 2022-09-16 LAB — I-STAT CHEM 8, ED
BUN: 26 mg/dL — ABNORMAL HIGH (ref 8–23)
Calcium, Ion: 1.16 mmol/L (ref 1.15–1.40)
Chloride: 99 mmol/L (ref 98–111)
Creatinine, Ser: 1.2 mg/dL (ref 0.61–1.24)
Glucose, Bld: 239 mg/dL — ABNORMAL HIGH (ref 70–99)
HCT: 50 % (ref 39.0–52.0)
Hemoglobin: 17 g/dL (ref 13.0–17.0)
Potassium: 3.9 mmol/L (ref 3.5–5.1)
Sodium: 135 mmol/L (ref 135–145)
TCO2: 24 mmol/L (ref 22–32)

## 2022-09-16 LAB — PROTIME-INR
INR: 1 (ref 0.8–1.2)
Prothrombin Time: 13.2 seconds (ref 11.4–15.2)

## 2022-09-16 LAB — ETHANOL: Alcohol, Ethyl (B): <10 mg/dL (ref <10)

## 2022-09-16 LAB — APTT: aPTT: 28 seconds (ref 24–36)

## 2022-09-16 LAB — CBG MONITORING, ED: Glucose-Capillary: 262 mg/dL — ABNORMAL HIGH (ref 70–99)

## 2022-09-16 LAB — GLUCOSE, CAPILLARY
Glucose-Capillary: 129 mg/dL — ABNORMAL HIGH (ref 70–99)
Glucose-Capillary: 194 mg/dL — ABNORMAL HIGH (ref 70–99)
Glucose-Capillary: 146 mg/dL — ABNORMAL HIGH (ref 70–99)

## 2022-09-16 MED ORDER — ONDANSETRON HCL 4 MG PO TABS: 4.0000 mg | ORAL_TABLET | Freq: Four times a day (QID) | ORAL | Status: DC | PRN

## 2022-09-16 MED ORDER — ASPIRIN 81 MG PO CHEW
324.0000 mg | CHEWABLE_TABLET | Freq: Once | ORAL | Status: AC
  Administered 2022-09-16: 324 mg via ORAL
  Filled 2022-09-16: qty 4

## 2022-09-16 MED ORDER — ENOXAPARIN SODIUM 40 MG/0.4ML IJ SOSY
40.0000 mg | PREFILLED_SYRINGE | INTRAMUSCULAR | Status: DC
  Administered 2022-09-16 – 2022-09-18 (×3): 40 mg via SUBCUTANEOUS
  Filled 2022-09-16 (×3): qty 0.4

## 2022-09-16 MED ORDER — CLOPIDOGREL BISULFATE 75 MG PO TABS
75.0000 mg | ORAL_TABLET | Freq: Every day | ORAL | Status: DC
  Administered 2022-09-16 – 2022-09-18 (×3): 75 mg via ORAL
  Filled 2022-09-16 (×3): qty 1

## 2022-09-16 MED ORDER — SODIUM CHLORIDE 0.9% FLUSH
3.0000 mL | Freq: Two times a day (BID) | INTRAVENOUS | Status: DC
  Administered 2022-09-16 – 2022-09-18 (×5): 3 mL via INTRAVENOUS

## 2022-09-16 MED ORDER — ONDANSETRON HCL 4 MG/2ML IJ SOLN: 4.0000 mg | Freq: Four times a day (QID) | INTRAMUSCULAR | Status: DC | PRN

## 2022-09-16 MED ORDER — ACETAMINOPHEN 325 MG PO TABS: 650.0000 mg | ORAL_TABLET | Freq: Four times a day (QID) | ORAL | Status: DC | PRN

## 2022-09-16 MED ORDER — INSULIN ASPART 100 UNIT/ML IJ SOLN: 0.0000 [IU] | Freq: Every day | INTRAMUSCULAR | Status: DC

## 2022-09-16 MED ORDER — ACETAMINOPHEN 650 MG RE SUPP: 650.0000 mg | Freq: Four times a day (QID) | RECTAL | Status: DC | PRN

## 2022-09-16 MED ORDER — GLIPIZIDE ER 5 MG PO TB24
5.0000 mg | ORAL_TABLET | Freq: Every day | ORAL | Status: DC
  Administered 2022-09-17 – 2022-09-18 (×2): 5 mg via ORAL
  Filled 2022-09-16 (×2): qty 1

## 2022-09-16 MED ORDER — DAPAGLIFLOZIN PROPANEDIOL 10 MG PO TABS
10.0000 mg | ORAL_TABLET | Freq: Every day | ORAL | Status: DC
  Administered 2022-09-17 – 2022-09-18 (×2): 10 mg via ORAL
  Filled 2022-09-16 (×2): qty 1

## 2022-09-16 MED ORDER — IOHEXOL 350 MG/ML SOLN
75.0000 mL | Freq: Once | INTRAVENOUS | Status: AC | PRN
  Administered 2022-09-16: 75 mL via INTRAVENOUS

## 2022-09-16 MED ORDER — STROKE: EARLY STAGES OF RECOVERY BOOK
Freq: Once | Status: AC
  Filled 2022-09-16: qty 1

## 2022-09-16 MED ORDER — INSULIN ASPART 100 UNIT/ML IJ SOLN
0.0000 [IU] | Freq: Three times a day (TID) | INTRAMUSCULAR | Status: DC
  Administered 2022-09-16 – 2022-09-17 (×2): 3 [IU] via SUBCUTANEOUS
  Administered 2022-09-17 – 2022-09-18 (×4): 2 [IU] via SUBCUTANEOUS

## 2022-09-16 MED ORDER — INSULIN ASPART 100 UNIT/ML IJ SOLN
0.0000 [IU] | INTRAMUSCULAR | Status: DC
  Administered 2022-09-16: 1 [IU] via SUBCUTANEOUS

## 2022-09-16 MED ORDER — ASPIRIN 81 MG PO TBEC
81.0000 mg | DELAYED_RELEASE_TABLET | Freq: Every day | ORAL | Status: DC
  Administered 2022-09-17 – 2022-09-18 (×2): 81 mg via ORAL
  Filled 2022-09-16 (×3): qty 1

## 2022-09-16 MED ORDER — ROSUVASTATIN CALCIUM 20 MG PO TABS
20.0000 mg | ORAL_TABLET | Freq: Every day | ORAL | Status: DC
  Administered 2022-09-16 – 2022-09-17 (×2): 20 mg via ORAL
  Filled 2022-09-16 (×2): qty 1

## 2022-09-16 MED ORDER — NICOTINE 14 MG/24HR TD PT24
14.0000 mg | MEDICATED_PATCH | Freq: Every day | TRANSDERMAL | Status: DC
  Administered 2022-09-16 – 2022-09-18 (×3): 14 mg via TRANSDERMAL
  Filled 2022-09-16 (×3): qty 1

## 2022-09-16 NOTE — ED Provider Notes (Signed)
7:17 AM Care assumed from Dr. Madilyn Hook.  At time of transfer of care, patient is awaiting results of MRI to look for stroke as cause of right-sided arm numbness and weakness, some vision changes, and some possible instability with listing to the right since yesterday.  Plan of care will be to see the MRI results and then disposition accordingly.  If it shows stroke, will consult neurology and anticipate admission.  No history of previous stroke per previous team report.  8:24 AM MRI just returned showing evidence of acute stroke.  Will call neurology and anticipate admission.  8:40 AM Just spoke to neurology who will come see patient.  They requested a full dose aspirin, CTA head and neck, and admission to hospitalist service for further workup of acute stroke.  Clinical Impression: 1. Cerebrovascular accident (CVA), unspecified mechanism (HCC)     Disposition: Admit  This note was prepared with assistance of Dragon voice recognition software. Occasional wrong-word or sound-a-like substitutions may have occurred due to the inherent limitations of voice recognition software.     Anais Denslow, Canary Brim, MD 09/16/22 1352

## 2022-09-16 NOTE — H&P (Signed)
History and Physical    Patient: Nathaniel Waters JWJ:[id number] DOB: 1955-04-17 DOA: 09/16/2022 DOS: the patient was seen and examined on 09/16/2022 PCP: Georgina Quint, MD  Patient coming from: Home  Chief Complaint:  Chief Complaint  Patient presents with   Extremity Weakness   HPI: Nathaniel Waters is a 67 y.o. male with medical history significant of tobacco abuse, reflux, diabetes mellitus 2 not on insulin, dyslipidemia, hypertension, chronic hep C previously treated with medications at our local ID clinic, and BPH no longer on medications.  According to the notes in the chart patient woke up after a nap around 1 PM and noticed numbness and weakness of the right arm.  He is soon he had just slept funny and had developed numbness from positional issues.  He developed a severe headache today and then noticed he was having some ataxia and balance issues without any vertigo symptoms.  Because of the symptoms he presented by private vehicle to the ED for further evaluation.  Patient reports that prior to presentation today he was hungry and had eaten a bowl of cereal although he was having difficulty eating the cereal because of weakness and numbness in the right upper extremity.  When glucose was checked upon arrival it was 262.  Patient states his usual glucose readings are between 101 30.  In the ED he was afebrile and mildly hypertensive with a blood pressure of 149/77, mildly tachypneic with a normal heart rate.  Room air sats were 95%.  Stat CT of the head without contrast showed no infarct or intracranial hemorrhage.  MRI brain without contrast revealed punctate acute infarctions affecting the right posterior lateral medulla and the inferior right cerebellum without swelling or hemorrhage.  Based on the MRI images there appears to be flow in the major posterior circulation vessels.  He has been evaluated by the neurological team who have recommended admission for acute CVA.  Hospitalist service  will be admitting the team for medical evaluation.  Upon my evaluation of the patient the neurological team was at the bedside performing their evaluation.  Please see physical exam.  Patient continues to have issues with headache and dizziness especially upon standing.  Denies vertigo type symptoms.   Review of Systems: As mentioned in the history of present illness. All other systems reviewed and are negative. Past Medical History:  Diagnosis Date   Diabetes mellitus    Diabetes mellitus without complication (HCC)    GERD (gastroesophageal reflux disease)    Hyperlipidemia    Hypertension    Pneumonia    Past Surgical History:  Procedure Laterality Date   CARPAL TUNNEL RELEASE     both    COLONOSCOPY  2018   TRANSURETHRAL RESECTION OF PROSTATE N/A 09/27/2021   Procedure: TRANSURETHRAL RESECTION OF THE PROSTATE (TURP), BIPOLAR;  Surgeon: Jannifer Hick, MD;  Location: [facility];  Service: Urology;  Laterality: N/A;   Social History:  reports that he has been smoking cigarettes. He has been smoking an average of .25 packs per day. He has never used smokeless tobacco. He reports current alcohol use. He reports that he does not use drugs.  Allergies  Allergen Reactions   Metformin And Related Diarrhea   Jardiance [Empagliflozin] Rash    Family History  Problem Relation Age of Onset   Breast cancer Mother    Heart disease Brother    Lung cancer Father    Healthy Paternal Grandmother    Cancer Brother    Colon cancer Neg  Hx    Esophageal cancer Neg Hx    Stomach cancer Neg Hx    Rectal cancer Neg Hx    Colon polyps Neg Hx     Prior to Admission medications   Medication Sig Start Date End Date Taking? Authorizing Provider  valsartan (DIOVAN) 80 MG tablet Take 80 mg by mouth daily. 08/29/22  Yes [provider]  docusate sodium (COLACE) 100 MG capsule Take 1 capsule (100 mg total) by mouth daily as needed for up to 30 doses. 09/27/21   Jannifer Hick, MD  FARXIGA 10 MG  TABS tablet TAKE 1 TABLET BY MOUTH EVERY DAY 07/21/22   Georgina Quint, MD  finasteride (PROSCAR) 5 MG tablet Take 5 mg by mouth in the morning. Patient not taking: Reported on 08/05/2022    [provider]  glipiZIDE (GLUCOTROL XL) 5 MG 24 hr tablet TAKE 1 TABLET BY MOUTH EVERY DAY WITH BREAKFAST 04/13/22   Georgina Quint, MD  Lancets (ACCU-CHEK MULTICLIX) lancets Use to help check blood sugars twice a day 05/12/17   Meryl Dare, NP  rosuvastatin (CRESTOR) 20 MG tablet TAKE 1 TABLET BY MOUTH EVERY DAY 07/06/22   Georgina Quint, MD  tamsulosin (FLOMAX) 0.4 MG CAPS capsule Take 1 capsule (0.4 mg total) by mouth daily. Patient not taking: Reported on 08/05/2022 07/09/21   Olive Bass, FNP  Tetrahydroz-Dextran-PEG-Povid [facility] ADVANCED RELIEF) 0.05-0.1-1-1 % SOLN Place 1-2 drops into both eyes 3 (three) times daily as needed (itchy/red/irritated eyes.).    [provider]  valsartan-hydrochlorothiazide (DIOVAN-HCT) 160-12.5 MG tablet Take 1 tablet by mouth daily. 08/05/22   Georgina Quint, MD    Physical Exam: Vitals:   09/16/22 0945 09/16/22 0955 09/16/22 1049 09/16/22 1050  BP: 127/80     Pulse: 73 77    Resp:      Temp:      TempSrc:      SpO2: 95% 95% 95% 97%  Weight:      Height:       Constitutional: NAD, calm, comfortable Eyes: PERRL, lids and conjunctivae normal ENMT: Mucous membranes are moist. Posterior pharynx clear of any exudate or lesions.Normal dentition.  Neck: normal, supple, no masses, no thyromegaly Respiratory: clear to auscultation bilaterally, no wheezing, no crackles. Normal respiratory effort. No accessory muscle use.  Cardiovascular: Regular rate and rhythm, no murmurs / rubs / gallops. No extremity edema. 2+ pedal pulses. No carotid bruits.  Abdomen: no tenderness, no masses palpated. No hepatosplenomegaly. Bowel sounds positive.  Musculoskeletal: no clubbing / cyanosis. No joint deformity upper and  lower extremities. Good ROM, no contractures. Normal muscle tone.  Skin: no rashes, lesions, ulcers. No induration Neurologic: CN 2-12 grossly intact although he did have some mild subtle lateral nystagmus when tested. Sensation intact except for persistent right hand numbness both on the dorsal and palmar aspects., DTR normal. Strength 5/5 x all 4 extremities.  No drift.  Patient did have some mild apraxia with right finger-to-nose testing.  Given reports of significant ataxia with drift to the right balance and walking was not tested.  Shin to heel was also intact Psychiatric: Normal judgment and insight. Alert and oriented x 3. Normal mood.    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 Unilateral but still could be related to embolic source-echocardiogram pending Routine stroke workup to include CTA of head/neck, echocardiogram, hemoglobin A1c and lipid panel, SLP/PT/OT evaluation If passes swallowing evaluation will allow diet Anticipate discharge home if remains stable and workup otherwise unrevealing Permissive hypertension keeping systolic blood pressure greater than or equal to 160 DAPT with Plavix and baby aspirin-duration of therapy at discretion of neurology team pending additional workup  Diabetes mellitus 2 Current CBGs greater than 200 with pseudohyponatremia with borderline glucose induced acidemia CBGs every 4 hours until allowed to eat and can be AC/HS Once diet resumed will resume home  medications in a.m. (Farxiga and Glucotrol) Hemoglobin A1c pending  Hypertension Holding home medications for now in context of allowing for permissive hypertension  HLD Resume statin-currently on Crestor but neurology team may prefer to change to Lipitor at max dose of 80 mg  Chronic hepatitis C Remote treatment at [facility] ID clinic-current LFTs are normal  BPH Patient currently off pharmacotherapy  Tobacco abuse Patient counseled regarding cessation Slow taper down recommended Nicotine patch     Advance Care Planning:   Code Status: Full Code   VTE prophylaxis: Lovenox noting no bleeding noted on either CT head or MRI brain  Consults: Neurology  Family Communication: Patient only  Severity of Illness: The appropriate patient status for this patient is OBSERVATION. Observation status is judged to be reasonable and necessary in order to provide the required intensity of service to ensure the patient's safety. The patient's presenting symptoms, physical exam findings, and initial radiographic and laboratory data in the context of their medical condition is felt to place them at decreased risk for further clinical deterioration. Furthermore, it is anticipated that the patient will be medically stable for discharge from the hospital within 2 midnights of admission.   Author: Junious Silk, NP 09/16/2022 11:12 AM  For on call review [email or web address].

## 2022-09-16 NOTE — ED Notes (Signed)
..ED TO INPATIENT HANDOFF REPORT  ED Nurse Name and Phone #: [phone]  S Name/Age/Gender Nathaniel Waters 67 y.o. male Room/Bed: 039C/039C  Code Status   Code Status: Full Code  Home/SNF/Other Home Patient oriented to: self, place, time, and situation Is this baseline? Yes   Triage Complete: Triage complete  Chief Complaint Acute CVA (cerebrovascular accident) [facility]) [I63.9]  Triage Note Pt reports he noticed his right hand go numb yesterday with increased weakness in his right arm. Pt states LSN between 1-2pm. Pt also reports headache and vision changes in both eyes, pt unable to describe vision changes. Pt also reports balance issues. Upon exam, no drift noted in right arm but decreased drip strength noted.   Allergies Allergies  Allergen Reactions   Metformin And Related Diarrhea   Jardiance [Empagliflozin] Rash    Level of Care/Admitting Diagnosis ED Disposition     ED Disposition  Admit   Condition  --   Comment  Hospital [facility] [[id number]]  Level of Care: Telemetry Cardiac [[id number]]  May place patient in observation at [facility] or [facility] if equivalent level of care is available:: No  Covid Evaluation: Asymptomatic - no recent exposure (last 10 days) testing not required  Diagnosis: Acute CVA (cerebrovascular accident) [facility]) [[id number]]  Admitting Physician: Almon Hercules [[id number]]  Attending Physician: Almon Hercules [[id number]]          B Medical/Surgery History Past Medical History:  Diagnosis Date   Diabetes mellitus    Diabetes mellitus without complication (HCC)    GERD (gastroesophageal reflux disease)    Hyperlipidemia    Hypertension    Pneumonia    Past Surgical History:  Procedure Laterality Date   CARPAL TUNNEL RELEASE     both    COLONOSCOPY  2018   TRANSURETHRAL RESECTION OF PROSTATE N/A 09/27/2021   Procedure: TRANSURETHRAL RESECTION OF THE PROSTATE (TURP), BIPOLAR;  Surgeon: Jannifer Hick, MD;  Location: [facility];   Service: Urology;  Laterality: N/A;     A IV Location/Drains/Wounds Patient Lines/Drains/Airways Status     Active Line/Drains/Airways     Name Placement date Placement time Site Days   Peripheral IV 09/16/22 18 G Anterior;Left;Proximal Forearm 09/16/22  0538  Forearm  less than 1            Intake/Output Last 24 hours No intake or output data in the 24 hours ending 09/16/22 1019  Labs/Imaging Results for orders placed or performed during the hospital encounter of 09/16/22 (from the past 48 hour(s))  CBG monitoring, ED     Status: Abnormal   Collection Time: 09/16/22  5:35 AM  Result Value Ref Range   Glucose-Capillary 262 (H) 70 - 99 mg/dL    Comment: Glucose reference range applies only to samples taken after fasting for at least 8 hours.  I-stat chem 8, ED     Status: Abnormal   Collection Time: 09/16/22  5:43 AM  Result Value Ref Range   Sodium 135 135 - 145 mmol/L   Potassium 3.9 3.5 - 5.1 mmol/L   Chloride 99 98 - 111 mmol/L   BUN 26 (H) 8 - 23 mg/dL   Creatinine, Ser 6.96 0.61 - 1.24 mg/dL   Glucose, Bld 295 (H) 70 - 99 mg/dL    Comment: Glucose reference range applies only to samples taken after fasting for at least 8 hours.   Calcium, Ion 1.16 1.15 - 1.40 mmol/L   TCO2 24 22 - 32 mmol/L  Hemoglobin 17.0 13.0 - 17.0 g/dL   HCT 16.1 09.6 - 04.5 %  Ethanol     Status: None   Collection Time: 09/16/22  5:48 AM  Result Value Ref Range   Alcohol, Ethyl (B) <10 <10 mg/dL    Comment: (NOTE) Lowest detectable limit for serum alcohol is 10 mg/dL.  For medical purposes only. Performed at [facility], [address] [place], [place] [address]   Protime-INR     Status: None   Collection Time: 09/16/22  5:48 AM  Result Value Ref Range   Prothrombin Time 13.2 11.4 - 15.2 seconds   INR 1.0 0.8 - 1.2    Comment: (NOTE) INR goal varies based on device and disease states. Performed at [facility], [address], [place], [place] [address]   APTT      Status: None   Collection Time: 09/16/22  5:48 AM  Result Value Ref Range   aPTT 28 24 - 36 seconds    Comment: Performed at [facility], [address] [place], [place] [address]  CBC     Status: None   Collection Time: 09/16/22  5:48 AM  Result Value Ref Range   WBC 8.2 4.0 - 10.5 K/uL   RBC 5.35 4.22 - 5.81 MIL/uL   Hemoglobin 16.3 13.0 - 17.0 g/dL   HCT 21.3 08.6 - 57.8 %   MCV 91.0 80.0 - 100.0 fL   MCH 30.5 26.0 - 34.0 pg   MCHC 33.5 30.0 - 36.0 g/dL   RDW 46.9 62.9 - 52.8 %   Platelets 171 150 - 400 K/uL   nRBC 0.0 0.0 - 0.2 %    Comment: Performed at [facility], [address], [place], [place] [address]  Differential     Status: None   Collection Time: 09/16/22  5:48 AM  Result Value Ref Range   Neutrophils Relative % 55 %   Neutro Abs 4.4 1.7 - 7.7 K/uL   Lymphocytes Relative 37 %   Lymphs Abs 3.0 0.7 - 4.0 K/uL   Monocytes Relative 8 %   Monocytes Absolute 0.7 0.1 - 1.0 K/uL   Eosinophils Relative 0 %   Eosinophils Absolute 0.0 0.0 - 0.5 K/uL   Basophils Relative 0 %   Basophils Absolute 0.0 0.0 - 0.1 K/uL   Immature Granulocytes 0 %   Abs Immature Granulocytes 0.03 0.00 - 0.07 K/uL    Comment: Performed at [facility], [address], [place], [place] [address]  Comprehensive metabolic panel     Status: Abnormal   Collection Time: 09/16/22  5:48 AM  Result Value Ref Range   Sodium 134 (L) 135 - 145 mmol/L   Potassium 3.8 3.5 - 5.1 mmol/L   Chloride 99 98 - 111 mmol/L   CO2 21 (L) 22 - 32 mmol/L   Glucose, Bld 234 (H) 70 - 99 mg/dL    Comment: Glucose reference range applies only to samples taken after fasting for at least 8 hours.   BUN 22 8 - 23 mg/dL   Creatinine, Ser 7.25 (H) 0.61 - 1.24 mg/dL   Calcium 9.5 8.9 - 36.6 mg/dL   Total Protein 7.2 6.5 - 8.1 g/dL   Albumin 4.0 3.5 - 5.0 g/dL   AST 23 15 - 41 U/L   ALT 22 0 - 44 U/L   Alkaline Phosphatase 52 38 - 126 U/L   Total Bilirubin 0.7 0.3 - 1.2 mg/dL  GFR, Estimated 60  (L) >60 mL/min    Comment: (NOTE) Calculated using the CKD-EPI Creatinine Equation (2021)    Anion gap 14 5 - 15    Comment: Performed at [facility], [address], [place], [place] [address]  Urinalysis, Routine w reflex microscopic -Urine, Clean Catch     Status: Abnormal   Collection Time: 09/16/22  9:40 AM  Result Value Ref Range   Color, Urine STRAW (A) YELLOW   APPearance CLEAR CLEAR   Specific Gravity, Urine 1.022 1.005 - 1.030   pH 5.0 5.0 - 8.0   Glucose, UA >=500 (A) NEGATIVE mg/dL   Hgb urine dipstick NEGATIVE NEGATIVE   Bilirubin Urine NEGATIVE NEGATIVE   Ketones, ur NEGATIVE NEGATIVE mg/dL   Protein, ur NEGATIVE NEGATIVE mg/dL   Nitrite NEGATIVE NEGATIVE   Leukocytes,Ua NEGATIVE NEGATIVE   RBC / HPF 0-5 0 - 5 RBC/hpf   WBC, UA 0-5 0 - 5 WBC/hpf   Bacteria, UA NONE SEEN NONE SEEN   Squamous Epithelial / HPF 0-5 0 - 5 /HPF    Comment: Performed at [facility], [address], [place], [place] [address]   MR Brain Wo Contrast (neuro protocol)  Result Date: 09/16/2022 CLINICAL DATA:  Neuro deficit, acute, stroke suspected. Right-sided weakness. Speech disturbance. Numbness of the right hand. EXAM: MRI HEAD WITHOUT CONTRAST TECHNIQUE: Multiplanar, multiecho pulse sequences of the brain and surrounding structures were obtained without intravenous contrast. COMPARISON:  Head CT earlier same day FINDINGS: Brain: Punctate acute infarction of the right posterolateral medulla. Punctate acute infarction in the inferior right cerebellum. No other acute finding. Otherwise, abnormality is seen affecting the brainstem or cerebellum. Cerebral hemispheres show minimal small vessel change of the deep white matter no cortical or large vessel territory infarction. No mass, hemorrhage, hydrocephalus or extra-axial collection. Vascular: Major vessels at the base of the brain show flow. Skull and upper cervical spine: Negative Sinuses/Orbits: Clear/normal Other: None IMPRESSION:  Punctate acute infarctions affecting the right posterolateral medulla and the inferior right cerebellum. No swelling or hemorrhage. Minimal small vessel change of the hemispheric white matter. On the basis of the standard brain MR images, there does appear to be flow in major posterior circulation vessels. Electronically Signed   By: Paulina Fusi M.D.   On: 09/16/2022 08:18   CT HEAD WO CONTRAST  Result Date: 09/16/2022 CLINICAL DATA:  67 year old male with right side weakness and abnormal speech last known well yesterday morning. EXAM: CT HEAD WITHOUT CONTRAST TECHNIQUE: Contiguous axial images were obtained from the base of the skull through the vertex without intravenous contrast. RADIATION DOSE REDUCTION: This exam was performed according to the departmental dose-optimization program which includes automated exposure control, adjustment of the mA and/or kV according to patient size and/or use of iterative reconstruction technique. COMPARISON:  None Available. FINDINGS: Brain: No midline shift, mass effect, or evidence of intracranial mass lesion. No acute intracranial hemorrhage identified. No ventriculomegaly. Gray-white differentiation appears symmetric and within normal limits for age. No cortically based acute infarct identified. ASPECTS 10. No encephalomalacia identified. Vascular: Calcified atherosclerosis at the skull base. No suspicious intracranial vascular hyperdensity. Skull: Negative. Sinuses/Orbits: Visualized paranasal sinuses and mastoids are clear. Other: Visualized orbits and scalp soft tissues are within normal limits. IMPRESSION: Normal for age noncontrast CT appearance of the brain. No cortically based infarct or intracranial hemorrhage identified. Electronically Signed   By: Odessa Fleming M.D.   On: 09/16/2022 06:46    Pending Labs [employer] (From  admission, onward)     Start     Ordered   09/23/22 0500  Creatinine, serum  (enoxaparin (LOVENOX)    CrCl >/= 30 ml/min)  Weekly,   R      Comments: while on enoxaparin therapy    09/16/22 0939   09/17/22 0500  Lipid panel  (Labs)  Tomorrow morning,   R       Comments: Fasting    09/16/22 0853   09/17/22 0500  Basic metabolic panel  Tomorrow morning,   R        09/16/22 0939   09/16/22 0938  HIV Antibody (routine testing w rflx)  (HIV Antibody (Routine testing w reflex) panel)  Once,   R        09/16/22 0939   09/16/22 0938  CBC  (enoxaparin (LOVENOX)    CrCl >/= 30 ml/min)  Once,   R       Comments: Baseline for enoxaparin therapy IF NOT ALREADY DRAWN.  Notify MD if PLT < 100 K.    09/16/22 0939   09/16/22 0938  Creatinine, serum  (enoxaparin (LOVENOX)    CrCl >/= 30 ml/min)  Once,   R       Comments: Baseline for enoxaparin therapy IF NOT ALREADY DRAWN.    09/16/22 0939   09/16/22 0543  Urine rapid drug screen (hosp performed)  Once,   STAT        09/16/22 0542            Vitals/Pain Today's Vitals   09/16/22 0845 09/16/22 0915 09/16/22 0945 09/16/22 0955  BP: (!) 133/92 134/84 127/80   Pulse: 73 74 73 77  Resp: 15 15    Temp:      TempSrc:      SpO2: 98% 97% 95% 95%  Weight:      Height:      PainSc:        Isolation Precautions No active isolations  Medications Medications   stroke: early stages of recovery book (has no administration in time range)  enoxaparin (LOVENOX) injection 40 mg (has no administration in time range)  sodium chloride flush (NS) 0.9 % injection 3 mL (has no administration in time range)  acetaminophen (TYLENOL) tablet 650 mg (has no administration in time range)    Or  acetaminophen (TYLENOL) suppository 650 mg (has no administration in time range)  ondansetron (ZOFRAN) tablet 4 mg (has no administration in time range)    Or  ondansetron (ZOFRAN) injection 4 mg (has no administration in time range)  nicotine (NICODERM CQ - dosed in mg/24 hours) patch 14 mg (has no administration in time range)  rosuvastatin (CRESTOR) tablet 20 mg (has no administration in time range)   dapagliflozin propanediol (FARXIGA) tablet 10 mg (has no administration in time range)  glipiZIDE (GLUCOTROL XL) 24 hr tablet 5 mg (has no administration in time range)  insulin aspart (novoLOG) injection 0-9 Units (has no administration in time range)  aspirin EC tablet 81 mg (has no administration in time range)  clopidogrel (PLAVIX) tablet 75 mg (has no administration in time range)  aspirin chewable tablet 324 mg (324 mg Oral Given 09/16/22 0935)    Mobility walks     Focused Assessments Neuro Assessment Handoff:  Swallow screen pass? Yes    NIH Stroke Scale  Dizziness Present: Yes Headache Present: No Interval: Shift assessment Level of Consciousness (1a.)   : Alert, keenly responsive LOC Questions (1b. )   : Answers both questions  correctly LOC Commands (1c. )   : Performs both tasks correctly Best Gaze (2. )  : Normal Visual (3. )  : No visual loss Facial Palsy (4. )    : Normal symmetrical movements Motor Arm, Left (5a. )   : No drift Motor Arm, Right (5b. ) : No drift Motor Leg, Left (6a. )  : No drift Motor Leg, Right (6b. ) : No drift Limb Ataxia (7. ): Absent Sensory (8. )  : Mild-to-moderate sensory loss, patient feels pinprick is less sharp or is dull on the affected side, or there is a loss of superficial pain with pinprick, but patient is aware of being touched Best Language (9. )  : No aphasia Dysarthria (10. ): Normal Extinction/Inattention (11.)   : No Abnormality Complete NIHSS TOTAL: 1     Neuro Assessment: Exceptions to WDL Neuro Checks:   Initial (09/16/22 0542)  Has TPA been given? No If patient is a Neuro Trauma and patient is going to OR before floor call report to 4N Charge nurse: [phone] or [phone]   R Recommendations: See Admitting Provider Note  Report given to:   Additional Notes: Pt Aox4, NIH 1 for dizziness and HA. Q2 neuro checks. Ambulatory

## 2022-09-16 NOTE — ED Provider Notes (Signed)
[place] EMERGENCY DEPARTMENT AT [facility] Provider Note   CSN: [id number] Arrival date & time: 09/16/22  4098     History  Chief Complaint  Patient presents with   Extremity Weakness    Teyton Pattillo is a 67 y.o. male.  The history is provided by the patient and medical records.  Extremity Weakness  Yandiel Bergum is a 67 y.o. male who presents to the Emergency Department complaining of weakness.  He presents to the emergency department for evaluation of right upper extremity weakness that started when he woke up from a nap around 1 PM yesterday.  He states he was in his routine state of health when he woke up yesterday morning and was able to run errands without difficulty.  At first he thought that he was numb and weak in his right arm from sleeping funny but the symptoms have persisted.  He also reports falling to the side on the right side.  He feels like his vision is different but is unable to articulate why it is different or how it is different.  He also states he has speech changes.  He has a history of hypertension and diabetes.  No prior similar symptoms.     Home Medications Prior to Admission medications   Medication Sig Start Date End Date Taking? Authorizing Provider  ACCU-CHEK GUIDE test strip TEST UP TO 4 TIMES A DAY 09/07/22   Worthy Rancher B, FNP  blood glucose meter kit and supplies KIT Dispense based on patient and insurance preference. Use up to four times daily as directed. (FOR ICD-9 250.00, 250.01). 04/24/20   Olive Bass, FNP  Blood Glucose Monitoring Suppl (ACCU-CHEK AVIVA PLUS) w/Device KIT Use to check blood sugars twice a day 05/12/17   Meryl Dare, NP  docusate sodium (COLACE) 100 MG capsule Take 1 capsule (100 mg total) by mouth daily as needed for up to 30 doses. 09/27/21   Jannifer Hick, MD  FARXIGA 10 MG TABS tablet TAKE 1 TABLET BY MOUTH EVERY DAY 07/21/22   Georgina Quint, MD  finasteride (PROSCAR) 5 MG tablet  Take 5 mg by mouth in the morning. Patient not taking: Reported on 08/05/2022    [provider]  glipiZIDE (GLUCOTROL XL) 5 MG 24 hr tablet TAKE 1 TABLET BY MOUTH EVERY DAY WITH BREAKFAST 04/13/22   Georgina Quint, MD  Lancets (ACCU-CHEK MULTICLIX) lancets Use to help check blood sugars twice a day 05/12/17   Meryl Dare, NP  rosuvastatin (CRESTOR) 20 MG tablet TAKE 1 TABLET BY MOUTH EVERY DAY 07/06/22   Georgina Quint, MD  tamsulosin (FLOMAX) 0.4 MG CAPS capsule Take 1 capsule (0.4 mg total) by mouth daily. Patient not taking: Reported on 08/05/2022 07/09/21   Olive Bass, FNP  Tetrahydroz-Dextran-PEG-Povid [facility] ADVANCED RELIEF) 0.05-0.1-1-1 % SOLN Place 1-2 drops into both eyes 3 (three) times daily as needed (itchy/red/irritated eyes.).    [provider]  valsartan-hydrochlorothiazide (DIOVAN-HCT) 160-12.5 MG tablet Take 1 tablet by mouth daily. 08/05/22   Georgina Quint, MD      Allergies    Metformin and related and Jardiance [empagliflozin]    Review of Systems   Review of Systems  Musculoskeletal:  Positive for extremity weakness.  All other systems reviewed and are negative.   Physical Exam Updated Vital Signs BP (!) 143/84   Pulse 77   Temp 98.2 F (36.8 C)   Resp (!) 21   Ht 5\' 11"  (1.803  m)   Wt 99.8 kg   SpO2 97%   BMI 30.68 kg/m  Physical Exam Vitals and nursing note reviewed.  Constitutional:      Appearance: He is well-developed.  HENT:     Head: Normocephalic and atraumatic.  Cardiovascular:     Rate and Rhythm: Regular rhythm. Tachycardia present.  Pulmonary:     Effort: Pulmonary effort is normal. No respiratory distress.  Abdominal:     Palpations: Abdomen is soft.     Tenderness: There is no abdominal tenderness. There is no guarding or rebound.  Musculoskeletal:        General: No tenderness.  Skin:    General: Skin is warm and dry.  Neurological:     Mental Status: He is alert and  oriented to person, place, and time.     Comments: No asymmetry of facial movements.  Visual fields grossly intact.  There is mild right upper extremity drift.  He also has ataxic gait.  Mild right upper extremity weakness on strength testing.  5 out of 5 strength in left upper extremity, bilateral lower extremities.  Psychiatric:        Behavior: Behavior normal.     ED Results / Procedures / Treatments   Labs (all labs ordered are listed, but only abnormal results are displayed) Labs Reviewed  COMPREHENSIVE METABOLIC PANEL - Abnormal; Notable for the following components:      Result Value   Sodium 134 (*)    CO2 21 (*)    Glucose, Bld 234 (*)    Creatinine, Ser 1.31 (*)    GFR, Estimated 60 (*)    All other components within normal limits  CBG MONITORING, ED - Abnormal; Notable for the following components:   Glucose-Capillary 262 (*)    All other components within normal limits  I-STAT CHEM 8, ED - Abnormal; Notable for the following components:   BUN 26 (*)    Glucose, Bld 239 (*)    All other components within normal limits  ETHANOL  PROTIME-INR  APTT  CBC  DIFFERENTIAL  RAPID URINE DRUG SCREEN, HOSP PERFORMED  URINALYSIS, ROUTINE W REFLEX MICROSCOPIC    EKG EKG Interpretation  Date/Time:  Tuesday September 16 2022 05:32:10 EDT Ventricular Rate:  101 PR Interval:  164 QRS Duration: 90 QT Interval:  352 QTC Calculation: 456 R Axis:   28 Text Interpretation: Sinus tachycardia Otherwise normal ECG Confirmed by Tilden Fossa [id number] on 09/16/2022 6:03:30 AM  Radiology CT HEAD WO CONTRAST  Result Date: 09/16/2022 CLINICAL DATA:  67 year old male with right side weakness and abnormal speech last known well yesterday morning. EXAM: CT HEAD WITHOUT CONTRAST TECHNIQUE: Contiguous axial images were obtained from the base of the skull through the vertex without intravenous contrast. RADIATION DOSE REDUCTION: This exam was performed according to the departmental  dose-optimization program which includes automated exposure control, adjustment of the mA and/or kV according to patient size and/or use of iterative reconstruction technique. COMPARISON:  None Available. FINDINGS: Brain: No midline shift, mass effect, or evidence of intracranial mass lesion. No acute intracranial hemorrhage identified. No ventriculomegaly. Gray-white differentiation appears symmetric and within normal limits for age. No cortically based acute infarct identified. ASPECTS 10. No encephalomalacia identified. Vascular: Calcified atherosclerosis at the skull base. No suspicious intracranial vascular hyperdensity. Skull: Negative. Sinuses/Orbits: Visualized paranasal sinuses and mastoids are clear. Other: Visualized orbits and scalp soft tissues are within normal limits. IMPRESSION: Normal for age noncontrast CT appearance of the brain. No cortically based infarct or  intracranial hemorrhage identified. Electronically Signed   By: Odessa Fleming M.D.   On: 09/16/2022 06:46    Procedures Procedures    Medications Ordered in ED Medications - No data to display  ED Course/ Medical Decision Making/ A&P                             Medical Decision Making Amount and/or Complexity of Data Reviewed Labs: ordered. Radiology: ordered.   Patient with history of hypertension and diabetes here for evaluation of right-sided weakness and gait difficulties that started yesterday.  He does have right upper extremity weakness, falls to the right on walking.  CT head is negative for acute abnormality.  He is LVO negative.  Plan to obtain MRI to evaluate for subacute stroke.  Patient care transferred pending MRI.        Final Clinical Impression(s) / ED Diagnoses Final diagnoses:  None    Rx / DC Orders ED Discharge Orders     None         Tilden Fossa, MD 09/16/22 [phone]

## 2022-09-16 NOTE — Consult Note (Signed)
Neurology Consultation  Reason for Consult: Stroke on MRI Referring Physician: Tegeler  CC:  History is obtained from: patient and past medical records  HPI: Nathaniel Waters is a 67 y.o. male with a past medical history of HTN, diabetes, HLD, GERD presenting with right hand numbness and weakness, headache, vision changes, and balance issues. He woke up yesterday in his usual state of health and then took a nap around 1pm. When he woke up, he felt like his right arm/hand were asleep and he assumed he had just slept funny. He denies a headache yesterday but has since developed one today. He does still have the "sleep" sensation in his right hand, a 7/10 headache, and some balance difficulties. He does endorse smoking cigarettes, occasional alcohol use, he is compliant with his prescribed medication. He does not currently take a baby aspirin.    LKW: 6/10 1pm   ROS: Full ROS was performed and is negative except as noted in the HPI.   Past Medical History:  Diagnosis Date   Diabetes mellitus    Diabetes mellitus without complication (HCC)    GERD (gastroesophageal reflux disease)    Hyperlipidemia    Hypertension    Pneumonia     Family History  Problem Relation Age of Onset   Breast cancer Mother    Heart disease Brother    Lung cancer Father    Healthy Paternal Grandmother    Cancer Brother    Colon cancer Neg Hx    Esophageal cancer Neg Hx    Stomach cancer Neg Hx    Rectal cancer Neg Hx    Colon polyps Neg Hx     Social History:   reports that he has been smoking cigarettes. He has been smoking an average of .25 packs per day. He has never used smokeless tobacco. He reports current alcohol use. He reports that he does not use drugs.  Medications  Current Facility-Administered Medications:    aspirin chewable tablet 324 mg, 324 mg, Oral, Once, Tegeler, Canary Brim, MD  Current Outpatient Medications:    ACCU-CHEK GUIDE test strip, TEST UP TO 4 TIMES A DAY, Disp: 300  strip, Rfl: 1   blood glucose meter kit and supplies KIT, Dispense based on patient and insurance preference. Use up to four times daily as directed. (FOR ICD-9 250.00, 250.01)., Disp: 1 each, Rfl: 0   Blood Glucose Monitoring Suppl (ACCU-CHEK AVIVA PLUS) w/Device KIT, Use to check blood sugars twice a day, Disp: 1 kit, Rfl: 0   docusate sodium (COLACE) 100 MG capsule, Take 1 capsule (100 mg total) by mouth daily as needed for up to 30 doses., Disp: 30 capsule, Rfl: 0   FARXIGA 10 MG TABS tablet, TAKE 1 TABLET BY MOUTH EVERY DAY, Disp: 90 tablet, Rfl: 1   finasteride (PROSCAR) 5 MG tablet, Take 5 mg by mouth in the morning. (Patient not taking: Reported on 08/05/2022), Disp: , Rfl:    glipiZIDE (GLUCOTROL XL) 5 MG 24 hr tablet, TAKE 1 TABLET BY MOUTH EVERY DAY WITH BREAKFAST, Disp: 90 tablet, Rfl: 1   Lancets (ACCU-CHEK MULTICLIX) lancets, Use to help check blood sugars twice a day, Disp: 102 each, Rfl: 5   rosuvastatin (CRESTOR) 20 MG tablet, TAKE 1 TABLET BY MOUTH EVERY DAY, Disp: 90 tablet, Rfl: 3   tamsulosin (FLOMAX) 0.4 MG CAPS capsule, Take 1 capsule (0.4 mg total) by mouth daily. (Patient not taking: Reported on 08/05/2022), Disp: 90 capsule, Rfl: 1   Tetrahydroz-Dextran-PEG-Povid (VISINE ADVANCED RELIEF) 0.05-0.1-1-1 %  SOLN, Place 1-2 drops into both eyes 3 (three) times daily as needed (itchy/red/irritated eyes.)., Disp: , Rfl:    valsartan-hydrochlorothiazide (DIOVAN-HCT) 160-12.5 MG tablet, Take 1 tablet by mouth daily., Disp: 90 tablet, Rfl: 3  Exam: Current vital signs: BP 118/79   Pulse 68   Temp 97.8 F (36.6 C) (Oral)   Resp 15   Ht 5\' 11"  (1.803 m)   Wt 99.8 kg   SpO2 93%   BMI 30.68 kg/m  Vital signs in last 24 hours: Temp:  [97.8 F (36.6 C)-98.2 F (36.8 C)] 97.8 F (36.6 C) (06/11 0816) Pulse Rate:  [68-104] 68 (06/11 0815) Resp:  [15-24] 15 (06/11 0815) BP: (118-167)/(77-96) 118/79 (06/11 0815) SpO2:  [93 %-97 %] 93 % (06/11 0815) Weight:  [99.8 kg] 99.8 kg  (06/11 0529)  GENERAL: Awake, alert in NAD HEENT: - Normocephalic and atraumatic, dry mm, no LN++, no Thyromegally LUNGS - Clear to auscultation bilaterally with no wheezes CV - S1S2 RRR, no m/r/g, equal pulses bilaterally. ABDOMEN - Soft, nontender, nondistended with normoactive BS Ext: warm, well perfused, intact peripheral pulses, no edema  NEURO:  Mental Status: AA&Ox3  Language: speech is clear.  Naming, repetition, fluency, and comprehension intact. Cranial Nerves: PERRL. Left gaze nystagmus, visual fields full, no facial asymmetry, facial sensation intact, hearing intact, tongue/uvula/soft palate midline, normal sternocleidomastoid and trapezius muscle strength. No evidence of tongue atrophy or fasciculations Motor: strength is equal in all extremities. No drift noted.  Tone: is normal and bulk is normal Sensation- diminished sensation right hand Coordination: FTN intact bilaterally, no ataxia in BLE. Gait- deferred  NIHSS 1a Level of Conscious.: 0 1b LOC Questions: 0 1c LOC Commands: 0 2 Best Gaze: 0 3 Visual: 0 4 Facial Palsy: 0 5a Motor Arm - left: 0 5b Motor Arm - Right: 0  6a Motor Leg - Left: 0 6b Motor Leg - Right: 0 7 Limb Ataxia: 0 8 Sensory: 1 9 Best Language: 0 10 Dysarthria: 0 11 Extinct. and Inatten.: 0 TOTAL: 1   Labs I have reviewed labs in epic and the results pertinent to this consultation are:  CBC    Component Value Date/Time   WBC 8.2 09/16/2022 0548   RBC 5.35 09/16/2022 0548   HGB 16.3 09/16/2022 0548   HCT 48.7 09/16/2022 0548   PLT 171 09/16/2022 0548   MCV 91.0 09/16/2022 0548   MCH 30.5 09/16/2022 0548   MCHC 33.5 09/16/2022 0548   RDW 13.4 09/16/2022 0548   LYMPHSABS 3.0 09/16/2022 0548   MONOABS 0.7 09/16/2022 0548   EOSABS 0.0 09/16/2022 0548   BASOSABS 0.0 09/16/2022 0548    CMP     Component Value Date/Time   NA 134 (L) 09/16/2022 0548   K 3.8 09/16/2022 0548   CL 99 09/16/2022 0548   CO2 21 (L) 09/16/2022 0548    GLUCOSE 234 (H) 09/16/2022 0548   BUN 22 09/16/2022 0548   CREATININE 1.31 (H) 09/16/2022 0548   CREATININE 1.06 01/10/2020 1121   CALCIUM 9.5 09/16/2022 0548   PROT 7.2 09/16/2022 0548   ALBUMIN 4.0 09/16/2022 0548   AST 23 09/16/2022 0548   ALT 22 09/16/2022 0548   ALT 39 04/11/2019 1615   ALKPHOS 52 09/16/2022 0548   BILITOT 0.7 09/16/2022 0548   GFRNONAA 60 (L) 09/16/2022 0548   GFRAA >60 02/21/2018 1547    Lipid Panel     Component Value Date/Time   CHOL 146 08/05/2022 1050   TRIG 155.0 (H) 08/05/2022 1050  HDL 41.30 08/05/2022 1050   CHOLHDL 4 08/05/2022 1050   VLDL 31.0 08/05/2022 1050   LDLCALC 74 08/05/2022 1050   LDLDIRECT 206.0 07/09/2021 1620     Imaging I have reviewed the images obtained:  CT-head Normal CT head  CT Angio Head and Neck Pending  MRI examination of the brain Punctate acute infarctions affecting the right posterolateral medulla and the inferior right cerebellum. No swelling or hemorrhage. Minimal small vessel change of the hemispheric white matter.  Assessment:  67 y.o. male presenting with right hand numbness and weakness, headache, vision changes, and balance issues. MRI shows a punctate infarct in the right posterolateral medulla and the inferior right cerebellum. He should be admitted to the hospitalist service for a stroke work up. Vessel imaging, labs, and echocardiogram have been ordered. Plan for asa 81mg  and plavix 75mg  for 3 weeks followed by asa 81mg  alone.   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 MD to update note as needed.   Elmer Picker, DNP, FNP-BC [facility] Pager: [phone]  ATTENDING ATTESTATION:  Stroke workup as above. DAPT therapy.  Dr. Viviann Spare evaluated pt independently, reviewed imaging, chart, labs. Discussed and formulated plan with the Resident/APP. Changes were made to the note where appropriate. Please see APP/resident note above for details.   Shaneka Efaw,MD

## 2022-09-16 NOTE — ED Triage Notes (Signed)
Pt reports he noticed his right hand go numb yesterday with increased weakness in his right arm. Pt states LSN between 1-2pm. Pt also reports headache and vision changes in both eyes, pt unable to describe vision changes. Pt also reports balance issues. Upon exam, no drift noted in right arm but decreased drip strength noted.

## 2022-09-16 NOTE — Plan of Care (Signed)
  Problem: Education: Goal: Knowledge of disease or condition will improve Outcome: Progressing   

## 2022-09-17 DIAGNOSIS — I639 Cerebral infarction, unspecified: Secondary | ICD-10-CM | POA: Diagnosis not present

## 2022-09-17 LAB — RENAL FUNCTION PANEL
Albumin: 3.7 g/dL (ref 3.5–5.0)
Anion gap: 14 (ref 5–15)
BUN: 25 mg/dL — ABNORMAL HIGH (ref 8–23)
CO2: 26 mmol/L (ref 22–32)
Calcium: 9.4 mg/dL (ref 8.9–10.3)
Chloride: 94 mmol/L — ABNORMAL LOW (ref 98–111)
Creatinine, Ser: 1.48 mg/dL — ABNORMAL HIGH (ref 0.61–1.24)
GFR, Estimated: 52 mL/min — ABNORMAL LOW (ref >60)
Glucose, Bld: 148 mg/dL — ABNORMAL HIGH (ref 70–99)
Phosphorus: 4.8 mg/dL — ABNORMAL HIGH (ref 2.5–4.6)
Potassium: 4.1 mmol/L (ref 3.5–5.1)
Sodium: 134 mmol/L — ABNORMAL LOW (ref 135–145)

## 2022-09-17 LAB — LIPID PANEL
Cholesterol: 152 mg/dL (ref 0–200)
HDL: 36 mg/dL — ABNORMAL LOW (ref >40)
LDL Cholesterol: 70 mg/dL (ref 0–99)
Total CHOL/HDL Ratio: 4.2 RATIO
Triglycerides: 228 mg/dL — ABNORMAL HIGH (ref <150)
VLDL: 46 mg/dL — ABNORMAL HIGH (ref 0–40)

## 2022-09-17 LAB — CBC
HCT: 45.9 % (ref 39.0–52.0)
Hemoglobin: 15.2 g/dL (ref 13.0–17.0)
MCH: 29.7 pg (ref 26.0–34.0)
MCHC: 33.1 g/dL (ref 30.0–36.0)
MCV: 89.6 fL (ref 80.0–100.0)
Platelets: 162 10*3/uL (ref 150–400)
RBC: 5.12 MIL/uL (ref 4.22–5.81)
RDW: 13.2 % (ref 11.5–15.5)
WBC: 9.2 10*3/uL (ref 4.0–10.5)
nRBC: 0 % (ref 0.0–0.2)

## 2022-09-17 LAB — GLUCOSE, CAPILLARY
Glucose-Capillary: 129 mg/dL — ABNORMAL HIGH (ref 70–99)
Glucose-Capillary: 253 mg/dL — ABNORMAL HIGH (ref 70–99)
Glucose-Capillary: 196 mg/dL — ABNORMAL HIGH (ref 70–99)
Glucose-Capillary: 151 mg/dL — ABNORMAL HIGH (ref 70–99)

## 2022-09-17 LAB — MAGNESIUM: Magnesium: 2.3 mg/dL (ref 1.7–2.4)

## 2022-09-17 MED ORDER — MELATONIN 3 MG PO TABS
3.0000 mg | ORAL_TABLET | Freq: Once | ORAL | Status: AC | PRN
  Administered 2022-09-17: 3 mg via ORAL
  Filled 2022-09-17: qty 1

## 2022-09-17 MED ORDER — ROSUVASTATIN CALCIUM 20 MG PO TABS
40.0000 mg | ORAL_TABLET | Freq: Every day | ORAL | Status: DC
  Administered 2022-09-18: 40 mg via ORAL
  Filled 2022-09-17: qty 2

## 2022-09-17 MED ORDER — EZETIMIBE 10 MG PO TABS
10.0000 mg | ORAL_TABLET | Freq: Every day | ORAL | Status: DC
  Administered 2022-09-17 – 2022-09-18 (×2): 10 mg via ORAL
  Filled 2022-09-17 (×2): qty 1

## 2022-09-17 NOTE — Progress Notes (Signed)
Inpatient Rehab Admissions Coordinator:   Per PT recommendation,  patient was screened for CIR candidacy by Megan Salon, MS, CCC-SLP. At this time, Pt. Appears to be a a potential candidate for CIR; however, Pt. Is very high level. Would like to see OT eval to assess needs with ADLs before placing a consult order.  Please contact me any with questions.  Megan Salon, MS, CCC-SLP Rehab Admissions Coordinator  [phone] (celll) [phone] (office)

## 2022-09-17 NOTE — Progress Notes (Signed)
Progress Note    Nathaniel Waters  OZH:[id number] DOB: 12-Aug-1955  DOA: 09/16/2022 PCP: Nathaniel Quint, MD      Brief Narrative:    Medical records reviewed and are as summarized below:  Nathaniel Waters is a 67 y.o. male  with medical history significant of tobacco abuse, reflux, diabetes mellitus 2 not on insulin, dyslipidemia, hypertension, chronic hep C previously treated with medications at our local ID clinic, and BPH no longer on medications.  He presented to the hospital with right-sided weakness and numbness, headache, vision changes and balance issues.  He took a nap around 1 PM the day prior to admission.  When he woke up from his nap he noticed the numbness and weakness in his right upper extremity.  Initially, he attributed this to sleeping on his hand. In the ED he was afebrile and mildly hypertensive with a blood pressure of 149/77, mildly tachypneic with a normal heart rate.  Room air sats were 95%.  Stat CT of the head without contrast showed no infarct or intracranial hemorrhage.  MRI brain without contrast revealed punctate acute infarctions affecting the right posterior lateral medulla and the inferior right cerebellum without swelling or hemorrhage.  Based on the MRI images there appears to be flow in the major posterior circulation vessels.     He was admitted to the hospital for acute stroke.  He was started on aspirin, Plavix and statins.  PT and OT recommended further rehabilitation at inpatient rehab center.     Assessment/Plan:   Principal Problem:   Acute CVA (cerebrovascular accident) (HCC)    Body mass index is 30.81 kg/m.  (Obesity)   Acute stroke (punctate infarct in the right posterolateral medulla inferior right cerebellum: Continue aspirin, Plavix and statin.  2D echo showed EF estimated at 60 to 65%, mild concentric LVH, grade 1 diastolic dysfunction, no shunt. PT recommended further rehabilitation at the inpatient rehab center.  Follow-up  with case manager to assist with disposition.   Hyperlipidemia: Continue ezetimibe and rosuvastatin. Total cholesterol 228, HDL 36, LDL 70   Type II DM: Continue glipizide and dapagliflozin.  NovoLog as needed for hyperglycemia.  Globin A1c was 6.1 on 08/05/2022.   Tobacco use disorder: Counseled to quit smoking.  Continue nicotine patch.   Other comorbidities include BPH, chronic hepatitis C (treated in the past)  Diet Order             Diet heart healthy/carb modified Fluid consistency: Thin  Diet effective now                            Consultants: Neurologist  Procedures: None    Medications:    aspirin EC  81 mg Oral Daily   clopidogrel  75 mg Oral Daily   dapagliflozin propanediol  10 mg Oral Daily   enoxaparin (LOVENOX) injection  40 mg Subcutaneous Q24H   ezetimibe  10 mg Oral Daily   glipiZIDE  5 mg Oral Q breakfast   insulin aspart  0-15 Units Subcutaneous TID WC   insulin aspart  0-5 Units Subcutaneous QHS   nicotine  14 mg Transdermal Daily   [START ON 09/18/2022] rosuvastatin  40 mg Oral Daily   sodium chloride flush  3 mL Intravenous Q12H   Continuous Infusions:   Anti-infectives (From admission, onward)    None              Family Communication/Anticipated D/C date and  plan/Code Status   DVT prophylaxis: enoxaparin (LOVENOX) injection 40 mg Start: 09/16/22 0945     Code Status: Full Code  Family Communication: None Disposition Plan: Plan to discharge to inpatient rehab   Status is: Observation The patient will require care spanning > 2 midnights and should be moved to inpatient because: Will need further rehabilitation at the inpatient rehab center.       Subjective:   Interval events noted.  He complains of right arm numbness and weakness.  Objective:    Vitals:   09/16/22 1740 09/16/22 2006 09/17/22 0012 09/17/22 0322  BP: 125/83 119/85 106/64 106/72  Pulse: 76 79 69 76  Resp: 20 18 18 17   Temp:  99.1 F (37.3 C) 98.2 F (36.8 C) 98.4 F (36.9 C) 98.2 F (36.8 C)  TempSrc: Oral     SpO2: 98% 100% 97% 97%  Weight:      Height:       No data found.   Intake/Output Summary (Last 24 hours) at 09/17/2022 1310 Last data filed at 09/16/2022 1747 Gross per 24 hour  Intake 237 ml  Output --  Net 237 ml   Filed Weights   09/16/22 0529 09/16/22 1122  Weight: 99.8 kg 100.2 kg    Exam:  GEN: NAD SKIN: Warm and dry EYES: No pallor or icterus ENT: MMM CV: RRR PULM: CTA B ABD: soft, obese, NT, +BS CNS: AAO x 3, right sided hemiparesis (power 4/5 in RUE and 4/5 in RLE) EXT: No edema or tenderness        Data Reviewed:   I have personally reviewed following labs and imaging studies:  Labs: Labs show the following:   Basic Metabolic Panel: Recent Labs  Lab 09/16/22 0543 09/16/22 0548 09/17/22 0236  NA 135 134* 134*  K 3.9 3.8 4.1  CL 99 99 94*  CO2  --  21* 26  GLUCOSE 239* 234* 148*  BUN 26* 22 25*  CREATININE 1.20 1.31* 1.48*  CALCIUM  --  9.5 9.4  MG  --   --  2.3  PHOS  --   --  4.8*   GFR Estimated Creatinine Clearance: 58.4 mL/min (A) (by C-G formula based on SCr of 1.48 mg/dL (H)). Liver Function Tests: Recent Labs  Lab 09/16/22 0548 09/17/22 0236  AST 23  --   ALT 22  --   ALKPHOS 52  --   BILITOT 0.7  --   PROT 7.2  --   ALBUMIN 4.0 3.7   No results for input(s): "LIPASE", "AMYLASE" in the last 168 hours. No results for input(s): "AMMONIA" in the last 168 hours. Coagulation profile Recent Labs  Lab 09/16/22 0548  INR 1.0    CBC: Recent Labs  Lab 09/16/22 0543 09/16/22 0548 09/17/22 0236  WBC  --  8.2 9.2  NEUTROABS  --  4.4  --   HGB 17.0 16.3 15.2  HCT 50.0 48.7 45.9  MCV  --  91.0 89.6  PLT  --  171 162   Cardiac Enzymes: No results for input(s): "CKTOTAL", "CKMB", "CKMBINDEX", "TROPONINI" in the last 168 hours. BNP (last 3 results) No results for input(s): "PROBNP" in the last 8760 hours. CBG: Recent Labs  Lab  09/16/22 1149 09/16/22 1742 09/16/22 2104 09/17/22 0633 09/17/22 1244  GLUCAP 129* 194* 146* 129* 253*   D-Dimer: No results for input(s): "DDIMER" in the last 72 hours. Hgb A1c: No results for input(s): "HGBA1C" in the last 72 hours. Lipid Profile: Recent Labs  09/17/22 0236  CHOL 152  HDL 36*  LDLCALC 70  TRIG 010*  CHOLHDL 4.2   Thyroid function studies: No results for input(s): "TSH", "T4TOTAL", "T3FREE", "THYROIDAB" in the last 72 hours.  Invalid input(s): "FREET3" Anemia work up: No results for input(s): "VITAMINB12", "FOLATE", "FERRITIN", "TIBC", "IRON", "RETICCTPCT" in the last 72 hours. Sepsis Labs: Recent Labs  Lab 09/16/22 0548 09/17/22 0236  WBC 8.2 9.2    Microbiology No results found for this or any previous visit (from the past 240 hour(s)).  Procedures and diagnostic studies:  CT ANGIO HEAD NECK W WO CM  Result Date: 09/16/2022 CLINICAL DATA:  Acute infarct in the right medulla and cerebellum on same-day MRI, right-sided weakness EXAM: CT ANGIOGRAPHY HEAD AND NECK WITH AND WITHOUT CONTRAST TECHNIQUE: Multidetector CT imaging of the head and neck was performed using the standard protocol during bolus administration of intravenous contrast. Multiplanar CT image reconstructions and MIPs were obtained to evaluate the vascular anatomy. Carotid stenosis measurements (when applicable) are obtained utilizing NASCET criteria, using the distal internal carotid diameter as the denominator. RADIATION DOSE REDUCTION: This exam was performed according to the departmental dose-optimization program which includes automated exposure control, adjustment of the mA and/or kV according to patient size and/or use of iterative reconstruction technique. CONTRAST:  75mL OMNIPAQUE IOHEXOL 350 MG/ML SOLN COMPARISON:  No prior CTA available, correlation is made with 09/16/2022 CT head and MRI head FINDINGS: CT HEAD FINDINGS For noncontrast findings, please see same day CT head. CTA  NECK FINDINGS Aortic arch: Standard branching. Imaged portion shows no evidence of aneurysm or dissection. No significant stenosis of the major arch vessel origins. Right carotid system: No evidence of stenosis, dissection, or occlusion. Left carotid system: No evidence of stenosis, dissection, or occlusion. Vertebral arteries: No evidence of stenosis, dissection, or occlusion. Skeleton: No acute osseous abnormality. Degenerative changes in the cervical spine. Other neck: No acute finding. Upper chest: No focal pulmonary opacity or pleural effusion. Review of the MIP images confirms the above findings CTA HEAD FINDINGS Anterior circulation: Both internal carotid arteries are patent to the termini, without significant stenosis. A1 segments patent. Normal anterior communicating artery. Anterior cerebral arteries are patent to their distal aspects without significant stenosis. No M1 stenosis or occlusion. MCA branches perfused to their distal aspects without significant stenosis. Posterior circulation: Vertebral arteries patent to the vertebrobasilar junction without significant stenosis. Posterior inferior cerebellar arteries patent proximally, although the right PICA is diminutive and not well visualized distal to its origin (series 5, image 150). Basilar patent to its distal aspect without significant stenosis. Superior cerebellar arteries patent proximally. Patent P1 segments. PCAs perfused to their distal aspects without significant stenosis. The right posterior communicating artery is patent. Venous sinuses: As permitted by contrast timing, patent. Anatomic variants: None significant. Review of the MIP images confirms the above findings IMPRESSION: 1. No intracranial large vessel occlusion or significant stenosis. The right PICA is patent proximally but is diminutive and not well visualized distal to its origin. 2. No hemodynamically significant stenosis in the neck. Electronically Signed   By: Wiliam Ke M.D.    On: 09/16/2022 18:19   ECHOCARDIOGRAM COMPLETE  Result Date: 09/16/2022    ECHOCARDIOGRAM REPORT   Patient Name:   Nathaniel Waters Date of Exam: 09/16/2022 Medical Rec #:  [id number]   Height:       71.0 in Accession #:    [id number]  Weight:       220.9 lb Date of Birth:  May 18, 1955  BSA:  2.200 m Patient Age:    66 years    BP:           132/72 mmHg Patient Gender: M           HR:           72 bpm. Exam Location:  Inpatient Procedure: 2D Echo, Cardiac Doppler and Color Doppler Indications:    Stroke I63.9  History:        Patient has no prior history of Echocardiogram examinations.                 Stroke; Risk Factors:Current Smoker, Dyslipidemia and Diabetes.  Sonographer:    Dondra Prader RVT RCS Referring Phys: [id number] DEVON SHAFER IMPRESSIONS  1. Left ventricular ejection fraction, by estimation, is 60 to 65%. The left ventricle has normal function. The left ventricle has no regional wall motion abnormalities. There is mild concentric left ventricular hypertrophy. Left ventricular diastolic parameters are consistent with Grade I diastolic dysfunction (impaired relaxation).  2. Right ventricular systolic function is normal. The right ventricular size is normal. Tricuspid regurgitation signal is inadequate for assessing PA pressure.  3. The mitral valve is grossly normal. Trivial mitral valve regurgitation. No evidence of mitral stenosis.  4. The aortic valve is tricuspid. Aortic valve regurgitation is not visualized. No aortic stenosis is present.  5. The inferior vena cava is normal in size with greater than 50% respiratory variability, suggesting right atrial pressure of 3 mmHg. Conclusion(s)/Recommendation(s): No intracardiac source of embolism detected on this transthoracic study. Consider a transesophageal echocardiogram to exclude cardiac source of embolism if clinically indicated. FINDINGS  Left Ventricle: Left ventricular ejection fraction, by estimation, is 60 to 65%. The left ventricle has  normal function. The left ventricle has no regional wall motion abnormalities. The left ventricular internal cavity size was normal in size. There is  mild concentric left ventricular hypertrophy. Left ventricular diastolic parameters are consistent with Grade I diastolic dysfunction (impaired relaxation). Right Ventricle: The right ventricular size is normal. No increase in right ventricular wall thickness. Right ventricular systolic function is normal. Tricuspid regurgitation signal is inadequate for assessing PA pressure. Left Atrium: Left atrial size was normal in size. Right Atrium: Right atrial size was normal in size. Pericardium: There is no evidence of pericardial effusion. Mitral Valve: The mitral valve is grossly normal. Trivial mitral valve regurgitation. No evidence of mitral valve stenosis. Tricuspid Valve: The tricuspid valve is grossly normal. Tricuspid valve regurgitation is not demonstrated. No evidence of tricuspid stenosis. Aortic Valve: The aortic valve is tricuspid. Aortic valve regurgitation is not visualized. No aortic stenosis is present. Aortic valve mean gradient measures 3.0 mmHg. Aortic valve peak gradient measures 4.8 mmHg. Aortic valve area, by VTI measures 2.53 cm. Pulmonic Valve: The pulmonic valve was grossly normal. Pulmonic valve regurgitation is not visualized. No evidence of pulmonic stenosis. Aorta: The aortic root and ascending aorta are structurally normal, with no evidence of dilitation. Venous: The inferior vena cava is normal in size with greater than 50% respiratory variability, suggesting right atrial pressure of 3 mmHg. IAS/Shunts: The atrial septum is grossly normal.  LEFT VENTRICLE PLAX 2D LVIDd:         4.30 cm   Diastology LVIDs:         2.80 cm   LV e' medial:    6.42 cm/s LV PW:         1.20 cm   LV E/e' medial:  7.6 LV IVS:  1.30 cm   LV e' lateral:   9.25 cm/s LVOT diam:     2.20 cm   LV E/e' lateral: 5.3 LV SV:         53 LV SV Index:   24 LVOT Area:      3.80 cm  RIGHT VENTRICLE             IVC RV Basal diam:  3.10 cm     IVC diam: 1.10 cm RV Mid diam:    2.10 cm RV S prime:     12.40 cm/s TAPSE (M-mode): 2.0 cm LEFT ATRIUM             Index        RIGHT ATRIUM          Index LA diam:        3.30 cm 1.50 cm/m   RA Area:     9.55 cm LA Vol (A2C):   50.3 ml 22.87 ml/m  RA Volume:   19.50 ml 8.87 ml/m LA Vol (A4C):   41.6 ml 18.91 ml/m LA Biplane Vol: 46.6 ml 21.19 ml/m  AORTIC VALVE                    PULMONIC VALVE AV Area (Vmax):    2.80 cm     PV Vmax:       0.87 m/s AV Area (Vmean):   2.69 cm     PV Peak grad:  3.0 mmHg AV Area (VTI):     2.53 cm AV Vmax:           109.00 cm/s AV Vmean:          74.700 cm/s AV VTI:            0.209 m AV Peak Grad:      4.8 mmHg AV Mean Grad:      3.0 mmHg LVOT Vmax:         80.20 cm/s LVOT Vmean:        52.900 cm/s LVOT VTI:          0.139 m LVOT/AV VTI ratio: 0.67  AORTA Ao Root diam: 3.10 cm Ao Asc diam:  2.70 cm MITRAL VALVE MV Area (PHT): 2.37 cm    SHUNTS MV Decel Time: 320 msec    Systemic VTI:  0.14 m MV E velocity: 48.70 cm/s  Systemic Diam: 2.20 cm MV A velocity: 70.70 cm/s MV E/A ratio:  0.69 Lennie Odor MD Electronically signed by Lennie Odor MD Signature Date/Time: 09/16/2022/4:28:44 PM    Final    MR Brain Wo Contrast (neuro protocol)  Result Date: 09/16/2022 CLINICAL DATA:  Neuro deficit, acute, stroke suspected. Right-sided weakness. Speech disturbance. Numbness of the right hand. EXAM: MRI HEAD WITHOUT CONTRAST TECHNIQUE: Multiplanar, multiecho pulse sequences of the brain and surrounding structures were obtained without intravenous contrast. COMPARISON:  Head CT earlier same day FINDINGS: Brain: Punctate acute infarction of the right posterolateral medulla. Punctate acute infarction in the inferior right cerebellum. No other acute finding. Otherwise, abnormality is seen affecting the brainstem or cerebellum. Cerebral hemispheres show minimal small vessel change of the deep white matter no  cortical or large vessel territory infarction. No mass, hemorrhage, hydrocephalus or extra-axial collection. Vascular: Major vessels at the base of the brain show flow. Skull and upper cervical spine: Negative Sinuses/Orbits: Clear/normal Other: None IMPRESSION: Punctate acute infarctions affecting the right posterolateral medulla and the inferior right cerebellum. No swelling or hemorrhage. Minimal small vessel change  of the hemispheric white matter. On the basis of the standard brain MR images, there does appear to be flow in major posterior circulation vessels. Electronically Signed   By: Paulina Fusi M.D.   On: 09/16/2022 08:18   CT HEAD WO CONTRAST  Result Date: 09/16/2022 CLINICAL DATA:  67 year old male with right side weakness and abnormal speech last known well yesterday morning. EXAM: CT HEAD WITHOUT CONTRAST TECHNIQUE: Contiguous axial images were obtained from the base of the skull through the vertex without intravenous contrast. RADIATION DOSE REDUCTION: This exam was performed according to the departmental dose-optimization program which includes automated exposure control, adjustment of the mA and/or kV according to patient size and/or use of iterative reconstruction technique. COMPARISON:  None Available. FINDINGS: Brain: No midline shift, mass effect, or evidence of intracranial mass lesion. No acute intracranial hemorrhage identified. No ventriculomegaly. Gray-white differentiation appears symmetric and within normal limits for age. No cortically based acute infarct identified. ASPECTS 10. No encephalomalacia identified. Vascular: Calcified atherosclerosis at the skull base. No suspicious intracranial vascular hyperdensity. Skull: Negative. Sinuses/Orbits: Visualized paranasal sinuses and mastoids are clear. Other: Visualized orbits and scalp soft tissues are within normal limits. IMPRESSION: Normal for age noncontrast CT appearance of the brain. No cortically based infarct or intracranial  hemorrhage identified. Electronically Signed   By: Odessa Fleming M.D.   On: 09/16/2022 06:46               LOS: 0 days   Deshonna Trnka  [facility]   Pager on [email or web address]. If 7PM-7AM, please contact night-coverage at [email or web address]     09/17/2022, 1:10 PM

## 2022-09-17 NOTE — Progress Notes (Signed)
Physical Therapy Treatment Patient Details Name: Nathaniel Waters MRN: [id number] DOB: Oct 15, 1955 Today's Date: 09/17/2022   History of Present Illness 67 yo male noted to have R UE weakness numbness vision changes and falling to the R. 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 Focused on gait without AD & with RW with pt demonstrating somewhat improved balance with use of AD. Pt negotiates flight of stairs with 1 rail with CGA with education re: compensatory pattern. Pt completed Berg Balance Test & scored 41/56. Patient demonstrates increased fall risk as noted by score of 41/56 on Berg Balance Scale.  (<36= high risk for falls, close to 100%; 37-45 significant >80%; 46-51 moderate >50%; 52-55 lower >25%). Recommend ongoing PT services to address high level deficits to reduce fall risk & facilitate return to independent, working PLOF.   Recommendations for follow up therapy are one component of a multi-disciplinary discharge planning process, led by the attending physician.  Recommendations may be updated based on patient status, additional functional criteria and insurance authorization.  Follow Up Recommendations       Assistance Recommended at Discharge Intermittent Supervision/Assistance  Patient can return home with the following A little help with walking and/or transfers;A little help with bathing/dressing/bathroom;Assist for transportation;Assistance with cooking/housework;Help with stairs or ramp for entrance   Equipment Recommendations  None recommended by PT (pt reports he already has RW)    Recommendations for Other Services       Precautions / Restrictions Precautions Precautions: Fall Restrictions Weight Bearing Restrictions: No     Mobility  Bed Mobility Overal bed mobility:  Modified Independent, Needs Assistance Bed Mobility: Supine to Sit     Supine to sit: Modified independent (Device/Increase time), HOB elevated     General bed mobility comments: use of bed rails PRN    Transfers Overall transfer level: Needs assistance Equipment used: None Transfers: Sit to/from Stand Sit to Stand: Supervision                Ambulation/Gait Ambulation/Gait assistance: Min assist, Supervision, Min guard Gait Distance (Feet): 100 Feet (+ 100 ft) Assistive device: None, Rolling walker (2 wheels) Gait Pattern/deviations: Decreased weight shift to left, Decreased stride length, Decreased step length - right, Staggering right Gait velocity: decreased     General Gait Details: Pt ambulates without AD with min assist<>CGA. Provided pt with RW & pt ambulates with CGA<>supervision. Pt with decreased awareness of need for feet inside RW especially when turning.   Stairs Stairs: Yes Stairs assistance: Min guard Stair Management: One rail Right Number of Stairs: 8 General stair comments: Pt negotiates flight of stairs in stairwell with CGA with R ascending rail/R descending rail. PT provides education re: compensatory pattern.   Wheelchair Mobility    Modified Rankin (Stroke Patients Only)       Balance Overall balance assessment: Needs assistance Sitting-balance support: No upper extremity supported, Feet supported Sitting balance-Leahy Scale: Good     Standing balance support: During functional activity, No upper extremity supported Standing balance-Leahy Scale: Fair                   Standardized Balance Assessment Standardized Balance Assessment : Berg Balance Test Berg Balance Test Sit to Stand: Able to stand without using hands and stabilize independently Standing Unsupported: Able  to stand safely 2 minutes Sitting with Back Unsupported but Feet Supported on Floor or Stool: Able to sit safely and securely 2 minutes Stand to Sit: Sits  safely with minimal use of hands Transfers: Able to transfer safely, minor use of hands Standing Unsupported with Eyes Closed: Able to stand 10 seconds safely Standing Ubsupported with Feet Together: Able to place feet together independently and stand for 1 minute with supervision From Standing, Reach Forward with Outstretched Arm: Can reach forward >12 cm safely (5") From Standing Position, Pick up Object from Floor: Able to pick up shoe safely and easily From Standing Position, Turn to Look Behind Over each Shoulder: Looks behind one side only/other side shows less weight shift Turn 360 Degrees: Able to turn 360 degrees safely but slowly Standing Unsupported, Alternately Place Feet on Step/Stool: Able to complete 4 steps without aid or supervision Standing Unsupported, One Foot in Front: Loses balance while stepping or standing Standing on One Leg: Unable to try or needs assist to prevent fall Total Score: 41        Cognition Arousal/Alertness: Awake/alert Behavior During Therapy: WFL for tasks assessed/performed, Anxious Overall Cognitive Status: Within Functional Limits for tasks assessed                                 General Comments: somewhat anxious re: situation        Exercises      General Comments        Pertinent Vitals/Pain Pain Assessment Pain Assessment: No/denies pain Faces Pain Scale: Hurts a little bit Pain Location: HA Pain Descriptors / Indicators: Discomfort Pain Intervention(s): Monitored during session    Home Living Family/patient expects to be discharged to:: Private residence Living Arrangements: Alone Available Help at Discharge: Family;Available PRN/intermittently Type of Home: Apartment Home Access: Stairs to enter Entrance Stairs-Rails: Left;Right;Can reach both Entrance Stairs-Number of Steps: flight   Home Layout: One level Home Equipment: Agricultural consultant (2 wheels);Crutches Additional Comments: no animals    Prior  Function            PT Goals (current goals can now be found in the care plan section) Acute Rehab PT Goals Patient Stated Goal: return to independent PLOF PT Goal Formulation: With patient Time For Goal Achievement: 10/01/22 Potential to Achieve Goals: Good Progress towards PT goals: Progressing toward goals    Frequency    Min 4X/week      PT Plan Current plan remains appropriate (if pt does not qualify for CIR, recommend OPPT f/u)    Co-evaluation              AM-PAC PT "6 Clicks" Mobility   Outcome Measure  Help needed turning from your back to your side while in a flat bed without using bedrails?: None Help needed moving from lying on your back to sitting on the side of a flat bed without using bedrails?: None Help needed moving to and from a bed to a chair (including a wheelchair)?: A Little Help needed standing up from a chair using your arms (e.g., wheelchair or bedside chair)?: A Little Help needed to walk in hospital room?: A Little Help needed climbing 3-5 steps with a railing? : A Little 6 Click Score: 20    End of Session Equipment Utilized During Treatment: Gait belt Activity Tolerance: Patient tolerated treatment well Patient left: with call bell/phone within reach;with bed alarm set;in bed;with family/visitor present Nurse Communication: Mobility  status PT Visit Diagnosis: Unsteadiness on feet (R26.81);Hemiplegia and hemiparesis;Other abnormalities of gait and mobility (R26.89) Hemiplegia - Right/Left: Right Hemiplegia - caused by: Cerebral infarction     Time: 1515-1530 PT Time Calculation (min) (ACUTE ONLY): 15 min  Charges:  $Gait Training: 8-22 mins                     Aleda Grana, PT, DPT 09/17/22, 3:38 PM   Sandi Mariscal 09/17/2022, 3:36 PM

## 2022-09-17 NOTE — Plan of Care (Signed)
  Problem: Education: Goal: Knowledge of disease or condition will improve Outcome: Progressing Goal: Knowledge of secondary prevention will improve (MUST DOCUMENT ALL) Outcome: Progressing Goal: Knowledge of patient specific risk factors will improve (Mark N/A or DELETE if not current risk factor) Outcome: Progressing   

## 2022-09-17 NOTE — Evaluation (Signed)
Speech Language Pathology Evaluation Patient Details Name: Nathaniel Waters MRN: [id number] DOB: 03/08/1956 Today's Date: 09/17/2022 Time: 2725-3664 SLP Time Calculation (min) (ACUTE ONLY): 24 min  Problem List:  Patient Active Problem List   Diagnosis Date Noted   Acute CVA (cerebrovascular accident) (HCC) 09/16/2022   Current smoker 11/18/2021   BPH (benign prostatic hyperplasia) 09/27/2021   BPH with obstruction/lower urinary tract symptoms 09/18/2021   Chronic hepatitis C without hepatic coma (HCC) 04/11/2019   Hypertension associated with diabetes (HCC) 04/09/2017   Cigarette nicotine dependence with nicotine-induced disorder 04/09/2017   Dyslipidemia associated with type 2 diabetes mellitus (HCC) 10/23/2014   Dyslipidemia 09/22/2014   Elevated PSA 09/22/2014   GERD 12/30/2007   Type II diabetes mellitus, uncontrolled 12/29/2007   TUBULOVILLOUS ADENOMA, COLON, HX OF 12/29/2007   Past Medical History:  Past Medical History:  Diagnosis Date   Diabetes mellitus    Diabetes mellitus without complication (HCC)    GERD (gastroesophageal reflux disease)    Hyperlipidemia    Hypertension    Pneumonia    Past Surgical History:  Past Surgical History:  Procedure Laterality Date   CARPAL TUNNEL RELEASE     both    COLONOSCOPY  2018   TRANSURETHRAL RESECTION OF PROSTATE N/A 09/27/2021   Procedure: TRANSURETHRAL RESECTION OF THE PROSTATE (TURP), BIPOLAR;  Surgeon: Jannifer Hick, MD;  Location: [facility];  Service: Urology;  Laterality: N/A;   HPI:  Pt is a 67 y.o. male who presented with numbness and weakness of the right arm. MRI brain 6/11: Punctate acute infarctions affecting the right posterolateral medulla and the inferior right cerebellum. PMH: tobacco abuse, reflux, diabetes mellitus 2 not on insulin, dyslipidemia, hypertension, chronic hep C previously treated with medications at our local ID clinic, and BPH no longer on medications.   Assessment / Plan /  Recommendation Clinical Impression  Pt participated in speech-language-cognition evaluation. Pt reported that he has an Museum/gallery conservator education, lives alone, and is employed as a Museum/gallery exhibitions officer. Pt reported that he typically has difficulty recalling information verbally despite his being able to use the information during procedural tasks. He denied any acute changes in speech, language, or cognition and stated that he compensates for this memory difficulty with use of external memory aids. [facility] Mental Status Examination was completed to evaluate the pt's cognitive-linguistic skills. He achieved a score of 22/30 which is below the normal limits of 27 or more out of 30 and is suggestive of a mild impairment. He exhibited difficulty in the areas of memory and executive function as it relates to complex problem solving and mental manipulation. However, pt indicated that he believes his performance is at baseline and SLP suspects that his impairments in memory likely negatively impacted performance during the problem solving and manipulation tasks. Further acute skilled SLP services are therefore not clinically indicated at this time, but pt has verbalized agreement with alerting staff or following up with his PCP if he subsequently believes that there has been an acute change in cognition.    SLP Assessment  SLP Recommendation/Assessment: Patient does not need any further Speech Lanaguage Pathology Services SLP Visit Diagnosis: Cognitive communication deficit (R41.841)    Recommendations for follow up therapy are one component of a multi-disciplinary discharge planning process, led by the attending physician.  Recommendations may be updated based on patient status, additional functional criteria and insurance authorization.    Follow Up Recommendations  No SLP follow up (Unless pt subsequently notices an acute change)  Assistance Recommended at Discharge     Functional Status  Assessment    Frequency and Duration           SLP Evaluation Cognition  Overall Cognitive Status: History of cognitive impairments - at baseline Arousal/Alertness: Awake/alert Orientation Level: Oriented X4 Year: 2024 Month: June Day of Week: Correct Attention: Focused;Sustained Focused Attention: Appears intact Sustained Attention: Appears intact Memory: Impaired Memory Impairment:  (Immediate: 5/5; delayed: 2/5; with cues: 3/3) Awareness: Appears intact Problem Solving: Impaired Problem Solving Impairment: Verbal complex (money:1/3; time: 2/2) Executive Function: Sequencing;Organizing Sequencing: Appears intact (clock: 4/4) Organizing: Impaired Organizing Impairment: Verbal complex (backward digit span: 1/2)       Comprehension  Auditory Comprehension Overall Auditory Comprehension: Appears within functional limits for tasks assessed Yes/No Questions: Within Functional Limits Commands: Within Functional Limits Conversation: Complex    Expression Expression Primary Mode of Expression: Verbal Verbal Expression Overall Verbal Expression: Appears within functional limits for tasks assessed Initiation: No impairment Level of Generative/Spontaneous Verbalization: Conversation Repetition: No impairment Naming: No impairment Pragmatics: No impairment Written Expression Dominant Hand: Right   Oral / Motor  Oral Motor/Sensory Function Overall Oral Motor/Sensory Function: Within functional limits Motor Speech Overall Motor Speech: Impaired at baseline Respiration: Within functional limits Phonation: Normal Resonance: Within functional limits Articulation: Impaired Level of Impairment: Conversation Intelligibility: Intelligibility reduced (baseline lisp) Word: 75-100% accurate Phrase: 75-100% accurate Sentence: 75-100% accurate Conversation: 75-100% accurate           Shawntina Diffee I. Vear Clock, MS, CCC-SLP Neuro Diagnostic Specialist  Acute Rehabilitation  Services Office number: [phone]  Scheryl Marten 09/17/2022, 12:37 PM

## 2022-09-17 NOTE — Progress Notes (Signed)
   Inpatient Rehab Admissions Coordinator :  Per  PT therapy recommendations patient was screened for CIR candidacy by Ottie Glazier RN MSN. Noted OT is recommending Outpatient therapy.  Patient does not appear to demonstrate the functional need for a Hospital Rehabilitation /CIR admit. High functional level. I will not place a Rehab Consult. Recommend other Rehab Venues to be pursued. Please contact me with any questions.  Ottie Glazier RN MSN Admissions Coordinator [phone]

## 2022-09-17 NOTE — TOC Initial Note (Signed)
Transition of Care [facility]) - Initial/Assessment Note    Patient Details  Name: Nathaniel Waters MRN: [id number] Date of Birth: Jan 16, 1956  Transition of Care [employer]) CM/SW Contact:    Kermit Balo, RN Phone Number: 09/17/2022, 12:43 PM  Clinical Narrative:                 Pt is from home alone. He states his brother could check in on him. Has DME at home but doesn't currently use any.  Pt manages his own medications and denies any issues.  [facility] [address] Pt drives self as needed.  Current recommendations are for CIR. Awaiting work up. TOC following.  Expected Discharge Plan: IP Rehab Facility Barriers to Discharge: Continued Medical Work up   Patient Goals and CMS Choice   CMS Medicare.gov Compare Post Acute Care list provided to:: Patient Choice offered to / list presented to : Patient      Expected Discharge Plan and Services   Discharge Planning Services: CM Consult (walker/ crutches/ shower seat) Post Acute Care Choice: IP Rehab Living arrangements for the past 2 months: Apartment                                      Prior Living Arrangements/Services Living arrangements for the past 2 months: Apartment Lives with:: Self Patient language and need for interpreter reviewed:: Yes Do you feel safe going back to the place where you live?: Yes        Care giver support system in place?: No (comment)   Criminal Activity/Legal Involvement Pertinent to Current Situation/Hospitalization: No - Comment as needed  Activities of Daily Living Home Assistive Devices/Equipment: None ADL Screening (condition at time of admission) Patient's cognitive ability adequate to safely complete daily activities?: Yes Is the patient deaf or have difficulty hearing?: No Does the patient have difficulty seeing, even when wearing glasses/contacts?: No Does the patient have difficulty concentrating, remembering, or making decisions?: No Patient able to express need for  assistance with ADLs?: Yes Does the patient have difficulty dressing or bathing?: No Independently performs ADLs?: Yes (appropriate for developmental age) Does the patient have difficulty walking or climbing stairs?: No Weakness of Legs: None Weakness of Arms/Hands: Right  Permission Sought/Granted                  Emotional Assessment Appearance:: Appears stated age Attitude/Demeanor/Rapport: Engaged Affect (typically observed): Accepting Orientation: : Oriented to Self, Oriented to Place, Oriented to  Time, Oriented to Situation   Psych Involvement: No (comment)  Admission diagnosis:  Acute CVA (cerebrovascular accident) [facility]) [I63.9] Cerebrovascular accident (CVA), unspecified mechanism (HCC) [I63.9] Patient Active Problem List   Diagnosis Date Noted   Acute CVA (cerebrovascular accident) (HCC) 09/16/2022   Current smoker 11/18/2021   BPH (benign prostatic hyperplasia) 09/27/2021   BPH with obstruction/lower urinary tract symptoms 09/18/2021   Chronic hepatitis C without hepatic coma (HCC) 04/11/2019   Hypertension associated with diabetes (HCC) 04/09/2017   Cigarette nicotine dependence with nicotine-induced disorder 04/09/2017   Dyslipidemia associated with type 2 diabetes mellitus (HCC) 10/23/2014   Dyslipidemia 09/22/2014   Elevated PSA 09/22/2014   GERD 12/30/2007   Type II diabetes mellitus, uncontrolled 12/29/2007   TUBULOVILLOUS ADENOMA, COLON, HX OF 12/29/2007   PCP:  Georgina Quint, MD Pharmacy:   [phone] [place] [place] - [address]  [place] [address] Phone: [phone] Fax: [phone]  [facility] [address] [place] [address] Phone: [phone] Fax: [phone]  [id number] - [address] [place]), [place] - [address] [place] ([place] [address] Phone: [phone] Fax:  [phone]     Social Determinants of Health (SDOH) Social History: SDOH Screenings   Food Insecurity: No Food Insecurity (09/16/2022)  Housing: Low Risk  (09/16/2022)  Transportation Needs: No Transportation Needs (09/16/2022)  Utilities: Not At Risk (09/16/2022)  Alcohol Screen: Low Risk  (06/10/2022)  Depression (PHQ2-9): Low Risk  (08/05/2022)  Financial Resource Strain: Low Risk  (06/10/2022)  Physical Activity: Sufficiently Active (06/10/2022)  Social Connections: Socially Integrated (06/10/2022)  Stress: No Stress Concern Present (06/10/2022)  Tobacco Use: High Risk (09/16/2022)   SDOH Interventions:     Readmission Risk Interventions     No data to display

## 2022-09-17 NOTE — Progress Notes (Addendum)
STROKE TEAM PROGRESS NOTE   INTERVAL HISTORY No family at the bedside.  Sitting up in chair.  Continued right-sided weakness, needed assistance when ambulating in hall earlier, Pt recommends AIR for discharge planning.   Stroke work-up complete, recommend Aspirin and Plavix for 3 months, then aspirin alone.  Plan discussed with patient at bedside.   Vitals:   09/16/22 1740 09/16/22 2006 09/17/22 0012 09/17/22 0322  BP: 125/83 119/85 106/64 106/72  Pulse: 76 79 69 76  Resp: 20 18 18 17   Temp: 99.1 F (37.3 C) 98.2 F (36.8 C) 98.4 F (36.9 C) 98.2 F (36.8 C)  TempSrc: Oral     SpO2: 98% 100% 97% 97%  Weight:      Height:       CBC:  Recent Labs  Lab 09/16/22 0548 09/17/22 0236  WBC 8.2 9.2  NEUTROABS 4.4  --   HGB 16.3 15.2  HCT 48.7 45.9  MCV 91.0 89.6  PLT 171 162   Basic Metabolic Panel:  Recent Labs  Lab 09/16/22 0548 09/17/22 0236  NA 134* 134*  K 3.8 4.1  CL 99 94*  CO2 21* 26  GLUCOSE 234* 148*  BUN 22 25*  CREATININE 1.31* 1.48*  CALCIUM 9.5 9.4  MG  --  2.3  PHOS  --  4.8*   Lipid Panel:  Recent Labs  Lab 09/17/22 0236  CHOL 152  TRIG 228*  HDL 36*  CHOLHDL 4.2  VLDL 46*  LDLCALC 70   HgbA1c: 6.1 (08/05/22)  Urine Drug Screen:  Recent Labs  Lab 09/16/22 0940  LABOPIA NONE DETECTED  COCAINSCRNUR NONE DETECTED  LABBENZ NONE DETECTED  AMPHETMU NONE DETECTED  THCU NONE DETECTED  LABBARB NONE DETECTED    Alcohol Level  Recent Labs  Lab 09/16/22 0548  ETH <10    IMAGING past 24 hours CT ANGIO HEAD NECK W WO CM  Result Date: 09/16/2022 CLINICAL DATA:  Acute infarct in the right medulla and cerebellum on same-day MRI, right-sided weakness EXAM: CT ANGIOGRAPHY HEAD AND NECK WITH AND WITHOUT CONTRAST TECHNIQUE: Multidetector CT imaging of the head and neck was performed using the standard protocol during bolus administration of intravenous contrast. Multiplanar CT image reconstructions and MIPs were obtained to evaluate the vascular  anatomy. Carotid stenosis measurements (when applicable) are obtained utilizing NASCET criteria, using the distal internal carotid diameter as the denominator. RADIATION DOSE REDUCTION: This exam was performed according to the departmental dose-optimization program which includes automated exposure control, adjustment of the mA and/or kV according to patient size and/or use of iterative reconstruction technique. CONTRAST:  75mL OMNIPAQUE IOHEXOL 350 MG/ML SOLN COMPARISON:  No prior CTA available, correlation is made with 09/16/2022 CT head and MRI head FINDINGS: CT HEAD FINDINGS For noncontrast findings, please see same day CT head. CTA NECK FINDINGS Aortic arch: Standard branching. Imaged portion shows no evidence of aneurysm or dissection. No significant stenosis of the major arch vessel origins. Right carotid system: No evidence of stenosis, dissection, or occlusion. Left carotid system: No evidence of stenosis, dissection, or occlusion. Vertebral arteries: No evidence of stenosis, dissection, or occlusion. Skeleton: No acute osseous abnormality. Degenerative changes in the cervical spine. Other neck: No acute finding. Upper chest: No focal pulmonary opacity or pleural effusion. Review of the MIP images confirms the above findings CTA HEAD FINDINGS Anterior circulation: Both internal carotid arteries are patent to the termini, without significant stenosis. A1 segments patent. Normal anterior communicating artery. Anterior cerebral arteries are patent to their distal aspects  without significant stenosis. No M1 stenosis or occlusion. MCA branches perfused to their distal aspects without significant stenosis. Posterior circulation: Vertebral arteries patent to the vertebrobasilar junction without significant stenosis. Posterior inferior cerebellar arteries patent proximally, although the right PICA is diminutive and not well visualized distal to its origin (series 5, image 150). Basilar patent to its distal aspect  without significant stenosis. Superior cerebellar arteries patent proximally. Patent P1 segments. PCAs perfused to their distal aspects without significant stenosis. The right posterior communicating artery is patent. Venous sinuses: As permitted by contrast timing, patent. Anatomic variants: None significant. Review of the MIP images confirms the above findings IMPRESSION: 1. No intracranial large vessel occlusion or significant stenosis. The right PICA is patent proximally but is diminutive and not well visualized distal to its origin. 2. No hemodynamically significant stenosis in the neck. Electronically Signed   By: Wiliam Ke M.D.   On: 09/16/2022 18:19   ECHOCARDIOGRAM COMPLETE  Result Date: 09/16/2022    ECHOCARDIOGRAM REPORT   Patient Name:   KERWIN POEHLMAN Date of Exam: 09/16/2022 Medical Rec #:  [id number]   Height:       71.0 in Accession #:    [id number]  Weight:       220.9 lb Date of Birth:  [birth year]  BSA:          2.200 m Patient Age:    66 years    BP:           132/72 mmHg Patient Gender: M           HR:           72 bpm. Exam Location:  Inpatient Procedure: 2D Echo, Cardiac Doppler and Color Doppler Indications:    Stroke I63.9  History:        Patient has no prior history of Echocardiogram examinations.                 Stroke; Risk Factors:Current Smoker, Dyslipidemia and Diabetes.  Sonographer:    Dondra Prader RVT RCS Referring Phys: [id number] DEVON SHAFER IMPRESSIONS  1. Left ventricular ejection fraction, by estimation, is 60 to 65%. The left ventricle has normal function. The left ventricle has no regional wall motion abnormalities. There is mild concentric left ventricular hypertrophy. Left ventricular diastolic parameters are consistent with Grade I diastolic dysfunction (impaired relaxation).  2. Right ventricular systolic function is normal. The right ventricular size is normal. Tricuspid regurgitation signal is inadequate for assessing PA pressure.  3. The mitral valve is grossly  normal. Trivial mitral valve regurgitation. No evidence of mitral stenosis.  4. The aortic valve is tricuspid. Aortic valve regurgitation is not visualized. No aortic stenosis is present.  5. The inferior vena cava is normal in size with greater than 50% respiratory variability, suggesting right atrial pressure of 3 mmHg. Conclusion(s)/Recommendation(s): No intracardiac source of embolism detected on this transthoracic study. Consider a transesophageal echocardiogram to exclude cardiac source of embolism if clinically indicated. FINDINGS  Left Ventricle: Left ventricular ejection fraction, by estimation, is 60 to 65%. The left ventricle has normal function. The left ventricle has no regional wall motion abnormalities. The left ventricular internal cavity size was normal in size. There is  mild concentric left ventricular hypertrophy. Left ventricular diastolic parameters are consistent with Grade I diastolic dysfunction (impaired relaxation). Right Ventricle: The right ventricular size is normal. No increase in right ventricular wall thickness. Right ventricular systolic function is normal. Tricuspid regurgitation signal is inadequate for assessing PA pressure. Left Atrium:  Left atrial size was normal in size. Right Atrium: Right atrial size was normal in size. Pericardium: There is no evidence of pericardial effusion. Mitral Valve: The mitral valve is grossly normal. Trivial mitral valve regurgitation. No evidence of mitral valve stenosis. Tricuspid Valve: The tricuspid valve is grossly normal. Tricuspid valve regurgitation is not demonstrated. No evidence of tricuspid stenosis. Aortic Valve: The aortic valve is tricuspid. Aortic valve regurgitation is not visualized. No aortic stenosis is present. Aortic valve mean gradient measures 3.0 mmHg. Aortic valve peak gradient measures 4.8 mmHg. Aortic valve area, by VTI measures 2.53 cm. Pulmonic Valve: The pulmonic valve was grossly normal. Pulmonic valve regurgitation  is not visualized. No evidence of pulmonic stenosis. Aorta: The aortic root and ascending aorta are structurally normal, with no evidence of dilitation. Venous: The inferior vena cava is normal in size with greater than 50% respiratory variability, suggesting right atrial pressure of 3 mmHg. IAS/Shunts: The atrial septum is grossly normal.  LEFT VENTRICLE PLAX 2D LVIDd:         4.30 cm   Diastology LVIDs:         2.80 cm   LV e' medial:    6.42 cm/s LV PW:         1.20 cm   LV E/e' medial:  7.6 LV IVS:        1.30 cm   LV e' lateral:   9.25 cm/s LVOT diam:     2.20 cm   LV E/e' lateral: 5.3 LV SV:         53 LV SV Index:   24 LVOT Area:     3.80 cm  RIGHT VENTRICLE             IVC RV Basal diam:  3.10 cm     IVC diam: 1.10 cm RV Mid diam:    2.10 cm RV S prime:     12.40 cm/s TAPSE (M-mode): 2.0 cm LEFT ATRIUM             Index        RIGHT ATRIUM          Index LA diam:        3.30 cm 1.50 cm/m   RA Area:     9.55 cm LA Vol (A2C):   50.3 ml 22.87 ml/m  RA Volume:   19.50 ml 8.87 ml/m LA Vol (A4C):   41.6 ml 18.91 ml/m LA Biplane Vol: 46.6 ml 21.19 ml/m  AORTIC VALVE                    PULMONIC VALVE AV Area (Vmax):    2.80 cm     PV Vmax:       0.87 m/s AV Area (Vmean):   2.69 cm     PV Peak grad:  3.0 mmHg AV Area (VTI):     2.53 cm AV Vmax:           109.00 cm/s AV Vmean:          74.700 cm/s AV VTI:            0.209 m AV Peak Grad:      4.8 mmHg AV Mean Grad:      3.0 mmHg LVOT Vmax:         80.20 cm/s LVOT Vmean:        52.900 cm/s LVOT VTI:          0.139 m LVOT/AV VTI ratio: 0.67  AORTA Ao Root diam: 3.10 cm Ao Asc diam:  2.70 cm MITRAL VALVE MV Area (PHT): 2.37 cm    SHUNTS MV Decel Time: 320 msec    Systemic VTI:  0.14 m MV E velocity: 48.70 cm/s  Systemic Diam: 2.20 cm MV A velocity: 70.70 cm/s MV E/A ratio:  0.69 Lennie Odor MD Electronically signed by Lennie Odor MD Signature Date/Time: 09/16/2022/4:28:44 PM    Final     PHYSICAL EXAM  Temp:  [98.2 F (36.8 C)-99.1 F (37.3 C)] 98.2  F (36.8 C) (06/12 0322) Pulse Rate:  [69-79] 76 (06/12 0322) Resp:  [17-20] 17 (06/12 0322) BP: (106-125)/(64-85) 106/72 (06/12 0322) SpO2:  [97 %-100 %] 97 % (06/12 0322)  General - Well nourished, well developed, in no apparent distress.  Mental Status -  Level of arousal and orientation to time, place, and person were intact. Dysarthria present. Naming, repetition, comprehension intact. Attention span and concentration were normal. Recent and remote memory were intact. Cranial Nerves II - XII - II - Visual field intact OU. III, IV, VI - Extraocular movements intact. V - Facial sensation intact bilaterally. VII - Facial movement intact bilaterally. VIII - Hearing & vestibular intact bilaterally. X - Palate elevates symmetrically. XI - Chin turning & shoulder shrug intact bilaterally. XII - Tongue protrusion intact.  Motor Strength -  RUE: 4-/5 bicep/tricep, 3/5 deltoid, 4/5 grip. No drift RLE: 4/5 throughout, no drift.  LLE/LUE: 5/5, no drift.  Motor Tone - Muscle tone was assessed at the neck and appendages and was normal.  Sensory - Diminished to right hand  Coordination - Slight ataxia to RUE.Tremor was absent.  Gait and Station - deferred.   ASSESSMENT/PLAN Nathaniel Waters is a 67 y.o. male with a past medical history of HTN, diabetes, HLD, GERD presenting with right hand numbness and weakness, headache, vision changes, and balance issues.Around 1300, he felt like his right arm/hand were asleep and he assumed he had just slept funny.  He does still have the "sleep" sensation in his right hand, a mild headache, and some balance difficulties. He does endorse smoking cigarettes, occasional alcohol use, he is compliant with his prescribed medication. He does not currently take a baby aspirin.   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  CTA head & neck Right PICA is patent proximally but is diminutive and not well  visualized distal to its origin. MRI  Punctate acute infarctions affecting the right posterolateral medulla and the inferior right cerebellum.  2D Echo: EF 60 to 65%, mild concentric LVH, grade 1 diastolic dysfunction, no shunt LDL 70 HgbA1c 6.1 VTE prophylaxis - lovenox No antithrombotic prior to admission, now on aspirin 81 mg daily and clopidogrel 75 mg daily for 3 months, then Aspirin alone given large vessel occlusion.  Therapy recommendations:  CIR Disposition:  pending  Hypertension Home meds:  none Avoid low BP Long-term BP goal normotensive  Hyperlipidemia Home meds:  Crestor 20mg  and Zetia LDL 70, goal < 70 Increase to Crestor 40mg  Continue statin and Zetia at discharge  Diabetes type II controlled Home meds:  Glipizide and Farxiga HgbA1c 6.1, goal < 7.0 CBGs SSI Close PCP follow-up as outpatient  Tobacco abuse Current smoker Smoking cessation counseling provided Nicotine patch provided Pt is willing to quit  Other Stroke Risk Factors Advanced Age >/= 33  ETOH use, alcohol level <10, advised to drink no more than 1-2 drink(s) a day Obesity, Body mass index is 30.81 kg/m., BMI >/= 30 associated with increased stroke risk, recommend weight loss, diet and exercise as appropriate   Other Active Problems GERD BPH  Hospital day # 0  Pt seen by Neuro NP/APP and later by MD. Note/plan to be edited by MD as needed.    Lynnae January, DNP, AGACNP-BC [facility] Please use AMION for contact information & EPIC for messaging.  ATTENDING NOTE: I reviewed above note and agree with the assessment and plan. Pt was seen and examined.   Son and OT at bedside.  Patient lying bed, awake alert completed x 3.  Denies vertigo but complaining of lightheadedness.  Still has right arm numbness but no facial or leg numbness.  No nystagmus, no dysphagia.  Right  upper extremity ataxic, however right lower extremity no ataxia.  Moving all extremities.  MRI showed right lateral medullary infarct as well as punctate right cerebellar infarct.  Consistent with right PICA occlusion which showed on CTA.  Continue DAPT for 3 months and then aspirin alone given large vessel disease.  Increase Crestor from 20 to 40, continue Zetia.  PT and OT recommend CIR.  For detailed assessment and plan, please refer to above/below as I have made changes wherever appropriate.   Neurology will sign off. Please call with questions. Pt will follow up with stroke clinic NP at [facility] in about 4 weeks. Thanks for the consult.   Marvel Plan, MD PhD Stroke Neurology 09/17/2022 5:29 PM    To contact Stroke Continuity provider, please refer to [email or web address]. After hours, contact General Neurology

## 2022-09-17 NOTE — Evaluation (Signed)
Occupational Therapy Evaluation Patient Details Name: Nathaniel Waters MRN: [id number] DOB: 1955/11/23 Today's Date: 09/17/2022   History of Present Illness 67 yo male noted to have R UE weakness numbness vision changes and falling to the R. Imaging reveals punctate acute infarction affecting the right posterior lateral medulla and inferior right cerebellum  PMH DM2 HTN HLD hepC BPH and tobacco use   Clinical Impression   PT admitted with CVA with visual and balance changes. Pt currently with functional limitiations due to the deficits listed below (see OT problem list). Pt at baseline indep and drives. Pt currently notes blurred vision with glasses on. Pt noted to have decreased fine motor and sensation to R hand. Brother ( retired) present in the room and agreeable to transport patient to outpatient appointments. Pt advised and education on risk factors in stroke booklet and skin with cooking due to decreased sensation.  Pt will benefit from skilled OT to increase their independence and safety with adls and balance to allow discharge outpatient.       Recommendations for follow up therapy are one component of a multi-disciplinary discharge planning process, led by the attending physician.  Recommendations may be updated based on patient status, additional functional criteria and insurance authorization.   Assistance Recommended at Discharge PRN  Patient can return home with the following Assist for transportation    Functional Status Assessment  Patient has had a recent decline in their functional status and demonstrates the ability to make significant improvements in function in a reasonable and predictable amount of time.  Equipment Recommendations  None recommended by OT    Recommendations for Other Services       Precautions / Restrictions Precautions Precautions: Fall Restrictions Weight Bearing Restrictions: No      Mobility Bed Mobility Overal bed mobility: Modified  Independent                  Transfers Overall transfer level: Needs assistance   Transfers: Sit to/from Stand Sit to Stand: Supervision                  Balance Overall balance assessment: Mild deficits observed, not formally tested Sitting-balance support: No upper extremity supported, Feet supported Sitting balance-Leahy Scale: Good     Standing balance support: Single extremity supported, During functional activity, Reliant on assistive device for balance Standing balance-Leahy Scale: Fair               High level balance activites: Backward walking, Turns, Sudden stops, Head turns High Level Balance Comments: pt noted to have decreased gait velocity, pt with drift with L head turns. pt unable to hold head in cervical rotation with continued gait           ADL either performed or assessed with clinical judgement   ADL Overall ADL's : Needs assistance/impaired Eating/Feeding: Modified independent   Grooming: Wash/dry face   Upper Body Bathing: Modified independent   Lower Body Bathing: Supervison/ safety       Lower Body Dressing: Supervision/safety           Tub/ Shower Transfer: Therapist, sports Details (indicate cue type and reason): completed tub transfer with R UE holding the wall. pt reports it feels "funny" due to sensation changes in arm and leg Functional mobility during ADLs: Supervision/safety       Vision Baseline Vision/History: 1 Wears glasses Ability to See in Adequate Light: 1 Impaired Patient Visual Report: Blurring of vision Vision Assessment?: Yes Eye Alignment: Within  Functional Limits Ocular Range of Motion: Within Functional Limits Alignment/Gaze Preference: Within Defined Limits Tracking/Visual Pursuits: Able to track stimulus in all quads without difficulty Saccades: Within functional limits Additional Comments: normal pupil     Perception     Praxis      Pertinent Vitals/Pain Pain  Assessment Pain Assessment: No/denies pain Pain Location: HA Pain Descriptors / Indicators: Discomfort Pain Intervention(s): Monitored during session     Hand Dominance Right   Extremity/Trunk Assessment Upper Extremity Assessment Upper Extremity Assessment: RUE deficits/detail RUE Deficits / Details: numbness in R arm RUE Sensation: decreased light touch RUE Coordination: decreased fine motor;decreased gross motor   Lower Extremity Assessment Lower Extremity Assessment: Defer to PT evaluation   Cervical / Trunk Assessment Cervical / Trunk Assessment: Normal   Communication Communication Communication: No difficulties   Cognition Arousal/Alertness: Awake/alert Behavior During Therapy: WFL for tasks assessed/performed, Anxious Overall Cognitive Status: Within Functional Limits for tasks assessed                                       General Comments       Exercises     Shoulder Instructions      Home Living Family/patient expects to be discharged to:: Private residence Living Arrangements: Alone Available Help at Discharge: Family;Available PRN/intermittently Type of Home: Apartment Home Access: Stairs to enter Entrance Stairs-Number of Steps: flight Entrance Stairs-Rails: Left;Right;Can reach both Home Layout: One level     Bathroom Shower/Tub: Chief Strategy Officer: Standard     Home Equipment: Agricultural consultant (2 wheels);Crutches   Additional Comments: no animals      Prior Functioning/Environment Prior Level of Function : Driving;Independent/Modified Independent             Mobility Comments: Pt reports he's independent without AD, denies falls, enjoys going to the gym. Was recently working but currently not, was hoping to start back Monday. ADLs Comments: medication management indep with use of pill bottle no other system        OT Problem List: Decreased activity tolerance;Impaired balance (sitting and/or  standing);Impaired vision/perception;Decreased coordination;Impaired UE functional use      OT Treatment/Interventions: Self-care/ADL training;Therapeutic exercise;Neuromuscular education;Energy conservation;DME and/or AE instruction;Manual therapy;Modalities;Therapeutic activities;Cognitive remediation/compensation;Visual/perceptual remediation/compensation;Patient/family education;Balance training    OT Goals(Current goals can be found in the care plan section) Acute Rehab OT Goals Patient Stated Goal: to be abel to return home and to the gym OT Goal Formulation: With patient Time For Goal Achievement: 10/01/22 Potential to Achieve Goals: Good  OT Frequency: Min 2X/week    Co-evaluation              AM-PAC OT "6 Clicks" Daily Activity     Outcome Measure Help from another person eating meals?: A Little Help from another person taking care of personal grooming?: A Little Help from another person toileting, which includes using toliet, bedpan, or urinal?: A Little Help from another person bathing (including washing, rinsing, drying)?: A Little Help from another person to put on and taking off regular upper body clothing?: A Little Help from another person to put on and taking off regular lower body clothing?: A Little 6 Click Score: 18   End of Session Equipment Utilized During Treatment: Gait belt Nurse Communication: Mobility status;Precautions  Activity Tolerance: Patient tolerated treatment well Patient left: Other (comment) (passed to PT)  OT Visit Diagnosis: Unsteadiness on feet (R26.81);Muscle weakness (generalized) (M62.81);Low  vision, both eyes (H54.2)                Time: 1610-9604 OT Time Calculation (min): 19 min Charges:  OT General Charges $OT Visit: 1 Visit OT Evaluation $OT Eval Moderate Complexity: 1 Mod   Brynn, OTR/L  Acute Rehabilitation Services Office: [phone] .   Mateo Flow 09/17/2022, 4:09 PM

## 2022-09-17 NOTE — Evaluation (Signed)
Physical Therapy Evaluation Patient Details Name: Nathaniel Waters MRN: [id number] DOB: 1955/06/18 Today's Date: 09/17/2022  History of Present Illness  Pt is a 67 y/o M admitted on 09/16/22 after presenting with c/o RUE weakness, numbness, & some vision changes, as well as falling to the R side. 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 PMH: DM2, HTN, HLD, Hep C, BPH, tobacco use disorder  Clinical Impression  Pt seen for PT evaluation with pt agreeable to tx. Pt reports prior to admission he was independent without AD, driving, denies falls, recently working as a Museum/gallery exhibitions officer & hoping to return Monday. On this date, pt presents with RUE gross coordination deficits & impaired balance. Pt with R lateral lean in standing. Pt requires min assist for gait without AD, min assist for stair negotiation with B rails (pt able to negotiate stairs without rails prior to admission). Pt asking appropriate questions re: stroke & recovery with PT answering them as able. Pt is very motivated to return to independent PLOF. Recommend ongoing acute PT services but pt also would benefit from post acute rehab >3 hours day to address deficits & facilitate return to independence. At end of session pt notes he has experienced dizziness upon standing, but improves with sitting; HR 90bpm.       Recommendations for follow up therapy are one component of a multi-disciplinary discharge planning process, led by the attending physician.  Recommendations may be updated based on patient status, additional functional criteria and insurance authorization.  Follow Up Recommendations       Assistance Recommended at Discharge Intermittent Supervision/Assistance  Patient can return home with the following  A little help with walking and/or transfers;A little help with bathing/dressing/bathroom;Assist for  transportation;Assistance with cooking/housework;Help with stairs or ramp for entrance    Equipment Recommendations None recommended by PT  Recommendations for Other Services       Functional Status Assessment Patient has had a recent decline in their functional status and demonstrates the ability to make significant improvements in function in a reasonable and predictable amount of time.     Precautions / Restrictions Precautions Precautions: Fall Restrictions Weight Bearing Restrictions: No      Mobility  Bed Mobility Overal bed mobility: Modified Independent, Needs Assistance Bed Mobility: Supine to Sit     Supine to sit: Modified independent (Device/Increase time), HOB elevated     General bed mobility comments: use of bed rails PRN    Transfers Overall transfer level: Needs assistance Equipment used: None Transfers: Sit to/from Stand Sit to Stand: Supervision                Ambulation/Gait Ambulation/Gait assistance: Min assist Gait Distance (Feet): 100 Feet (+ 100 ft) Assistive device: None Gait Pattern/deviations: Decreased weight shift to left, Decreased stride length, Decreased step length - right, Staggering right Gait velocity: decreased     General Gait Details: R lateral lean, R staggering/mild LOB with min assist to correct  Stairs Stairs: Yes Stairs assistance: Min assist Stair Management: Two rails Number of Stairs: 5 (6" + 3") General stair comments: Pt initially attempted to negotiate stairs without rails noting this is what he did prior to admission but pt with LOB requiring min assist & UE support to correct. Pt is able to negotiate steps with B rails & min assist, education re: compensatory pattern.  Wheelchair Mobility    Modified Rankin (Stroke Patients  Only)       Balance Overall balance assessment: Needs assistance Sitting-balance support: No upper extremity supported, Feet supported Sitting balance-Leahy Scale: Good      Standing balance support: During functional activity, No upper extremity supported Standing balance-Leahy Scale: Fair                               Pertinent Vitals/Pain Pain Assessment Pain Assessment: Faces Faces Pain Scale: Hurts a little bit Pain Location: HA Pain Descriptors / Indicators: Discomfort Pain Intervention(s): Monitored during session    Home Living Family/patient expects to be discharged to:: Private residence Living Arrangements: Alone Available Help at Discharge: Family;Available PRN/intermittently Type of Home: Apartment Home Access: Stairs to enter Entrance Stairs-Rails: Left;Right;Can reach both Entrance Stairs-Number of Steps: flight   Home Layout: One level        Prior Function Prior Level of Function : Driving;Independent/Modified Independent             Mobility Comments: Pt reports he's independent without AD, denies falls, enjoys going to the gym. Was recently working but currently not, was hoping to start back Monday.       Hand Dominance   Dominant Hand: Right    Extremity/Trunk Assessment   Upper Extremity Assessment Upper Extremity Assessment: RUE deficits/detail;Defer to OT evaluation (BUE opposition fairly equal, BUE sensation (to light touch) & proprioception intact.) RUE Deficits / Details: Decreased speed & accuracy of movement with RUE rapid alternating movements. Impaired gross movement/coordination.    Lower Extremity Assessment Lower Extremity Assessment:  (BLE sensation (to light touch) & proprioception intact)    Cervical / Trunk Assessment Cervical / Trunk Assessment: Normal  Communication   Communication: No difficulties (pt denies changes in speech)  Cognition Arousal/Alertness: Awake/alert Behavior During Therapy: WFL for tasks assessed/performed Overall Cognitive Status: Within Functional Limits for tasks assessed                                 General Comments: Pleasant man,  motivated to return to independence, asking appropriate questions re: recovery.        General Comments General comments (skin integrity, edema, etc.): Pt toilets (continent void) during session. PT educated pt on what a stroke is, stroke recovery & purpose of therapy.    Exercises     Assessment/Plan    PT Assessment Patient needs continued PT services  PT Problem List Decreased coordination;Decreased activity tolerance;Decreased balance;Decreased mobility       PT Treatment Interventions DME instruction;Therapeutic exercise;Gait training;Balance training;Stair training;Neuromuscular re-education;Functional mobility training;Therapeutic activities;Patient/family education    PT Goals (Current goals can be found in the Care Plan section)  Acute Rehab PT Goals Patient Stated Goal: return to independent PLOF PT Goal Formulation: With patient Time For Goal Achievement: 10/01/22 Potential to Achieve Goals: Good    Frequency Min 4X/week     Co-evaluation               AM-PAC PT "6 Clicks" Mobility  Outcome Measure Help needed turning from your back to your side while in a flat bed without using bedrails?: None Help needed moving from lying on your back to sitting on the side of a flat bed without using bedrails?: A Little Help needed moving to and from a bed to a chair (including a wheelchair)?: A Little Help needed standing up from a chair using your arms (e.g., wheelchair  or bedside chair)?: A Little Help needed to walk in hospital room?: A Little Help needed climbing 3-5 steps with a railing? : A Little 6 Click Score: 19    End of Session Equipment Utilized During Treatment: Gait belt Activity Tolerance: Patient tolerated treatment well Patient left: in chair;with chair alarm set;with call bell/phone within reach   PT Visit Diagnosis: Unsteadiness on feet (R26.81);Hemiplegia and hemiparesis;Other abnormalities of gait and mobility (R26.89) Hemiplegia - Right/Left:  Right Hemiplegia - caused by: Cerebral infarction    Time: 0825-0850 PT Time Calculation (min) (ACUTE ONLY): 25 min   Charges:   PT Evaluation $PT Eval Low Complexity: 1 Low          Aleda Grana, PT, DPT 09/17/22, 9:07 AM   Sandi Mariscal 09/17/2022, 9:04 AM

## 2022-09-17 NOTE — Care Management Obs Status (Signed)
MEDICARE OBSERVATION STATUS NOTIFICATION   Patient Details  Name: Nathaniel Waters MRN: [id number] Date of Birth: 1955-11-08   Medicare Observation Status Notification Given:  Yes    Kermit Balo, RN 09/17/2022, 2:03 PM

## 2022-09-18 DIAGNOSIS — I639 Cerebral infarction, unspecified: Secondary | ICD-10-CM | POA: Diagnosis not present

## 2022-09-18 LAB — GLUCOSE, CAPILLARY
Glucose-Capillary: 145 mg/dL — ABNORMAL HIGH (ref 70–99)
Glucose-Capillary: 147 mg/dL — ABNORMAL HIGH (ref 70–99)

## 2022-09-18 MED ORDER — CLOPIDOGREL BISULFATE 75 MG PO TABS: 75.0000 mg | ORAL_TABLET | Freq: Every day | ORAL | 0 refills | Status: DC

## 2022-09-18 MED ORDER — EZETIMIBE 10 MG PO TABS: 10.0000 mg | ORAL_TABLET | Freq: Every day | ORAL | 0 refills | Status: DC

## 2022-09-18 MED ORDER — ASPIRIN 81 MG PO TBEC: 81.0000 mg | DELAYED_RELEASE_TABLET | Freq: Every day | ORAL | Status: AC

## 2022-09-18 MED ORDER — ROSUVASTATIN CALCIUM 40 MG PO TABS
40.0000 mg | ORAL_TABLET | Freq: Every day | ORAL | 0 refills | Status: DC
Start: 2022-09-18 — End: 2022-10-18

## 2022-09-18 MED ORDER — NICOTINE 14 MG/24HR TD PT24: 14.0000 mg | MEDICATED_PATCH | Freq: Every day | TRANSDERMAL | 0 refills | Status: DC

## 2022-09-18 NOTE — Discharge Summary (Signed)
Physician Discharge Summary   Patient: Nathaniel Waters MRN: [id number] DOB: Apr 19, 1955  Admit date:     09/16/2022  Discharge date: 09/18/22  Discharge Physician: Lurene Shadow   PCP: Georgina Quint, MD   Recommendations at discharge:   Follow-up with PCP in 1 to 2 weeks Follow-up with neurologist within 1 month of discharge  Discharge Diagnoses: Principal Problem:   Acute CVA (cerebrovascular accident) [facility]) Active Problems:   Hypertension associated with diabetes (HCC)   Current smoker  Resolved Problems:   * No resolved hospital problems. [facility] Course:  Nathaniel Waters is a 67 y.o. male  with medical history significant of tobacco abuse, reflux, diabetes mellitus 2 not on insulin, dyslipidemia, hypertension, chronic hep C previously treated with medications at our local ID clinic, and BPH no longer on medications.  He presented to the hospital with right-sided weakness and numbness, headache, vision changes and balance issues.  He took a nap around 1 PM the day prior to admission.  When he woke up from his nap he noticed the numbness and weakness in his right upper extremity.  Initially, he attributed this to sleeping on his hand. In the ED he was afebrile and mildly hypertensive with a blood pressure of 149/77, mildly tachypneic with a normal heart rate.  Room air sats were 95%.  Stat CT of the head without contrast showed no infarct or intracranial hemorrhage.  MRI brain without contrast revealed punctate acute infarctions affecting the right posterior lateral medulla and the inferior right cerebellum without swelling or hemorrhage.  Based on the MRI images there appears to be flow in the major posterior circulation vessels.        He was admitted to the hospital for acute stroke.  He was started on aspirin, Plavix and statins.     Assessment and Plan:   Acute stroke (punctate infarct in the right posterolateral medulla inferior right cerebellum: Continue aspirin,  Plavix and statin.  2D echo showed EF estimated at 60 to 65%, mild concentric LVH, grade 1 diastolic dysfunction, no shunt. Patient did well with PT and OT and outpatient PT and OT were recommended.  He said someone will be able to drive him to his therapy appointments.     Hyperlipidemia: Continue ezetimibe and rosuvastatin. Total cholesterol 228, HDL 36, LDL 70     Type II DM: Continue glipizide and dapagliflozin.  Hemoglobin A1c was 6.1 on 08/05/2022.     Tobacco use disorder: Counseled to quit smoking.  Continue nicotine patch.     Other comorbidities include BPH, chronic hepatitis C (treated in the past)   His condition has improved and he is deemed stable for discharge to home today.      Consultants: Neurologist Procedures performed: None Disposition: Home Diet recommendation:  Discharge Diet Orders (From admission, onward)     Start     Ordered   09/18/22 0000  Diet - low sodium heart healthy        09/18/22 1202   09/18/22 0000  Diet Carb Modified        09/18/22 1202           Cardiac and Carb modified diet DISCHARGE MEDICATION: Allergies as of 09/18/2022       Reactions   Metformin And Related Diarrhea   Jardiance [empagliflozin] Rash        Medication List     STOP taking these medications    acetaminophen 500 MG tablet Commonly known as: TYLENOL  tamsulosin 0.4 MG Caps capsule Commonly known as: FLOMAX   valsartan 80 MG tablet Commonly known as: DIOVAN       TAKE these medications    accu-chek multiclix lancets Use to help check blood sugars twice a day   aspirin EC 81 MG tablet Take 1 tablet (81 mg total) by mouth daily. Swallow whole. Start taking on: September 19, 2022   clopidogrel 75 MG tablet Commonly known as: PLAVIX Take 1 tablet (75 mg total) by mouth daily. Start taking on: September 19, 2022   ezetimibe 10 MG tablet Commonly known as: ZETIA Take 1 tablet (10 mg total) by mouth daily. Start taking on: September 19, 2022    Farxiga 10 MG Tabs tablet Generic drug: dapagliflozin propanediol TAKE 1 TABLET BY MOUTH EVERY DAY   finasteride 5 MG tablet Commonly known as: PROSCAR Take 5 mg by mouth in the morning.   glipiZIDE 5 MG 24 hr tablet Commonly known as: GLUCOTROL XL TAKE 1 TABLET BY MOUTH EVERY DAY WITH BREAKFAST What changed: See the new instructions.   nicotine 14 mg/24hr patch Commonly known as: NICODERM CQ - dosed in mg/24 hours Place 1 patch (14 mg total) onto the skin daily. Start taking on: September 19, 2022   rosuvastatin 40 MG tablet Commonly known as: CRESTOR Take 1 tablet (40 mg total) by mouth daily. What changed:  medication strength how much to take   valsartan-hydrochlorothiazide 160-12.5 MG tablet Commonly known as: DIOVAN-HCT Take 1 tablet by mouth daily.        Follow-up Information     [facility]. Schedule an appointment as soon as possible for a visit in 1 month(s).   Specialty: Neurology Why: stroke clinic Contact information: [address] [place] [address] [phone]        [facility]. Schedule an appointment as soon as possible for a visit.   Specialty: Rehabilitation Contact information: [address] [id number] mc [place] [address] [phone]               Discharge Exam: Ceasar Mons Weights   09/16/22 0529 09/16/22 1122  Weight: 99.8 kg 100.2 kg   GEN: NAD SKIN: No rash EYES: EOMI, PERRLA ENT: MMM CV: RRR PULM: CTA B ABD: soft, obese, NT, +BS CNS: AAO x 3, non focal EXT: No edema or tenderness   Condition at discharge: good  The results of significant diagnostics from this hospitalization (including imaging, microbiology, ancillary and laboratory) are listed below for reference.   Imaging Studies: CT ANGIO HEAD NECK W WO CM  Result Date: 09/16/2022 CLINICAL DATA:  Acute infarct in the right medulla and cerebellum on  same-day MRI, right-sided weakness EXAM: CT ANGIOGRAPHY HEAD AND NECK WITH AND WITHOUT CONTRAST TECHNIQUE: Multidetector CT imaging of the head and neck was performed using the standard protocol during bolus administration of intravenous contrast. Multiplanar CT image reconstructions and MIPs were obtained to evaluate the vascular anatomy. Carotid stenosis measurements (when applicable) are obtained utilizing NASCET criteria, using the distal internal carotid diameter as the denominator. RADIATION DOSE REDUCTION: This exam was performed according to the departmental dose-optimization program which includes automated exposure control, adjustment of the mA and/or kV according to patient size and/or use of iterative reconstruction technique. CONTRAST:  75mL OMNIPAQUE IOHEXOL 350 MG/ML SOLN COMPARISON:  No prior CTA available, correlation is made with 09/16/2022 CT head and MRI head FINDINGS: CT HEAD FINDINGS For noncontrast findings, please see same day CT  head. CTA NECK FINDINGS Aortic arch: Standard branching. Imaged portion shows no evidence of aneurysm or dissection. No significant stenosis of the major arch vessel origins. Right carotid system: No evidence of stenosis, dissection, or occlusion. Left carotid system: No evidence of stenosis, dissection, or occlusion. Vertebral arteries: No evidence of stenosis, dissection, or occlusion. Skeleton: No acute osseous abnormality. Degenerative changes in the cervical spine. Other neck: No acute finding. Upper chest: No focal pulmonary opacity or pleural effusion. Review of the MIP images confirms the above findings CTA HEAD FINDINGS Anterior circulation: Both internal carotid arteries are patent to the termini, without significant stenosis. A1 segments patent. Normal anterior communicating artery. Anterior cerebral arteries are patent to their distal aspects without significant stenosis. No M1 stenosis or occlusion. MCA branches perfused to their distal aspects without  significant stenosis. Posterior circulation: Vertebral arteries patent to the vertebrobasilar junction without significant stenosis. Posterior inferior cerebellar arteries patent proximally, although the right PICA is diminutive and not well visualized distal to its origin (series 5, image 150). Basilar patent to its distal aspect without significant stenosis. Superior cerebellar arteries patent proximally. Patent P1 segments. PCAs perfused to their distal aspects without significant stenosis. The right posterior communicating artery is patent. Venous sinuses: As permitted by contrast timing, patent. Anatomic variants: None significant. Review of the MIP images confirms the above findings IMPRESSION: 1. No intracranial large vessel occlusion or significant stenosis. The right PICA is patent proximally but is diminutive and not well visualized distal to its origin. 2. No hemodynamically significant stenosis in the neck. Electronically Signed   By: Wiliam Ke M.D.   On: 09/16/2022 18:19   ECHOCARDIOGRAM COMPLETE  Result Date: 09/16/2022    ECHOCARDIOGRAM REPORT   Patient Name:   Nathaniel Waters Date of Exam: 09/16/2022 Medical Rec #:  [id number]   Height:       71.0 in Accession #:    [id number]  Weight:       220.9 lb Date of Birth:  1955-04-09  BSA:          2.200 m Patient Age:    66 years    BP:           132/72 mmHg Patient Gender: M           HR:           72 bpm. Exam Location:  Inpatient Procedure: 2D Echo, Cardiac Doppler and Color Doppler Indications:    Stroke I63.9  History:        Patient has no prior history of Echocardiogram examinations.                 Stroke; Risk Factors:Current Smoker, Dyslipidemia and Diabetes.  Sonographer:    Dondra Prader RVT RCS Referring Phys: [id number] DEVON SHAFER IMPRESSIONS  1. Left ventricular ejection fraction, by estimation, is 60 to 65%. The left ventricle has normal function. The left ventricle has no regional wall motion abnormalities. There is mild concentric left  ventricular hypertrophy. Left ventricular diastolic parameters are consistent with Grade I diastolic dysfunction (impaired relaxation).  2. Right ventricular systolic function is normal. The right ventricular size is normal. Tricuspid regurgitation signal is inadequate for assessing PA pressure.  3. The mitral valve is grossly normal. Trivial mitral valve regurgitation. No evidence of mitral stenosis.  4. The aortic valve is tricuspid. Aortic valve regurgitation is not visualized. No aortic stenosis is present.  5. The inferior vena cava is normal in size with greater than 50% respiratory variability, suggesting  right atrial pressure of 3 mmHg. Conclusion(s)/Recommendation(s): No intracardiac source of embolism detected on this transthoracic study. Consider a transesophageal echocardiogram to exclude cardiac source of embolism if clinically indicated. FINDINGS  Left Ventricle: Left ventricular ejection fraction, by estimation, is 60 to 65%. The left ventricle has normal function. The left ventricle has no regional wall motion abnormalities. The left ventricular internal cavity size was normal in size. There is  mild concentric left ventricular hypertrophy. Left ventricular diastolic parameters are consistent with Grade I diastolic dysfunction (impaired relaxation). Right Ventricle: The right ventricular size is normal. No increase in right ventricular wall thickness. Right ventricular systolic function is normal. Tricuspid regurgitation signal is inadequate for assessing PA pressure. Left Atrium: Left atrial size was normal in size. Right Atrium: Right atrial size was normal in size. Pericardium: There is no evidence of pericardial effusion. Mitral Valve: The mitral valve is grossly normal. Trivial mitral valve regurgitation. No evidence of mitral valve stenosis. Tricuspid Valve: The tricuspid valve is grossly normal. Tricuspid valve regurgitation is not demonstrated. No evidence of tricuspid stenosis. Aortic Valve:  The aortic valve is tricuspid. Aortic valve regurgitation is not visualized. No aortic stenosis is present. Aortic valve mean gradient measures 3.0 mmHg. Aortic valve peak gradient measures 4.8 mmHg. Aortic valve area, by VTI measures 2.53 cm. Pulmonic Valve: The pulmonic valve was grossly normal. Pulmonic valve regurgitation is not visualized. No evidence of pulmonic stenosis. Aorta: The aortic root and ascending aorta are structurally normal, with no evidence of dilitation. Venous: The inferior vena cava is normal in size with greater than 50% respiratory variability, suggesting right atrial pressure of 3 mmHg. IAS/Shunts: The atrial septum is grossly normal.  LEFT VENTRICLE PLAX 2D LVIDd:         4.30 cm   Diastology LVIDs:         2.80 cm   LV e' medial:    6.42 cm/s LV PW:         1.20 cm   LV E/e' medial:  7.6 LV IVS:        1.30 cm   LV e' lateral:   9.25 cm/s LVOT diam:     2.20 cm   LV E/e' lateral: 5.3 LV SV:         53 LV SV Index:   24 LVOT Area:     3.80 cm  RIGHT VENTRICLE             IVC RV Basal diam:  3.10 cm     IVC diam: 1.10 cm RV Mid diam:    2.10 cm RV S prime:     12.40 cm/s TAPSE (M-mode): 2.0 cm LEFT ATRIUM             Index        RIGHT ATRIUM          Index LA diam:        3.30 cm 1.50 cm/m   RA Area:     9.55 cm LA Vol (A2C):   50.3 ml 22.87 ml/m  RA Volume:   19.50 ml 8.87 ml/m LA Vol (A4C):   41.6 ml 18.91 ml/m LA Biplane Vol: 46.6 ml 21.19 ml/m  AORTIC VALVE                    PULMONIC VALVE AV Area (Vmax):    2.80 cm     PV Vmax:       0.87 m/s AV Area (Vmean):   2.69 cm  PV Peak grad:  3.0 mmHg AV Area (VTI):     2.53 cm AV Vmax:           109.00 cm/s AV Vmean:          74.700 cm/s AV VTI:            0.209 m AV Peak Grad:      4.8 mmHg AV Mean Grad:      3.0 mmHg LVOT Vmax:         80.20 cm/s LVOT Vmean:        52.900 cm/s LVOT VTI:          0.139 m LVOT/AV VTI ratio: 0.67  AORTA Ao Root diam: 3.10 cm Ao Asc diam:  2.70 cm MITRAL VALVE MV Area (PHT): 2.37 cm    SHUNTS  MV Decel Time: 320 msec    Systemic VTI:  0.14 m MV E velocity: 48.70 cm/s  Systemic Diam: 2.20 cm MV A velocity: 70.70 cm/s MV E/A ratio:  0.69 Lennie Odor MD Electronically signed by Lennie Odor MD Signature Date/Time: 09/16/2022/4:28:44 PM    Final    MR Brain Wo Contrast (neuro protocol)  Result Date: 09/16/2022 CLINICAL DATA:  Neuro deficit, acute, stroke suspected. Right-sided weakness. Speech disturbance. Numbness of the right hand. EXAM: MRI HEAD WITHOUT CONTRAST TECHNIQUE: Multiplanar, multiecho pulse sequences of the brain and surrounding structures were obtained without intravenous contrast. COMPARISON:  Head CT earlier same day FINDINGS: Brain: Punctate acute infarction of the right posterolateral medulla. Punctate acute infarction in the inferior right cerebellum. No other acute finding. Otherwise, abnormality is seen affecting the brainstem or cerebellum. Cerebral hemispheres show minimal small vessel change of the deep white matter no cortical or large vessel territory infarction. No mass, hemorrhage, hydrocephalus or extra-axial collection. Vascular: Major vessels at the base of the brain show flow. Skull and upper cervical spine: Negative Sinuses/Orbits: Clear/normal Other: None IMPRESSION: Punctate acute infarctions affecting the right posterolateral medulla and the inferior right cerebellum. No swelling or hemorrhage. Minimal small vessel change of the hemispheric white matter. On the basis of the standard brain MR images, there does appear to be flow in major posterior circulation vessels. Electronically Signed   By: Paulina Fusi M.D.   On: 09/16/2022 08:18   CT HEAD WO CONTRAST  Result Date: 09/16/2022 CLINICAL DATA:  67 year old male with right side weakness and abnormal speech last known well yesterday morning. EXAM: CT HEAD WITHOUT CONTRAST TECHNIQUE: Contiguous axial images were obtained from the base of the skull through the vertex without intravenous contrast. RADIATION DOSE  REDUCTION: This exam was performed according to the departmental dose-optimization program which includes automated exposure control, adjustment of the mA and/or kV according to patient size and/or use of iterative reconstruction technique. COMPARISON:  None Available. FINDINGS: Brain: No midline shift, mass effect, or evidence of intracranial mass lesion. No acute intracranial hemorrhage identified. No ventriculomegaly. Gray-white differentiation appears symmetric and within normal limits for age. No cortically based acute infarct identified. ASPECTS 10. No encephalomalacia identified. Vascular: Calcified atherosclerosis at the skull base. No suspicious intracranial vascular hyperdensity. Skull: Negative. Sinuses/Orbits: Visualized paranasal sinuses and mastoids are clear. Other: Visualized orbits and scalp soft tissues are within normal limits. IMPRESSION: Normal for age noncontrast CT appearance of the brain. No cortically based infarct or intracranial hemorrhage identified. Electronically Signed   By: Odessa Fleming M.D.   On: 09/16/2022 06:46    Microbiology: No results found for this or any previous visit.  Labs: CBC:  Recent Labs  Lab 09/16/22 0543 09/16/22 0548 09/17/22 0236  WBC  --  8.2 9.2  NEUTROABS  --  4.4  --   HGB 17.0 16.3 15.2  HCT 50.0 48.7 45.9  MCV  --  91.0 89.6  PLT  --  171 162   Basic Metabolic Panel: Recent Labs  Lab 09/16/22 0543 09/16/22 0548 09/17/22 0236  NA 135 134* 134*  K 3.9 3.8 4.1  CL 99 99 94*  CO2  --  21* 26  GLUCOSE 239* 234* 148*  BUN 26* 22 25*  CREATININE 1.20 1.31* 1.48*  CALCIUM  --  9.5 9.4  MG  --   --  2.3  PHOS  --   --  4.8*   Liver Function Tests: Recent Labs  Lab 09/16/22 0548 09/17/22 0236  AST 23  --   ALT 22  --   ALKPHOS 52  --   BILITOT 0.7  --   PROT 7.2  --   ALBUMIN 4.0 3.7   CBG: Recent Labs  Lab 09/17/22 1244 09/17/22 1741 09/17/22 2122 09/18/22 0611 09/18/22 1203  GLUCAP 253* 196* 151* 145* 147*     Discharge time spent: greater than 30 minutes.  Signed: Lurene Shadow, MD [facility] 09/18/2022

## 2022-09-18 NOTE — Plan of Care (Signed)
Problem: Education: Goal: Knowledge of disease or condition will improve Outcome: Adequate for Discharge Goal: Knowledge of secondary prevention will improve (MUST DOCUMENT ALL) Outcome: Adequate for Discharge Goal: Knowledge of patient specific risk factors will improve (Mark N/A or DELETE if not current risk factor) Outcome: Adequate for Discharge   Problem: Ischemic Stroke/TIA Tissue Perfusion: Goal: Complications of ischemic stroke/TIA will be minimized Outcome: Adequate for Discharge   Problem: Coping: Goal: Will verbalize positive feelings about self Outcome: Adequate for Discharge Goal: Will identify appropriate support needs Outcome: Adequate for Discharge   Problem: Health Behavior/Discharge Planning: Goal: Ability to manage health-related needs will improve Outcome: Adequate for Discharge Goal: Goals will be collaboratively established with patient/family Outcome: Adequate for Discharge   Problem: Self-Care: Goal: Ability to participate in self-care as condition permits will improve Outcome: Adequate for Discharge Goal: Verbalization of feelings and concerns over difficulty with self-care will improve Outcome: Adequate for Discharge Goal: Ability to communicate needs accurately will improve Outcome: Adequate for Discharge   Problem: Nutrition: Goal: Risk of aspiration will decrease Outcome: Adequate for Discharge Goal: Dietary intake will improve Outcome: Adequate for Discharge   Problem: Education: Goal: Ability to describe self-care measures that may prevent or decrease complications (Diabetes Survival Skills Education) will improve Outcome: Adequate for Discharge Goal: Individualized Educational Video(s) Outcome: Adequate for Discharge   Problem: Coping: Goal: Ability to adjust to condition or change in health will improve Outcome: Adequate for Discharge   Problem: Fluid Volume: Goal: Ability to maintain a balanced intake and output will  improve Outcome: Adequate for Discharge   Problem: Health Behavior/Discharge Planning: Goal: Ability to identify and utilize available resources and services will improve Outcome: Adequate for Discharge Goal: Ability to manage health-related needs will improve Outcome: Adequate for Discharge   Problem: Metabolic: Goal: Ability to maintain appropriate glucose levels will improve Outcome: Adequate for Discharge   Problem: Nutritional: Goal: Maintenance of adequate nutrition will improve Outcome: Adequate for Discharge Goal: Progress toward achieving an optimal weight will improve Outcome: Adequate for Discharge   Problem: Skin Integrity: Goal: Risk for impaired skin integrity will decrease Outcome: Adequate for Discharge   Problem: Tissue Perfusion: Goal: Adequacy of tissue perfusion will improve Outcome: Adequate for Discharge   Problem: Education: Goal: Knowledge of General Education information will improve Description: Including pain rating scale, medication(s)/side effects and non-pharmacologic comfort measures Outcome: Adequate for Discharge   Problem: Health Behavior/Discharge Planning: Goal: Ability to manage health-related needs will improve Outcome: Adequate for Discharge   Problem: Clinical Measurements: Goal: Ability to maintain clinical measurements within normal limits will improve Outcome: Adequate for Discharge Goal: Will remain free from infection Outcome: Adequate for Discharge Goal: Diagnostic test results will improve Outcome: Adequate for Discharge Goal: Respiratory complications will improve Outcome: Adequate for Discharge Goal: Cardiovascular complication will be avoided Outcome: Adequate for Discharge   Problem: Activity: Goal: Risk for activity intolerance will decrease Outcome: Adequate for Discharge   Problem: Nutrition: Goal: Adequate nutrition will be maintained Outcome: Adequate for Discharge   Problem: Coping: Goal: Level of  anxiety will decrease Outcome: Adequate for Discharge   Problem: Elimination: Goal: Will not experience complications related to bowel motility Outcome: Adequate for Discharge Goal: Will not experience complications related to urinary retention Outcome: Adequate for Discharge   Problem: Pain Managment: Goal: General experience of comfort will improve Outcome: Adequate for Discharge   Problem: Safety: Goal: Ability to remain free from injury will improve Outcome: Adequate for Discharge   Problem: Skin Integrity: Goal: Risk for   impaired skin integrity will decrease Outcome: Adequate for Discharge   

## 2022-09-18 NOTE — Progress Notes (Signed)
Occupational Therapy Treatment Patient Details Name: Nathaniel Waters MRN: [id number] DOB: 02/16/1956 Today's Date: 09/18/2022   History of present illness 67 yo male noted to have R UE weakness numbness vision changes and falling to the R. 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 Discussed factors that can reduce stroke risk and patient mentioned, diet, exercises and monitoring BP. Patient give red therapy band and instruction on RUE strengthening. Patient is expected to discharge home today with outpatient OT and patient stating he can get transportation from family and friends.    Recommendations for follow up therapy are one component of a multi-disciplinary discharge planning process, led by the attending physician.  Recommendations may be updated based on patient status, additional functional criteria and insurance authorization.    Assistance Recommended at Discharge PRN  Patient can return home with the following  Assist for transportation   Equipment Recommendations  None recommended by OT    Recommendations for Other Services      Precautions / Restrictions Precautions Precautions: Fall Restrictions Weight Bearing Restrictions: No       Mobility Bed Mobility Overal bed mobility: Modified Independent Bed Mobility: Supine to Sit, Sit to Supine     Supine to sit: Modified independent (Device/Increase time), HOB elevated Sit to supine: Modified independent (Device/Increase time), HOB elevated   General bed mobility comments: no assistance needed with bed mobility    Transfers Overall transfer level: Needs assistance Equipment used: None                     Balance Overall balance assessment: Needs assistance Sitting-balance  support: No upper extremity supported, Feet supported Sitting balance-Leahy Scale: Good     Standing balance support: During functional activity, No upper extremity supported Standing balance-Leahy Scale: Fair Standing balance comment: reaching and stepping tasks performed with no UE support                           ADL either performed or assessed with clinical judgement   ADL Overall ADL's : Needs assistance/impaired                         Toilet Transfer: Modified Independent             General ADL Comments: focused on balance and RUE strengthening    Extremity/Trunk Assessment              Vision       Perception     Praxis      Cognition Arousal/Alertness: Awake/alert Behavior During Therapy: WFL for tasks assessed/performed, Anxious Overall Cognitive Status: Within Functional Limits for tasks assessed                                 General Comments: able to give life changes he will pursue for stroke prevention        Exercises Exercises: General Upper Extremity General Exercises - Upper Extremity Shoulder Flexion: Right, 10 reps, Theraband Theraband Level (Shoulder Flexion): Level 2 (Red) Elbow Flexion: Strengthening, Right, 10 reps, Theraband Theraband Level (Elbow Flexion): Level 2 (Red) Elbow Extension: Strengthening, Right, 10 reps, Theraband Theraband Level (Elbow  Extension): Level 2 (Red) Other Exercises Other Exercises: balance activities performed to increase safety with mobility and self care    Shoulder Instructions       General Comments discussed life style changes to decrease stroke risk    Pertinent Vitals/ Pain       Pain Assessment Pain Assessment: No/denies pain  Home Living                                          Prior Functioning/Environment              Frequency  Min 2X/week        Progress Toward Goals  OT Goals(current goals can now be found  in the care plan section)  Progress towards OT goals: Progressing toward goals  Acute Rehab OT Goals Patient Stated Goal: go home OT Goal Formulation: With patient Time For Goal Achievement: 10/01/22 Potential to Achieve Goals: Good ADL Goals Additional ADL Goal #1: pt will complete pill box test without errors Additional ADL Goal #2: pt will verbalize two things he will change upon d/c to decrease stroke risk factors ( indicates awareness to personal risk factors) Additional ADL Goal #3: pt will complete safety assessment ( fire, oil fire and water spill) with 100 % correct safety responses  Plan Discharge plan remains appropriate    Co-evaluation                 AM-PAC OT "6 Clicks" Daily Activity     Outcome Measure   Help from another person eating meals?: A Little Help from another person taking care of personal grooming?: A Little Help from another person toileting, which includes using toliet, bedpan, or urinal?: A Little Help from another person bathing (including washing, rinsing, drying)?: A Little Help from another person to put on and taking off regular upper body clothing?: A Little Help from another person to put on and taking off regular lower body clothing?: A Little 6 Click Score: 18    End of Session Equipment Utilized During Treatment: Gait belt  OT Visit Diagnosis: Unsteadiness on feet (R26.81);Muscle weakness (generalized) (M62.81);Low vision, both eyes (H54.2)   Activity Tolerance Patient tolerated treatment well   Patient Left in bed;with call bell/phone within reach;with bed alarm set   Nurse Communication Mobility status        Time: 2376-2831 OT Time Calculation (min): 24 min  Charges: OT General Charges $OT Visit: 1 Visit OT Treatments $Therapeutic Activity: 8-22 mins $Therapeutic Exercise: 8-22 mins  Alfonse Flavors, OTA Acute Rehabilitation Services  Office [phone]   Dewain Penning 09/18/2022, 12:16 PM

## 2022-09-18 NOTE — Progress Notes (Signed)
Physical Therapy Treatment Patient Details Name: Nathaniel Waters MRN: [id number] DOB: 1955/12/17 Today's Date: 09/18/2022   History of Present Illness 67 yo male noted to have R UE weakness numbness vision changes and falling to the R. 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 Session focused on gait training without AD & with RW. Pt with improving balance overall but still recommend use of RW at home to reduce fall risk. Pt able to recall compensatory pattern for safe stair negotiation. Also focused on balance retraining & stroke recovery education. Updated d/c recommendations.   Recommendations for follow up therapy are one component of a multi-disciplinary discharge planning process, led by the attending physician.  Recommendations may be updated based on patient status, additional functional criteria and insurance authorization.  Follow Up Recommendations       Assistance Recommended at Discharge Intermittent Supervision/Assistance  Patient can return home with the following A little help with walking and/or transfers;A little help with bathing/dressing/bathroom;Assistance with cooking/housework;Assist for transportation;Help with stairs or ramp for entrance   Equipment Recommendations  None recommended by PT    Recommendations for Other Services       Precautions / Restrictions Precautions Precautions: Fall Restrictions Weight Bearing Restrictions: No     Mobility  Bed Mobility Overal bed mobility: Modified Independent Bed Mobility: Supine to Sit, Sit to Supine     Supine to sit: Modified independent (Device/Increase time), HOB elevated Sit to supine: Modified independent (Device/Increase time), HOB elevated        Transfers   Equipment used: None Transfers: Sit to/from Stand Sit to Stand:  Modified independent (Device/Increase time)           General transfer comment: STS from EOB, recliner without AD    Ambulation/Gait Ambulation/Gait assistance: Min guard, Supervision Gait Distance (Feet): 150 Feet (+ 150 ft) Assistive device: None, Rolling walker (2 wheels) Gait Pattern/deviations: Decreased step length - right, Decreased step length - left, Decreased dorsiflexion - right, Decreased stride length Gait velocity: decreased     General Gait Details: Pt ambulates without AD with CGA with improved balance compared to yesterday but still decreased weight shifting to RLE during stance phase, decreased RUE reciprocal arm swing. Gait training with RW with CGA<>supervision with improved balance today. PT educates pt on importance of ambulating within base of AD, especially when turning. Also educated him on use of RW bag to transport items.   Stairs Stairs: Yes Stairs assistance: Min guard Stair Management: Two rails Number of Stairs: 8 (6") General stair comments: Pt negotiates flight of stairs in stairwell with light CGA with BUE support on B rails. Pt able to recall compensatory pattern. Pt with improved balance compared to yesterday.   Wheelchair Mobility    Modified Rankin (Stroke Patients Only)       Balance Overall balance assessment: Needs assistance Sitting-balance support: No upper extremity supported, Feet supported Sitting balance-Leahy Scale: Good     Standing balance support: During functional activity, No upper extremity supported Standing balance-Leahy Scale: Fair                              Cognition Arousal/Alertness: Awake/alert Behavior During Therapy: WFL for tasks assessed/performed, Anxious Overall Cognitive Status: Within Functional Limits for tasks assessed  General Comments: very pleasant man, appreciative of education        Exercises Other Exercises Other Exercises:  Pt engaged in single leg stance & tandem stance with & without UE support with up to mod assist for balance with tasks focusing on balance retraining. Other Exercises: Educated pt on stroke risk factors & lifestyle modifications to reduce risk of strokes with pt appreciative of information.    General Comments        Pertinent Vitals/Pain Pain Assessment Pain Assessment: No/denies pain    Home Living                          Prior Function            PT Goals (current goals can now be found in the care plan section) Acute Rehab PT Goals Patient Stated Goal: return to independent PLOF PT Goal Formulation: With patient Time For Goal Achievement: 10/01/22 Potential to Achieve Goals: Good Progress towards PT goals: Progressing toward goals    Frequency    Min 4X/week      PT Plan Discharge plan needs to be updated    Co-evaluation              AM-PAC PT "6 Clicks" Mobility   Outcome Measure  Help needed turning from your back to your side while in a flat bed without using bedrails?: None Help needed moving from lying on your back to sitting on the side of a flat bed without using bedrails?: None Help needed moving to and from a bed to a chair (including a wheelchair)?: A Little Help needed standing up from a chair using your arms (e.g., wheelchair or bedside chair)?: None Help needed to walk in hospital room?: A Little Help needed climbing 3-5 steps with a railing? : A Little 6 Click Score: 21    End of Session Equipment Utilized During Treatment: Gait belt Activity Tolerance: Patient tolerated treatment well Patient left: in bed;with call bell/phone within reach;with bed alarm set Nurse Communication: Mobility status PT Visit Diagnosis: Unsteadiness on feet (R26.81);Hemiplegia and hemiparesis;Other abnormalities of gait and mobility (R26.89) Hemiplegia - Right/Left: Right Hemiplegia - caused by: Cerebral infarction     Time: 9528-4132 PT Time  Calculation (min) (ACUTE ONLY): 25 min  Charges:  $Gait Training: 8-22 mins $Therapeutic Activity: 8-22 mins                     Aleda Grana, PT, DPT 09/18/22, 9:17 AM   Sandi Mariscal 09/18/2022, 9:15 AM

## 2022-09-18 NOTE — TOC Transition Note (Signed)
Transition of Care [facility]) - CM/SW Discharge Note   Patient Details  Name: Nathaniel Waters MRN: [id number] Date of Birth: [birth year]  Transition of Care [facility] CM/SW Contact:  Kermit Balo, RN Phone Number: 09/18/2022, 12:20 PM   Clinical Narrative:    Pt is doing better and will discharge home with outpatient therapy through [facility]. Information on the AVS.  No DME needs.  Pt states he has transportation home today.   Final next level of care: OP Rehab Barriers to Discharge: No Barriers Identified   Patient Goals and CMS Choice CMS Medicare.gov Compare Post Acute Care list provided to:: Patient Choice offered to / list presented to : Patient  Discharge Placement                         Discharge Plan and Services Additional resources added to the After Visit Summary for     Discharge Planning Services: CM Consult (walker/ crutches/ shower seat) Post Acute Care Choice: IP Rehab                               Social Determinants of Health (SDOH) Interventions SDOH Screenings   Food Insecurity: No Food Insecurity (09/16/2022)  Housing: Low Risk  (09/16/2022)  Transportation Needs: No Transportation Needs (09/16/2022)  Utilities: Not At Risk (09/16/2022)  Alcohol Screen: Low Risk  (06/10/2022)  Depression (PHQ2-9): Low Risk  (08/05/2022)  Financial Resource Strain: Low Risk  (06/10/2022)  Physical Activity: Sufficiently Active (06/10/2022)  Social Connections: Socially Integrated (06/10/2022)  Stress: No Stress Concern Present (06/10/2022)  Tobacco Use: High Risk (09/16/2022)     Readmission Risk Interventions     No data to display

## 2022-09-26 ENCOUNTER — Telehealth: Payer: Self-pay | Admitting: Pharmacist

## 2022-09-26 NOTE — Progress Notes (Signed)
[facility])  [facility] Pharmacy Team  09/26/2022  Nathaniel Waters 15-Aug-1955 [id number]  Reason for referral: Medication Education  [facility] pharmacy referral is being closed due to the following reasons:  -Spoke with patient and answered his questions regarding his new medication regimen and smoking cessation.  [facility] is happy to assist the patient/family in the future for clinical pharmacy needs, following a discussion from your team about [facility] outreach.   Thank you for allowing [facility] pharmacy to be a part of this patient's care.  Dellie Burns, PharmD Clinical Pharmacist [place]  Direct Dial: [phone]

## 2022-09-30 ENCOUNTER — Ambulatory Visit: Payer: Medicare Other | Attending: Internal Medicine | Admitting: Physical Therapy

## 2022-09-30 ENCOUNTER — Encounter: Payer: Self-pay | Admitting: Occupational Therapy

## 2022-09-30 ENCOUNTER — Other Ambulatory Visit: Payer: Self-pay

## 2022-09-30 ENCOUNTER — Ambulatory Visit: Payer: Medicare Other | Admitting: Occupational Therapy

## 2022-09-30 ENCOUNTER — Encounter: Payer: Self-pay | Admitting: Physical Therapy

## 2022-09-30 VITALS — BP 137/69 | HR 93

## 2022-09-30 DIAGNOSIS — R2689 Other abnormalities of gait and mobility: Secondary | ICD-10-CM | POA: Insufficient documentation

## 2022-09-30 DIAGNOSIS — M6281 Muscle weakness (generalized): Secondary | ICD-10-CM | POA: Diagnosis not present

## 2022-09-30 DIAGNOSIS — R29818 Other symptoms and signs involving the nervous system: Secondary | ICD-10-CM

## 2022-09-30 DIAGNOSIS — R208 Other disturbances of skin sensation: Secondary | ICD-10-CM

## 2022-09-30 DIAGNOSIS — R29898 Other symptoms and signs involving the musculoskeletal system: Secondary | ICD-10-CM | POA: Insufficient documentation

## 2022-09-30 DIAGNOSIS — R2681 Unsteadiness on feet: Secondary | ICD-10-CM | POA: Insufficient documentation

## 2022-09-30 DIAGNOSIS — R278 Other lack of coordination: Secondary | ICD-10-CM

## 2022-09-30 DIAGNOSIS — I69351 Hemiplegia and hemiparesis following cerebral infarction affecting right dominant side: Secondary | ICD-10-CM | POA: Diagnosis not present

## 2022-09-30 DIAGNOSIS — I639 Cerebral infarction, unspecified: Secondary | ICD-10-CM | POA: Diagnosis not present
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
## 2022-10-06 ENCOUNTER — Other Ambulatory Visit: Payer: Self-pay | Admitting: Emergency Medicine

## 2022-10-07 ENCOUNTER — Ambulatory Visit: Payer: Medicare Other | Admitting: Occupational Therapy

## 2022-10-07 ENCOUNTER — Ambulatory Visit: Payer: Medicare Other | Attending: Internal Medicine | Admitting: Physical Therapy

## 2022-10-07 ENCOUNTER — Encounter: Payer: Self-pay | Admitting: Physical Therapy

## 2022-10-07 DIAGNOSIS — R2681 Unsteadiness on feet: Secondary | ICD-10-CM | POA: Diagnosis not present

## 2022-10-07 DIAGNOSIS — I69351 Hemiplegia and hemiparesis following cerebral infarction affecting right dominant side: Secondary | ICD-10-CM

## 2022-10-07 DIAGNOSIS — R29818 Other symptoms and signs involving the nervous system: Secondary | ICD-10-CM | POA: Diagnosis not present

## 2022-10-07 DIAGNOSIS — M6281 Muscle weakness (generalized): Secondary | ICD-10-CM | POA: Diagnosis not present

## 2022-10-07 DIAGNOSIS — R2689 Other abnormalities of gait and mobility: Secondary | ICD-10-CM | POA: Diagnosis not present

## 2022-10-07 DIAGNOSIS — R278 Other lack of coordination: Secondary | ICD-10-CM | POA: Diagnosis not present

## 2022-10-07 DIAGNOSIS — R208 Other disturbances of skin sensation: Secondary | ICD-10-CM | POA: Insufficient documentation

## 2022-10-07 NOTE — Patient Instructions (Signed)
Access Code: [email or web address] URL: [email or web address] Date: 10/07/2022 Prepared by: Amada Kingfisher  Exercises - Wall Push Up  - 1 x daily - 2 sets - 10 reps - Shoulder extension with resistance - Neutral  - 1 x daily - 2 sets - 10 reps - Standing Shoulder Row with Anchored Resistance  - 1 x daily - 2 sets - 10 reps - Single Arm Shoulder Extension with Anchored Resistance  - 1 x daily - 2 sets - 10 reps

## 2022-10-07 NOTE — Patient Instructions (Signed)
Access Code: [email or web address] URL: [email or web address] Date: 10/07/2022 Prepared by: Camille Bal  Exercises - Squat  - 1 x daily - 4-5 x weekly - 2 sets - 10 reps - Lunge with Counter Support  - 1 x daily - 4-5 x weekly - 3 sets - 5 reps - Standing Heel Raise  - 1 x daily - 4-5 x weekly - 2 sets - 12 reps  Walk up to 20 minutes daily-progress based on fatigue (reduce time if poor mechanics or feeling tired)
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
## 2022-10-13 ENCOUNTER — Encounter: Payer: Self-pay | Admitting: Physical Therapy

## 2022-10-13 ENCOUNTER — Ambulatory Visit: Payer: Medicare Other | Admitting: Occupational Therapy

## 2022-10-13 ENCOUNTER — Ambulatory Visit: Payer: Medicare Other | Admitting: Physical Therapy

## 2022-10-13 DIAGNOSIS — R2689 Other abnormalities of gait and mobility: Secondary | ICD-10-CM | POA: Diagnosis not present

## 2022-10-13 DIAGNOSIS — R29818 Other symptoms and signs involving the nervous system: Secondary | ICD-10-CM | POA: Diagnosis not present

## 2022-10-13 DIAGNOSIS — R208 Other disturbances of skin sensation: Secondary | ICD-10-CM

## 2022-10-13 DIAGNOSIS — R278 Other lack of coordination: Secondary | ICD-10-CM

## 2022-10-13 DIAGNOSIS — I69351 Hemiplegia and hemiparesis following cerebral infarction affecting right dominant side: Secondary | ICD-10-CM

## 2022-10-13 DIAGNOSIS — R2681 Unsteadiness on feet: Secondary | ICD-10-CM | POA: Diagnosis not present

## 2022-10-13 DIAGNOSIS — M6281 Muscle weakness (generalized): Secondary | ICD-10-CM

## 2022-10-13 NOTE — Patient Instructions (Signed)
Access Code: [email or web address] URL: [email or web address] Date: 10/13/2022 Prepared by: Camille Bal  Exercises - Squat  - 1 x daily - 4-5 x weekly - 2 sets - 10 reps - Lunge with Counter Support  - 1 x daily - 4-5 x weekly - 3 sets - 5 reps - Standing Heel Raise  - 1 x daily - 4-5 x weekly - 2 sets - 12 reps - Forward Monster Walks  - 1 x daily - 5 x weekly - 3 sets - 10 reps - Backward Monster Walks  - 1 x daily - 5 x weekly - 3 sets - 10 reps - Tandem Walking  - 1 x daily - 5 x weekly - 3 sets - 10 reps - Backward Tandem Walking  - 1 x daily - 5 x weekly - 3 sets - 10 reps
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
## 2022-10-13 NOTE — Patient Instructions (Signed)
  Safety considerations for loss of sensation:   Look at affected hand when using it!   Do NOT use affected arm for anything: sharp, hot, breakable, or too heavy  Always check temperature of water (for showering, washing dishes, etc) with UNaffected arm/extremity  Consider travel mugs w/ lids to transport hot liquids/coffee  Consider alternative options and/or adaptive equipment to make things safer (ex: hand chopper or cut resistant glove for chopping vegetables)   Avoid cold temperatures as well (wear glove in cold temperatures, get ice w/ unaffected extremity)  AVOID handling chemicals and machinery    Re/desensitization Techniques Some ways to Re/desensitize a hyper or hyposensitive ie) numb, tingling limb/area is by rubbing it with different textures. This will make your limb more tolerant/aware of touch and pressure. Before you begin, make sure your hands and the materials you're using are clean.  To rub your painful/sensitive area with different textures: Sit in a comfortable position with the numb/painful/sensitive area uncovered. Start with a material that is soft, such as a cotton ball, silk or soft towel. Rub your painful/sensitive area in all directions. Start with a light pressure and gradually increase the pressure. Vary the textures you use, as you can tolerate them. Start with soft materials like cotton balls or a makeup brush. Progress to materials that are rougher. Examples include a paper towel, cloth towel, wool, or velcro. As you progress, gradually increase the pressure and roughness of the texture you use. Be careful not to rub over any incisions or wounds, if you still have staples or sutures in place, or if there are any open areas. Rub your limb for at least 30 seconds progressing up to a minute or two as often as you can tolerate, or as recommended by your healthcare provider. Be careful not to irritate your skin or rub your skin raw.  Stop rubbing your affected  area if you notice redness that does not dissipate in 15-30 minutes, bleeding or opening skin, and contact your healthcare provider.  Other methods for re/desensitization include: Allow cool or warm water to run over the area.  Be careful not to get the water too hot, especially if you have decreases sensation of temperature. Putting your affected area into a bowl of dry rice, sand, kidney beans, cold water or warm water. You can hide objects in the dry materials to find. Make a bag of miscellaneous matching objects to feel and match based on feel ie) paper clips, nuts, bolts, dice, marbles, checkers, dominos, beads etc.  If you choose to start with the method of dipping your affected area into a medium such as rice, sand, or water make sure to move the affected area around in the bowl until you cannot tolerate it or you reach one to two minutes, whichever comes first. You can use a combination of these methods for 10 to 15 minutes, 3-4 times per day.

## 2022-10-15 ENCOUNTER — Ambulatory Visit (INDEPENDENT_AMBULATORY_CARE_PROVIDER_SITE_OTHER): Payer: Medicare Other | Admitting: Emergency Medicine

## 2022-10-15 ENCOUNTER — Encounter: Payer: Self-pay | Admitting: Emergency Medicine

## 2022-10-15 VITALS — BP 124/70 | HR 105 | Temp 98.3°F | Ht 71.0 in | Wt 225.2 lb

## 2022-10-15 DIAGNOSIS — Z7984 Long term (current) use of oral hypoglycemic drugs: Secondary | ICD-10-CM

## 2022-10-15 DIAGNOSIS — E1169 Type 2 diabetes mellitus with other specified complication: Secondary | ICD-10-CM

## 2022-10-15 DIAGNOSIS — I152 Hypertension secondary to endocrine disorders: Secondary | ICD-10-CM

## 2022-10-15 DIAGNOSIS — Z8673 Personal history of transient ischemic attack (TIA), and cerebral infarction without residual deficits: Secondary | ICD-10-CM | POA: Insufficient documentation

## 2022-10-15 DIAGNOSIS — E785 Hyperlipidemia, unspecified: Secondary | ICD-10-CM

## 2022-10-15 DIAGNOSIS — E1159 Type 2 diabetes mellitus with other circulatory complications: Secondary | ICD-10-CM

## 2022-10-15 DIAGNOSIS — Z09 Encounter for follow-up examination after completed treatment for conditions other than malignant neoplasm: Secondary | ICD-10-CM | POA: Diagnosis not present

## 2022-10-15 DIAGNOSIS — F17219 Nicotine dependence, cigarettes, with unspecified nicotine-induced disorders: Secondary | ICD-10-CM

## 2022-10-15 LAB — POCT GLYCOSYLATED HEMOGLOBIN (HGB A1C): Hemoglobin A1C: 8.2 % — AB (ref 4.0–5.6)

## 2022-10-15 MED ORDER — ACCU-CHEK GUIDE W/DEVICE KIT
PACK | 0 refills | Status: DC
Start: 2022-10-15 — End: 2023-05-11

## 2022-10-15 MED ORDER — TRULICITY 0.75 MG/0.5ML ~~LOC~~ SOAJ
0.7500 mg | SUBCUTANEOUS | 3 refills | Status: DC
Start: 2022-10-15 — End: 2022-11-17

## 2022-10-15 MED ORDER — CLOPIDOGREL BISULFATE 75 MG PO TABS
75.0000 mg | ORAL_TABLET | Freq: Every day | ORAL | 0 refills | Status: DC
Start: 2022-10-15 — End: 2022-10-18

## 2022-10-15 NOTE — Assessment & Plan Note (Signed)
Rosuvastatin dose recently increased to 40 mg daily Secondary stroke prevention measures discussed

## 2022-10-15 NOTE — Assessment & Plan Note (Signed)
Well-controlled hypertension off medication at present time Not taking losartan Uncontrolled diabetes with hemoglobin A1c at 8.2 Presently taking Farxiga 10 mg daily and glipizide 5 mg in the morning Recommend to start Trulicity 0.75 weekly Stroke secondary prevention discussed Diet and nutrition discussed

## 2022-10-15 NOTE — Assessment & Plan Note (Signed)
No smoking for the past month. Still using nicotine patches

## 2022-10-15 NOTE — Progress Notes (Signed)
Nathaniel Waters 67 y.o.   Chief Complaint  Patient presents with   Medical Management of Chronic Issues    F/u appt, patient states he had a stroke at the end of last month, patient states he is doing ok, going through PT now     HISTORY OF PRESENT ILLNESS: This is a 67 y.o. male here for follow-up of chronic medical conditions and follow-up of hospital admission on 09/16/2022 when he was admitted with a stroke. Overall doing better.  Has follow-up appointment with neurologist next month. Hospital discharge summary as follows: Physician Discharge Summary    Patient: Nathaniel Waters MRN: [id number] DOB: 02/27/1956  Admit date:     09/16/2022  Discharge date: 09/18/22  Discharge Physician: Lurene Shadow    PCP: Georgina Quint, MD    Recommendations at discharge:    Follow-up with PCP in 1 to 2 weeks Follow-up with neurologist within 1 month of discharge   Discharge Diagnoses: Principal Problem:   Acute CVA (cerebrovascular accident) [facility]) Active Problems:   Hypertension associated with diabetes (HCC)   Current smoker   Resolved Problems:   * No resolved hospital problems. [facility] Course:   Mr. Nathaniel Waters is a 67 y.o. male  with medical history significant of tobacco abuse, reflux, diabetes mellitus 2 not on insulin, dyslipidemia, hypertension, chronic hep C previously treated with medications at our local ID clinic, and BPH no longer on medications.  He presented to the hospital with right-sided weakness and numbness, headache, vision changes and balance issues.  He took a nap around 1 PM the day prior to admission.  When he woke up from his nap he noticed the numbness and weakness in his right upper extremity.  Initially, he attributed this to sleeping on his hand. In the ED he was afebrile and mildly hypertensive with a blood pressure of 149/77, mildly tachypneic with a normal heart rate.  Room air sats were 95%.  Stat CT of the head without contrast showed no infarct or  intracranial hemorrhage.  MRI brain without contrast revealed punctate acute infarctions affecting the right posterior lateral medulla and the inferior right cerebellum without swelling or hemorrhage.  Based on the MRI images there appears to be flow in the major posterior circulation vessels.        He was admitted to the hospital for acute stroke.  He was started on aspirin, Plavix and statins.       Assessment and Plan:     Acute stroke (punctate infarct in the right posterolateral medulla inferior right cerebellum: Continue aspirin, Plavix and statin.  2D echo showed EF estimated at 60 to 65%, mild concentric LVH, grade 1 diastolic dysfunction, no shunt. Patient did well with PT and OT and outpatient PT and OT were recommended.  He said someone will be able to drive him to his therapy appointments.     Hyperlipidemia: Continue ezetimibe and rosuvastatin. Total cholesterol 228, HDL 36, LDL 70     Type II DM: Continue glipizide and dapagliflozin.  Hemoglobin A1c was 6.1 on 08/05/2022.     Tobacco use disorder: Counseled to quit smoking.  Continue nicotine patch.     Other comorbidities include BPH, chronic hepatitis C (treated in the past)     His condition has improved and he is deemed stable for discharge to home today.    HPI   Prior to Admission medications   Medication Sig Start Date End Date Taking? Authorizing Provider  aspirin EC 81 MG  tablet Take 1 tablet (81 mg total) by mouth daily. Swallow whole. 09/19/22 09/14/23 Yes Lurene Shadow, MD  clopidogrel (PLAVIX) 75 MG tablet Take 1 tablet (75 mg total) by mouth daily. 09/19/22  Yes Lurene Shadow, MD  ezetimibe (ZETIA) 10 MG tablet Take 1 tablet (10 mg total) by mouth daily. 09/19/22  Yes Lurene Shadow, MD  FARXIGA 10 MG TABS tablet TAKE 1 TABLET BY MOUTH EVERY DAY 07/21/22  Yes Azara Gemme, Eilleen Kempf, MD  glipiZIDE (GLUCOTROL XL) 5 MG 24 hr tablet TAKE 1 TABLET BY MOUTH EVERY DAY WITH BREAKFAST 10/06/22  Yes Karyna Bessler, Eilleen Kempf, MD  Lancets (ACCU-CHEK MULTICLIX) lancets Use to help check blood sugars twice a day 05/12/17  Yes Dragnev, Alphonse Guild, NP  nicotine (NICODERM CQ - DOSED IN MG/24 HOURS) 14 mg/24hr patch Place 1 patch (14 mg total) onto the skin daily. 09/19/22  Yes Lurene Shadow, MD  rosuvastatin (CRESTOR) 40 MG tablet Take 1 tablet (40 mg total) by mouth daily. 09/18/22  Yes Lurene Shadow, MD  valsartan-hydrochlorothiazide (DIOVAN-HCT) 160-12.5 MG tablet Take 1 tablet by mouth daily. Patient not taking: Reported on 10/15/2022 08/05/22   Georgina Quint, MD    Allergies  Allergen Reactions   Metformin And Related Diarrhea   Jardiance [Empagliflozin] Rash    Patient Active Problem List   Diagnosis Date Noted   Acute CVA (cerebrovascular accident) (HCC) 09/16/2022   Current smoker 11/18/2021   BPH (benign prostatic hyperplasia) 09/27/2021   BPH with obstruction/lower urinary tract symptoms 09/18/2021   Chronic hepatitis C without hepatic coma (HCC) 04/11/2019   Hypertension associated with diabetes (HCC) 04/09/2017   Cigarette nicotine dependence with nicotine-induced disorder 04/09/2017   Dyslipidemia associated with type 2 diabetes mellitus (HCC) 10/23/2014   Dyslipidemia 09/22/2014   Elevated PSA 09/22/2014   GERD 12/30/2007   Type II diabetes mellitus, uncontrolled 12/29/2007   TUBULOVILLOUS ADENOMA, COLON, HX OF 12/29/2007    Past Medical History:  Diagnosis Date   Diabetes mellitus    Diabetes mellitus without complication (HCC)    GERD (gastroesophageal reflux disease)    Hyperlipidemia    Hypertension    Pneumonia     Past Surgical History:  Procedure Laterality Date   CARPAL TUNNEL RELEASE     both    COLONOSCOPY  2018   TRANSURETHRAL RESECTION OF PROSTATE N/A 09/27/2021   Procedure: TRANSURETHRAL RESECTION OF THE PROSTATE (TURP), BIPOLAR;  Surgeon: Jannifer Hick, MD;  Location: [facility];  Service: Urology;  Laterality: N/A;    Social History   Socioeconomic  History   Marital status: Legally Separated    Spouse name: Not on file   Number of children: 4   Years of education: 11   Highest education level: Not on file  Occupational History   Occupation: Loading [employer]  Tobacco Use   Smoking status: Every Day    Packs/day: .25    Types: Cigarettes   Smokeless tobacco: Never   Tobacco comments:    Actively in the process of quitting  Vaping Use   Vaping Use: Never used  Substance and Sexual Activity   Alcohol use: Yes    Comment: occasionally on weekend   Drug use: No   Sexual activity: Not on file  Other Topics Concern   Not on file  Social History Narrative   ** Merged History Encounter **       Fun: Listen to music and be around family and friends. Denies religious beliefs effecting health care.  Social Determinants of Health   Financial Resource Strain: Low Risk  (06/10/2022)   Overall Financial Resource Strain (CARDIA)    Difficulty of Paying Living Expenses: Not hard at all  Food Insecurity: No Food Insecurity (09/16/2022)   Hunger Vital Sign    Worried About Running Out of Food in the Last Year: Never true    Ran Out of Food in the Last Year: Never true  Transportation Needs: No Transportation Needs (09/16/2022)   PRAPARE - Administrator, Civil Service (Medical): No    Lack of Transportation (Non-Medical): No  Physical Activity: Sufficiently Active (06/10/2022)   Exercise Vital Sign    Days of Exercise per Week: 5 days    Minutes of Exercise per Session: 30 min  Stress: No Stress Concern Present (06/10/2022)   [employer] of Occupational Health - Occupational Stress Questionnaire    Feeling of Stress : Not at all  Social Connections: Socially Integrated (06/10/2022)   Social Connection and Isolation Panel [NHANES]    Frequency of Communication with Friends and Family: More than three times a week    Frequency of Social Gatherings with Friends and Family: More than three times a week    Attends Religious  Services: More than 4 times per year    Active Member of [employer] or Organizations: Yes    Attends Engineer, structural: More than 4 times per year    Marital Status: Married  Catering manager Violence: Not At Risk (09/16/2022)   Humiliation, Afraid, Rape, and Kick questionnaire    Fear of Current or Ex-Partner: No    Emotionally Abused: No    Physically Abused: No    Sexually Abused: No    Family History  Problem Relation Age of Onset   Breast cancer Mother    Heart disease Brother    Lung cancer Father    Healthy Paternal Grandmother    Cancer Brother    Colon cancer Neg Hx    Esophageal cancer Neg Hx    Stomach cancer Neg Hx    Rectal cancer Neg Hx    Colon polyps Neg Hx      Review of Systems  Constitutional: Negative.  Negative for chills and fever.  HENT: Negative.  Negative for congestion and sore throat.   Respiratory: Negative.  Negative for cough and shortness of breath.   Cardiovascular: Negative.  Negative for chest pain and palpitations.  Gastrointestinal:  Negative for abdominal pain, nausea and vomiting.  Genitourinary: Negative.  Negative for dysuria and hematuria.  Skin: Negative.  Negative for rash.  Neurological: Negative.  Negative for dizziness and headaches.  All other systems reviewed and are negative.   Vitals:   10/15/22 1059  BP: 124/70  Pulse: (!) 105  Temp: 98.3 F (36.8 C)  SpO2: 98%    Physical Exam Vitals reviewed.  Constitutional:      Appearance: Normal appearance.  HENT:     Head: Normocephalic.     Mouth/Throat:     Mouth: Mucous membranes are moist.     Pharynx: Oropharynx is clear.  Eyes:     Extraocular Movements: Extraocular movements intact.     Conjunctiva/sclera: Conjunctivae normal.     Pupils: Pupils are equal, round, and reactive to light.  Cardiovascular:     Rate and Rhythm: Normal rate and regular rhythm.     Pulses: Normal pulses.     Heart sounds: Normal heart sounds.  Pulmonary:     Effort:  Pulmonary effort  is normal.     Breath sounds: Normal breath sounds.  Abdominal:     Palpations: Abdomen is soft.     Tenderness: There is no abdominal tenderness.  Musculoskeletal:     Cervical back: No tenderness.  Lymphadenopathy:     Cervical: No cervical adenopathy.  Skin:    General: Skin is warm and dry.     Capillary Refill: Capillary refill takes less than 2 seconds.  Neurological:     General: No focal deficit present.     Mental Status: He is alert and oriented to person, place, and time.  Psychiatric:        Mood and Affect: Mood normal.        Behavior: Behavior normal.      ASSESSMENT & PLAN: A total of 48 minutes was spent with the patient and counseling/coordination of care regarding preparing for this visit, review of most recent office visit notes, review of recent hospital discharge summary, review of multiple chronic medical conditions and their management, review of most recent blood work results including interpretation of today's hemoglobin A1c, review of all medications and changes made, secondary stroke prevention measures discussed, education on nutrition, prognosis, documentation and need for follow-up.  Problem List Items Addressed This Visit       Cardiovascular and Mediastinum   Hypertension associated with diabetes (HCC) - Primary    Well-controlled hypertension off medication at present time Not taking losartan Uncontrolled diabetes with hemoglobin A1c at 8.2 Presently taking Farxiga 10 mg daily and glipizide 5 mg in the morning Recommend to start Trulicity 0.75 weekly Stroke secondary prevention discussed Diet and nutrition discussed      Relevant Medications   Blood Glucose Monitoring Suppl (ACCU-CHEK GUIDE) w/Device KIT   Dulaglutide (TRULICITY) 0.75 MG/0.5ML SOPN   Other Relevant Orders   POCT HgB A1C (Completed)     Endocrine   Dyslipidemia associated with type 2 diabetes mellitus (HCC)    Rosuvastatin dose recently increased to 40  mg daily Secondary stroke prevention measures discussed      Relevant Medications   Blood Glucose Monitoring Suppl (ACCU-CHEK GUIDE) w/Device KIT   Dulaglutide (TRULICITY) 0.75 MG/0.5ML SOPN   Other Relevant Orders   POCT HgB A1C (Completed)     Nervous and Auditory   Cigarette nicotine dependence with nicotine-induced disorder    No smoking for the past month. Still using nicotine patches        Other   History of stroke    Clinically stable. Secondary stroke prevention measures discussed Diet and nutrition discussed Importance of blood pressure, diabetes, and cholesterol control addressed Continues on daily baby aspirin and Plavix 75 mg daily Has follow-up appointment with neurologist next month      Relevant Medications   clopidogrel (PLAVIX) 75 MG tablet   Other Visit Diagnoses     Hospital discharge follow-up          Patient Instructions  Diabetes Mellitus and Nutrition, Adult When you have diabetes, or diabetes mellitus, it is very important to have healthy eating habits because your blood sugar (glucose) levels are greatly affected by what you eat and drink. Eating healthy foods in the right amounts, at about the same times every day, can help you: Manage your blood glucose. Lower your risk of heart disease. Improve your blood pressure. Reach or maintain a healthy weight. What can affect my meal plan? Every person with diabetes is different, and each person has different needs for a meal plan. Your health care  provider may recommend that you work with a dietitian to make a meal plan that is best for you. Your meal plan may vary depending on factors such as: The calories you need. The medicines you take. Your weight. Your blood glucose, blood pressure, and cholesterol levels. Your activity level. Other health conditions you have, such as heart or kidney disease. How do carbohydrates affect me? Carbohydrates, also called carbs, affect your blood glucose level  more than any other type of food. Eating carbs raises the amount of glucose in your blood. It is important to know how many carbs you can safely have in each meal. This is different for every person. Your dietitian can help you calculate how many carbs you should have at each meal and for each snack. How does alcohol affect me? Alcohol can cause a decrease in blood glucose (hypoglycemia), especially if you use insulin or take certain diabetes medicines by mouth. Hypoglycemia can be a life-threatening condition. Symptoms of hypoglycemia, such as sleepiness, dizziness, and confusion, are similar to symptoms of having too much alcohol. Do not drink alcohol if: Your health care provider tells you not to drink. You are pregnant, may be pregnant, or are planning to become pregnant. If you drink alcohol: Limit how much you have to: 0-1 drink a day for women. 0-2 drinks a day for men. Know how much alcohol is in your drink. In the [place] one drink equals one 12 oz bottle of beer (355 mL), one 5 oz glass of wine (148 mL), or one 1 oz glass of hard liquor (44 mL). Keep yourself hydrated with water, diet soda, or unsweetened iced tea. Keep in mind that regular soda, juice, and other mixers may contain a lot of sugar and must be counted as carbs. What are tips for following this plan?  Reading food labels Start by checking the serving size on the Nutrition Facts label of packaged foods and drinks. The number of calories and the amount of carbs, fats, and other nutrients listed on the label are based on one serving of the item. Many items contain more than one serving per package. Check the total grams (g) of carbs in one serving. Check the number of grams of saturated fats and trans fats in one serving. Choose foods that have a low amount or none of these fats. Check the number of milligrams (mg) of salt (sodium) in one serving. Most people should limit total sodium intake to less than 2,300 mg per  day. Always check the nutrition information of foods labeled as "low-fat" or "nonfat." These foods may be higher in added sugar or refined carbs and should be avoided. Talk to your dietitian to identify your daily goals for nutrients listed on the label. Shopping Avoid buying canned, pre-made, or processed foods. These foods tend to be high in fat, sodium, and added sugar. Shop around the outside edge of the grocery store. This is where you will most often find fresh fruits and vegetables, bulk grains, fresh meats, and fresh dairy products. Cooking Use low-heat cooking methods, such as baking, instead of high-heat cooking methods, such as deep frying. Cook using healthy oils, such as olive, canola, or sunflower oil. Avoid cooking with butter, cream, or high-fat meats. Meal planning Eat meals and snacks regularly, preferably at the same times every day. Avoid going long periods of time without eating. Eat foods that are high in fiber, such as fresh fruits, vegetables, beans, and whole grains. Eat 4-6 oz (112-168 g) of lean  protein each day, such as lean meat, chicken, fish, eggs, or tofu. One ounce (oz) (28 g) of lean protein is equal to: 1 oz (28 g) of meat, chicken, or fish. 1 egg.  cup (62 g) of tofu. Eat some foods each day that contain healthy fats, such as avocado, nuts, seeds, and fish. What foods should I eat? Fruits Berries. Apples. Oranges. Peaches. Apricots. Plums. Grapes. Mangoes. Papayas. Pomegranates. Kiwi. Cherries. Vegetables Leafy greens, including lettuce, spinach, kale, chard, collard greens, mustard greens, and cabbage. Beets. Cauliflower. Broccoli. Carrots. Green beans. Tomatoes. Peppers. Onions. Cucumbers. Brussels sprouts. Grains Whole grains, such as whole-wheat or whole-grain bread, crackers, tortillas, cereal, and pasta. Unsweetened oatmeal. Quinoa. Brown or wild rice. Meats and other proteins Seafood. Poultry without skin. Lean cuts of poultry and beef. Tofu.  Nuts. Seeds. Dairy Low-fat or fat-free dairy products such as milk, yogurt, and cheese. The items listed above may not be a complete list of foods and beverages you can eat and drink. Contact a dietitian for more information. What foods should I avoid? Fruits Fruits canned with syrup. Vegetables Canned vegetables. Frozen vegetables with butter or cream sauce. Grains Refined white flour and flour products such as bread, pasta, snack foods, and cereals. Avoid all processed foods. Meats and other proteins Fatty cuts of meat. Poultry with skin. Breaded or fried meats. Processed meat. Avoid saturated fats. Dairy Full-fat yogurt, cheese, or milk. Beverages Sweetened drinks, such as soda or iced tea. The items listed above may not be a complete list of foods and beverages you should avoid. Contact a dietitian for more information. Questions to ask a health care provider Do I need to meet with a certified diabetes care and education specialist? Do I need to meet with a dietitian? What number can I call if I have questions? When are the best times to check my blood glucose? Where to find more information: American Diabetes Association: diabetes.org Academy of Nutrition and Dietetics: eatright.[employer] of Diabetes and Digestive and Kidney Diseases: StageSync.si Association of Diabetes Care & Education Specialists: diabeteseducator.org Summary It is important to have healthy eating habits because your blood sugar (glucose) levels are greatly affected by what you eat and drink. It is important to use alcohol carefully. A healthy meal plan will help you manage your blood glucose and lower your risk of heart disease. Your health care provider may recommend that you work with a dietitian to make a meal plan that is best for you. This information is not intended to replace advice given to you by your health care provider. Make sure you discuss any questions you have with your health  care provider. Document Revised: 10/26/2019 Document Reviewed: 10/26/2019 Elsevier Patient Education  2024 Elsevier Inc.    Edwina Barth, MD [facility]

## 2022-10-15 NOTE — Assessment & Plan Note (Signed)
Clinically stable. Secondary stroke prevention measures discussed Diet and nutrition discussed Importance of blood pressure, diabetes, and cholesterol control addressed Continues on daily baby aspirin and Plavix 75 mg daily Has follow-up appointment with neurologist next month

## 2022-10-15 NOTE — Patient Instructions (Signed)

## 2022-10-17 ENCOUNTER — Telehealth: Payer: Self-pay | Admitting: Emergency Medicine

## 2022-10-17 NOTE — Telephone Encounter (Signed)
Prescription Request  10/17/2022  LOV: 10/15/2022  What is the name of the medication or equipment? Rosuvastatin, Zetia, and nicotine patch 14 mg.  Have you contacted your pharmacy to request a refill? Yes   Which pharmacy would you like this sent to?  [id number] - [place], [place] - [address] AT [facility] [address]  [place] [address] Phone: [phone] Fax: [phone]     Patient notified that their request is being sent to the clinical staff for review and that they should receive a response within 2 business days.   Please advise at Mobile [phone] (mobile)

## 2022-10-18 ENCOUNTER — Other Ambulatory Visit: Payer: Self-pay | Admitting: Emergency Medicine

## 2022-10-18 DIAGNOSIS — E785 Hyperlipidemia, unspecified: Secondary | ICD-10-CM

## 2022-10-18 DIAGNOSIS — Z8673 Personal history of transient ischemic attack (TIA), and cerebral infarction without residual deficits: Secondary | ICD-10-CM

## 2022-10-18 DIAGNOSIS — E1169 Type 2 diabetes mellitus with other specified complication: Secondary | ICD-10-CM

## 2022-10-18 MED ORDER — EZETIMIBE 10 MG PO TABS: 10.0000 mg | ORAL_TABLET | Freq: Every day | ORAL | 3 refills | Status: DC

## 2022-10-18 MED ORDER — NICOTINE 14 MG/24HR TD PT24: 14.0000 mg | MEDICATED_PATCH | Freq: Every day | TRANSDERMAL | 1 refills | Status: DC

## 2022-10-18 MED ORDER — ROSUVASTATIN CALCIUM 40 MG PO TABS: 40.0000 mg | ORAL_TABLET | Freq: Every day | ORAL | 3 refills | Status: DC

## 2022-10-18 NOTE — Telephone Encounter (Signed)
New prescriptions sent to pharmacy of record today.  Thanks.

## 2022-10-20 ENCOUNTER — Ambulatory Visit: Payer: Medicare Other | Admitting: Occupational Therapy

## 2022-10-20 ENCOUNTER — Encounter (HOSPITAL_COMMUNITY): Payer: Self-pay

## 2022-10-20 ENCOUNTER — Ambulatory Visit: Payer: Medicare Other | Admitting: Physical Therapy

## 2022-10-20 ENCOUNTER — Ambulatory Visit (HOSPITAL_COMMUNITY)
Admission: EM | Admit: 2022-10-20 | Discharge: 2022-10-20 | Disposition: A | Discharge status: Home / Self Care | Payer: Medicare Other | Attending: Family Medicine | Admitting: Family Medicine

## 2022-10-20 DIAGNOSIS — L03012 Cellulitis of left finger: Secondary | ICD-10-CM

## 2022-10-20 MED ORDER — DOXYCYCLINE HYCLATE 100 MG PO CAPS: 100.0000 mg | ORAL_CAPSULE | Freq: Two times a day (BID) | ORAL | 0 refills | Status: DC

## 2022-10-20 NOTE — ED Provider Notes (Signed)
[facility]   [id number] 10/20/22 Arrival Time: 0946  ASSESSMENT & PLAN:  1. Paronychia of finger of left hand     Incision and Drainage Procedure Note  Anesthesia: PainEaze topical  Procedure Details  The procedure, risks and complications have been discussed in detail (including, but not limited to pain and bleeding) with the patient.  The skin induration was prepped and draped in the usual fashion. After adequate topical anesthesia, small incision made with a #11 blade on the left 3rd finger nailfold with purulent drainage.  EBL: minimal Drains: none Packing: n/a Condition: Tolerated procedure well Complications: none.  Meds ordered this encounter  Medications   doxycycline (VIBRAMYCIN) 100 MG capsule    Sig: Take 1 capsule (100 mg total) by mouth 2 (two) times daily.    Dispense:  14 capsule    Refill:  0   OTC analgesics as needed. Wound care instructions.   Follow-up Information     [facility] at [facility].   Specialty: Urgent Care Why: If worsening or failing to improve as anticipated. Contact information: [address] [place] [address] [phone]        Georgina Quint, MD.   Specialty: Internal Medicine Why: As needed. Contact information: [address] [place] [address] [phone]                  Reviewed expectations re: course of current medical issues. Questions answered. Outlined signs and symptoms indicating need for more acute intervention. Patient verbalized understanding. After Visit Summary given.   SUBJECTIVE:  Nathaniel Waters is a 67 y.o. male who presents with a possible infection of his left 3rd finger at nail. Onset gradual, approximately 3 days ago without active drainage and without active bleeding. Is painful. No tx PTA.  OBJECTIVE:  Vitals:   10/20/22 1042  BP: 113/72  Pulse: 88  Resp: 16  Temp: 98.2 F (36.8 C)  TempSrc: Oral  SpO2: 97%      General appearance: alert; no distress LEFT 3rd finger: approx sub-cm induration at proximal central nailfold; tender to touch; no active drainage or bleeding; normal capillary refill Psychological: alert and cooperative; normal mood and affect  Allergies  Allergen Reactions   Metformin And Related Diarrhea   Jardiance [Empagliflozin] Rash    Past Medical History:  Diagnosis Date   Diabetes mellitus    Diabetes mellitus without complication (HCC)    GERD (gastroesophageal reflux disease)    Hyperlipidemia    Hypertension    Pneumonia    Social History   Socioeconomic History   Marital status: Legally Separated    Spouse name: Not on file   Number of children: 4   Years of education: 11   Highest education level: Not on file  Occupational History   Occupation: Loading [employer]  Tobacco Use   Smoking status: Former    Current packs/day: 0.00    Types: Cigarettes    Quit date: 2024    Years since quitting: 0.5   Smokeless tobacco: Never   Tobacco comments:    Actively in the process of quitting  Vaping Use   Vaping status: Never Used  Substance and Sexual Activity   Alcohol use: Yes    Comment: occasionally on weekend   Drug use: No   Sexual activity: Not on file  Other Topics Concern   Not on file  Social History Narrative   ** Merged History Encounter **  Fun: Listen to music and be around family and friends. Denies religious beliefs effecting health care.    Social Determinants of Health   Financial Resource Strain: Low Risk  (06/10/2022)   Overall Financial Resource Strain (CARDIA)    Difficulty of Paying Living Expenses: Not hard at all  Food Insecurity: No Food Insecurity (09/16/2022)   Hunger Vital Sign    Worried About Running Out of Food in the Last Year: Never true    Ran Out of Food in the Last Year: Never true  Transportation Needs: No Transportation Needs (09/16/2022)   PRAPARE - Administrator, Civil Service (Medical): No     Lack of Transportation (Non-Medical): No  Physical Activity: Sufficiently Active (06/10/2022)   Exercise Vital Sign    Days of Exercise per Week: 5 days    Minutes of Exercise per Session: 30 min  Stress: No Stress Concern Present (06/10/2022)   [employer] of Occupational Health - Occupational Stress Questionnaire    Feeling of Stress : Not at all  Social Connections: Socially Integrated (06/10/2022)   Social Connection and Isolation Panel [NHANES]    Frequency of Communication with Friends and Family: More than three times a week    Frequency of Social Gatherings with Friends and Family: More than three times a week    Attends Religious Services: More than 4 times per year    Active Member of [employer] or Organizations: Yes    Attends Engineer, structural: More than 4 times per year    Marital Status: Married   Family History  Problem Relation Age of Onset   Breast cancer Mother    Heart disease Brother    Lung cancer Father    Healthy Paternal Grandmother    Cancer Brother    Colon cancer Neg Hx    Esophageal cancer Neg Hx    Stomach cancer Neg Hx    Rectal cancer Neg Hx    Colon polyps Neg Hx    Past Surgical History:  Procedure Laterality Date   CARPAL TUNNEL RELEASE     both    COLONOSCOPY  2018   TRANSURETHRAL RESECTION OF PROSTATE N/A 09/27/2021   Procedure: TRANSURETHRAL RESECTION OF THE PROSTATE (TURP), BIPOLAR;  Surgeon: Jannifer Hick, MD;  Location: [facility];  Service: Urology;  Laterality: N/AMardella Layman, MD 10/20/22 1249

## 2022-10-20 NOTE — ED Triage Notes (Signed)
Here for L ring finger pain and swelling x 3 days.

## 2022-10-27 ENCOUNTER — Ambulatory Visit: Payer: Medicare Other | Admitting: Physical Therapy

## 2022-10-27 ENCOUNTER — Ambulatory Visit: Payer: Medicare Other | Admitting: Occupational Therapy

## 2022-10-27 ENCOUNTER — Encounter: Payer: Self-pay | Admitting: Physical Therapy

## 2022-10-27 DIAGNOSIS — R2689 Other abnormalities of gait and mobility: Secondary | ICD-10-CM | POA: Diagnosis not present

## 2022-10-27 DIAGNOSIS — R208 Other disturbances of skin sensation: Secondary | ICD-10-CM

## 2022-10-27 DIAGNOSIS — R278 Other lack of coordination: Secondary | ICD-10-CM

## 2022-10-27 DIAGNOSIS — R2681 Unsteadiness on feet: Secondary | ICD-10-CM

## 2022-10-27 DIAGNOSIS — M6281 Muscle weakness (generalized): Secondary | ICD-10-CM | POA: Diagnosis not present

## 2022-10-27 DIAGNOSIS — R29818 Other symptoms and signs involving the nervous system: Secondary | ICD-10-CM

## 2022-10-27 DIAGNOSIS — I69351 Hemiplegia and hemiparesis following cerebral infarction affecting right dominant side: Secondary | ICD-10-CM

## 2022-10-27 NOTE — Patient Instructions (Addendum)
Post-stroke sensory deficits and re-education Patient information A-Z handout printed and provided to patient:  [email or web address]
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
## 2022-10-27 NOTE — Therapy (Signed)
OUTPATIENT OCCUPATIONAL THERAPY NEURO TREATMENT  Patient Name: Nathaniel Waters MRN: [id number] DOB:1955/08/24, 67 y.o., male Today's Date: 10/27/2022  PCP: Georgina Quint, MD  REFERRING PROVIDER: Lurene Shadow, MD  END OF SESSION:  OT End of Session - 10/27/22 1101     Visit Number 4    Number of Visits 9    Date for OT Re-Evaluation 11/28/22    Authorization Type [employer] Medicare: Follows Medicare guidelines    OT Start Time 1102    OT Stop Time 1145    OT Time Calculation (min) 43 min    Equipment Utilized During Treatment FM materials    Activity Tolerance Patient tolerated treatment well    Behavior During Therapy WFL for tasks assessed/performed              Past Medical History:  Diagnosis Date   Diabetes mellitus    Diabetes mellitus without complication (HCC)    GERD (gastroesophageal reflux disease)    Hyperlipidemia    Hypertension    Pneumonia    Past Surgical History:  Procedure Laterality Date   CARPAL TUNNEL RELEASE     both    COLONOSCOPY  2018   TRANSURETHRAL RESECTION OF PROSTATE N/A 09/27/2021   Procedure: TRANSURETHRAL RESECTION OF THE PROSTATE (TURP), BIPOLAR;  Surgeon: Jannifer Hick, MD;  Location: [facility];  Service: Urology;  Laterality: N/A;   Patient Active Problem List   Diagnosis Date Noted   History of stroke 10/15/2022   Current smoker 11/18/2021   BPH (benign prostatic hyperplasia) 09/27/2021   BPH with obstruction/lower urinary tract symptoms 09/18/2021   Chronic hepatitis C without hepatic coma (HCC) 04/11/2019   Hypertension associated with diabetes (HCC) 04/09/2017   Cigarette nicotine dependence with nicotine-induced disorder 04/09/2017   Dyslipidemia associated with type 2 diabetes mellitus (HCC) 10/23/2014   Dyslipidemia 09/22/2014   Elevated PSA 09/22/2014   GERD 12/30/2007   Type II diabetes mellitus, uncontrolled 12/29/2007   TUBULOVILLOUS ADENOMA, COLON, HX OF 12/29/2007    ONSET DATE: referral: 09/18/2022 onset:  09/16/22  REFERRING DIAG:  Diagnosis  I63.9 (ICD-10-CM) - Cerebrovascular accident (CVA), unspecified mechanism (    THERAPY DIAG:  Other lack of coordination  Other disturbances of skin sensation  Other symptoms and signs involving the nervous system  Muscle weakness (generalized)  Rationale for Evaluation and Treatment: Rehabilitation  SUBJECTIVE:   SUBJECTIVE STATEMENT:   Patient reported that he checks is BP and blood sugar daily. He is not taking Trulicity due to cost of medication and is encouraged to ensure his primary care physician is aware.  He reports that he has been going to the gym and getting on treadmill and working out.  He is driving, shopping (still not cooking like he used to) but is using the air fryer & microwave.  He still has numbness in R UE - and reports is is "about the same."  Finally patient reports he is almost done his antibiotic for an infection around his L ring fingernail.  Pt accompanied by: self   PERTINENT HISTORY:   PMHx: tobacco abuse, reflux, diabetes mellitus 2 (farxiga & glipizide ~ couple of years), dyslipidemia, hypertension, chronic hep C previously treated with medications, and BPH (no longer on medications).  He presented to the hospital 09/16/22 with right-sided weakness and numbness, headache, vision changes and balance issues.  He took a nap around 1 PM that day prior to admission.  When he woke up from his nap he noticed the numbness and weakness in  his right upper extremity and falling to the R. Imaging revealed punctate acute infarction affecting the right posterior lateral medulla and inferior right cerebellum.  PRECAUTIONS: Fall  WEIGHT BEARING RESTRICTIONS: No  PAIN:  Are you having pain? No  FALLS: Has patient fallen in last 6 months? Yes. Number of falls 1 when he had his stroke ie) fell against the wall and slid to the floor  LIVING ENVIRONMENT:  Lives with: lives alone Lives in: Apartment Stairs: Yes: External:  approx 14 steps; can reach both Has following equipment at home: Dan Humphreys - 2 wheeled, shower chair, and [employer]  Had an accident several years ago and his son bought him several items but the shower chair is still in the box and the walker and crutches are in the closet.  PLOF: Independent, Leisure: [employer] 3-4 miles/day x 3 days/week, and still drove 2017 [employer] car (brother brought him to appt today)  PATIENT GOALS: to use his right hand better and not to drop things  OBJECTIVE:   HAND DOMINANCE: Right  ADLs: Overall ADLs: Patient has returned home and is performing ADLs and home management on his own. Transfers/ambulation related to ADLs: MI with and without cane Eating: Some trouble cutting certain foods ie) trouble cutting a melon the other day Grooming: Shaving - he reports difficulty smoothly shaving ie) overshoots with R hand, put on too much shaving cream and wiped it on his lips etc UB Dressing: Patient reports MI LB Dressing: Hardest thing has been putting on his shoes but he's getting by Toileting: Independent Bathing: Patient has gotten in the shower to bath since getting home - just moves slower. Tub Shower transfers: Holds the wall to step into the tub and pulls the shower curtain Equipment:  Not using any equipment at this time  IADLs: Shopping: Brother and/or sons (2 locally) have been helping with shopping Light housekeeping: Taking his time with homemaking Meal Prep: Uses air fryer and microwave (will fry eggs). Reports inability to prepare his Sunday meal like he did before. Community mobility: Has [employer] for use in public Medication management: I - opens his pill bottles (does not use pill box) Financial management: I Handwriting: 75% legible, Increased time, and patient is unable to 'write', currently printing and not please with his clarity  MOBILITY STATUS: Independent and has cane for public mobiltiy  POSTURE COMMENTS:  No Significant  postural limitations Sitting balance:  WFL  ACTIVITY TOLERANCE: Activity tolerance: Significantly decreased ie) PLOF walked 3 days a week x 4 miles and did weights but not able to do this now.  He also notes he is sleeping much more than usual ie) 8+ hours at night recently compared to 5 hours previously and will still nod off watching TV etc.    FUNCTIONAL OUTCOME MEASURES: PSFS Score 5.7 on 10/06/22   UPPER EXTREMITY ROM:    Active ROM Right eval Left eval  Shoulder flexion slow WFL  Shoulder abduction slow   Shoulder adduction    Shoulder extension Decreased end range   Shoulder internal rotation Hurts to turn inwards to reach waistband   Shoulder external rotation    Elbow flexion    Elbow extension    Wrist flexion End range decreased   Wrist extension End range decreased   Wrist ulnar deviation    Wrist radial deviation    Wrist pronation    Wrist supination End range decreased   (Blank rows = not tested)  UPPER EXTREMITY MMT:  MMT Right eval Left eval  Shoulder flexion Grossly 4/5 Grossly 5/5  Shoulder abduction    Shoulder adduction    Shoulder extension    Shoulder internal rotation    Shoulder external rotation    Middle trapezius    Lower trapezius    Elbow flexion    Elbow extension    Wrist flexion    Wrist extension    Wrist ulnar deviation    Wrist radial deviation    Wrist pronation    Wrist supination    (Blank rows = not tested)  HAND FUNCTION: Grip strength: Right: 81.3 - 88.1 lbs; Left: 75.8 - 76.0  lbs  COORDINATION: 9 Hole Peg test: Right: 28.88  sec; Left: 25.81 sec  SENSATION:  Patient reports he has difficulty with numbness in R arm/hand ie) couldn't feel the dynamometer very well, drops items he is holding and has trouble with specific identification of objects with stereognosis. Light touch: Impaired  Stereognosis: Impaired  Light touch ~ 5 mm with R index finger; Stereognosis - grossly intact but difficulty identifying  specific items ie) block versus dice, penny verus nickel.  EDEMA: NA  MUSCLE TONE: WFL  COGNITION: Overall cognitive status: Within functional limits for tasks assessed  VISION: Subjective report: Patient reports getting lightheaded but no real changes in his vision since his stroke.  He does have to wear his glasses all the time. Baseline vision:  Progressive lenses x 5 years s/p only using reading glasses previously Visual history:  NA  VISION ASSESSMENT: Tracking/Visual pursuits: Decreased smoothness with horizontal tracking and Decreased smoothness with vertical tracking  Patient has difficulty with following activities due to following visual impairments: NA  PERCEPTION: WFL  PRAXIS: WFL  OBSERVATIONS at EVAL: Pt ambulates with and without use of Hurri Cane as noted by patient carrying the can rather than using it during evaluation with only minor balance deficits. The pt appears well kept including a well shaved goatee, and appropriate attire.  He has difficulty with R UE awareness and motor coordination as noted by dropping some pegs and misjudging placement of pegs a couple of times. He also reported decreased awareness of items in his hand including dropping items at home.   TODAY'S TREATMENT:                                                                                                                               Therapeutic Activities:   Reviewed and provided new handouts Post-stroke sensory deficits and re-education especially to continue stimulation for loss of light touch Looking at affected hand when using it Using non-affected hand to check temperatures Provided frequent and varied sensory stimulation to affected arm/hand Continuing to work on locating matching objects by feel    Coordination Exercise handout with images provided for various activities to work on R UE finger ROM, dexterity and isolated movements with demonstration and practice, as well as  modification, and cues to increase difficulty  and appropriateness for sensory stimulation.    Rotate ball in fingertips (clockwise and counter-clockwise).   Shuffling and Flipping cards.   Pick up coins, dominoes, buttons, marbles, dried beans/pasta of different sizes ... To place in containers To stack  To pick up items one at a time until he gets 10+ in his hand and then move item from palm to fingertips to release.  -- He is encouraged to complete in hand manipulation of various objects with vision occluded.   Twirl pen between fingers.   Tear a piece of paper towel and roll it into small balls.    He is encourage to continue to stimulate R UE with various sensory objects/items and to work on tasks with vision occluded to work on Therapist, music also.     PATIENT EDUCATION: Education details: sensory stimulation; coordination activities Person educated: Patient Education method: Explanation, Demonstration, and Verbal cues Education comprehension: verbalized understanding, returned demonstration, and needs further education  HOME EXERCISE PROGRAM: Eval - Encouraged to work on Diplomatic Services operational officer and using R UE 10/07/22 Access Code: [email or web address] (theraband exercises) - 1 x daily - 2 sets - 10 reps 10/13/22 - Hidden objects in putty 10/27/22 - coordination handout with images   GOALS: Goals reviewed with patient? Yes  SHORT TERM GOALS: Target date: 10/28/22  Patient will demonstrate UE ROM/coordination and sensory HEP with 25% verbal cues or less for proper execution.  Baseline: New CVA Goal status: In PROGRESS  2.  Patient will demonstrate improved Stereognosis to identify different items in his pocket and different coins with vision occluded.  Baseline: Grossly intact but difficulty identifying specific items ie) block versus dice, penny verus nickel. Goal status: In PROGRESS  3.  Patient will improve pinch strength/coordination & sensation on R hand to increase ease & independence in  tying shoelaces, and manage buttons with vision occluded Baseline: Self reported difficulty. Goal status: In PROGRESS  4.  Patient will complete nine-hole peg with use of RUE in 25 seconds or less without overshooting holes.  Baseline: 28.88 seconds with decreased accuracy Goal status: In PROGRESS  5. Patient will demonstrate increased ease with RUE internal rotation for LB dressing without pain. Baseline: Pain with motion. Goal Status: In PROGRESS  LONG TERM GOALS: Target date: 11/28/22  Patient will report no dropping of objects from R UE x 1 week. Baseline: Patient has to use 2 hands to hold items after dropping dinner plate with RUE. Goal status: IN PROGRESS  2.  Patient will report increased ease with 10 repetitions of multiple UE exercises per PLOF at the gym. Baseline: Not comfortable exercising in case he drops items Goal status: In PROGRESS  3.  Improve touch sensation & calibration of grasp on R hand so patient can effectively grasp a paper/styrofoam cup w/o spilling and/or maintain hold of objects throughout his home w/o slipping out of his hand. Baseline: Performing tasks bilaterally at eval for safety. Goal status: In PROGRESS  4.  Patient will report at least two-point increase in average PSFS score or at least three-point increase in a single activity score indicating functionally significant improvement given minimum detectable change.  Baseline: 5.7 total score (See above for individual activity scores)  Goal status: INITIAL   ASSESSMENT:  CLINICAL IMPRESSION:  Patient seen today for OT due to mostly sensory deficits on RUE s/p recent CVA. He is given further education re: compensating and stimulating his affected side as well as coordination activities to work on with R UE with vision and with  vision occluded to progress challenge of task.  He continues to benefit from skilled OT services in the outpatient setting to work on impairments as noted below to help pt  return to PLOF as able.      PERFORMANCE DEFICITS: in functional skills including ADLs, IADLs, coordination, dexterity, proprioception, sensation, Fine motor control, Gross motor control, endurance, decreased knowledge of use of DME, and UE functional use.  IMPAIRMENTS: are limiting patient from ADLs, IADLs, and leisure.   CO-MORBIDITIES: may have co-morbidities  that affects occupational performance. Patient will benefit from skilled OT to address above impairments and improve overall function.   REHAB POTENTIAL: Excellent   PLAN:  OT FREQUENCY: 1x/week  OT DURATION: 8 weeks  PLANNED INTERVENTIONS: self care/ADL training, therapeutic exercise, therapeutic activity, neuromuscular re-education, balance training, and DME and/or AE instructions  RECOMMENDED OTHER SERVICES: Patient received PT evaluation prior to OT visit today and will begin PT weekly treatment (1x/week x 6 weeks).  CONSULTED AND AGREED WITH PLAN OF CARE: Patient  PLAN FOR NEXT SESSION:  Reassess goals.  Progress HEPs for sensory stimulation.  Weight bearing for increased R UE awareness. Circuit training to return to the gym.   Cutting food, picking up pot/pans, getting objects out of the fridge. Graded pouring and use of knife.   Victorino Sparrow, OT 10/27/2022, 12:01 PM

## 2022-11-03 ENCOUNTER — Ambulatory Visit: Payer: Medicare Other | Admitting: Occupational Therapy

## 2022-11-03 ENCOUNTER — Ambulatory Visit: Payer: Medicare Other | Admitting: Physical Therapy

## 2022-11-03 ENCOUNTER — Encounter: Payer: Self-pay | Admitting: Physical Therapy

## 2022-11-03 DIAGNOSIS — R2689 Other abnormalities of gait and mobility: Secondary | ICD-10-CM | POA: Diagnosis not present

## 2022-11-03 DIAGNOSIS — R278 Other lack of coordination: Secondary | ICD-10-CM

## 2022-11-03 DIAGNOSIS — R208 Other disturbances of skin sensation: Secondary | ICD-10-CM | POA: Diagnosis not present

## 2022-11-03 DIAGNOSIS — I69351 Hemiplegia and hemiparesis following cerebral infarction affecting right dominant side: Secondary | ICD-10-CM | POA: Diagnosis not present

## 2022-11-03 DIAGNOSIS — R29818 Other symptoms and signs involving the nervous system: Secondary | ICD-10-CM

## 2022-11-03 DIAGNOSIS — M6281 Muscle weakness (generalized): Secondary | ICD-10-CM | POA: Diagnosis not present

## 2022-11-03 DIAGNOSIS — R2681 Unsteadiness on feet: Secondary | ICD-10-CM | POA: Diagnosis not present
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
## 2022-11-10 ENCOUNTER — Ambulatory Visit: Payer: Medicare Other | Admitting: Occupational Therapy

## 2022-11-10 ENCOUNTER — Ambulatory Visit: Payer: Medicare Other | Attending: Internal Medicine | Admitting: Physical Therapy

## 2022-11-10 ENCOUNTER — Encounter: Payer: Self-pay | Admitting: Physical Therapy

## 2022-11-10 VITALS — BP 127/78 | HR 93

## 2022-11-10 DIAGNOSIS — R29818 Other symptoms and signs involving the nervous system: Secondary | ICD-10-CM | POA: Insufficient documentation

## 2022-11-10 DIAGNOSIS — R278 Other lack of coordination: Secondary | ICD-10-CM | POA: Insufficient documentation

## 2022-11-10 DIAGNOSIS — R2689 Other abnormalities of gait and mobility: Secondary | ICD-10-CM | POA: Insufficient documentation

## 2022-11-10 DIAGNOSIS — R208 Other disturbances of skin sensation: Secondary | ICD-10-CM

## 2022-11-10 DIAGNOSIS — M6281 Muscle weakness (generalized): Secondary | ICD-10-CM | POA: Diagnosis not present

## 2022-11-10 DIAGNOSIS — R2681 Unsteadiness on feet: Secondary | ICD-10-CM | POA: Insufficient documentation

## 2022-11-10 DIAGNOSIS — I69351 Hemiplegia and hemiparesis following cerebral infarction affecting right dominant side: Secondary | ICD-10-CM | POA: Diagnosis not present
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
## 2022-11-10 NOTE — Therapy (Signed)
OUTPATIENT OCCUPATIONAL THERAPY NEURO TREATMENT  Patient Name: Nathaniel Waters MRN: [id number] DOB:09-08-1955, 67 y.o., male Today's Date: 11/10/2022  PCP: Georgina Quint, MD  REFERRING PROVIDER: Lurene Shadow, MD  END OF SESSION:  OT End of Session - 11/10/22 1107     Visit Number 6    Number of Visits 9    Date for OT Re-Evaluation 11/28/22    Authorization Type [employer] Medicare: Follows Medicare guidelines    OT Start Time 1105    OT Stop Time 1145    OT Time Calculation (min) 40 min    Equipment Utilized During Treatment FM materials    Activity Tolerance Patient tolerated treatment well    Behavior During Therapy WFL for tasks assessed/performed              Past Medical History:  Diagnosis Date   Diabetes mellitus    Diabetes mellitus without complication (HCC)    GERD (gastroesophageal reflux disease)    Hyperlipidemia    Hypertension    Pneumonia    Past Surgical History:  Procedure Laterality Date   CARPAL TUNNEL RELEASE     both    COLONOSCOPY  2018   TRANSURETHRAL RESECTION OF PROSTATE N/A 09/27/2021   Procedure: TRANSURETHRAL RESECTION OF THE PROSTATE (TURP), BIPOLAR;  Surgeon: Jannifer Hick, MD;  Location: [facility];  Service: Urology;  Laterality: N/A;   Patient Active Problem List   Diagnosis Date Noted   History of stroke 10/15/2022   Current smoker 11/18/2021   BPH (benign prostatic hyperplasia) 09/27/2021   BPH with obstruction/lower urinary tract symptoms 09/18/2021   Chronic hepatitis C without hepatic coma (HCC) 04/11/2019   Hypertension associated with diabetes (HCC) 04/09/2017   Cigarette nicotine dependence with nicotine-induced disorder 04/09/2017   Dyslipidemia associated with type 2 diabetes mellitus (HCC) 10/23/2014   Dyslipidemia 09/22/2014   Elevated PSA 09/22/2014   GERD 12/30/2007   Type II diabetes mellitus, uncontrolled 12/29/2007   TUBULOVILLOUS ADENOMA, COLON, HX OF 12/29/2007    ONSET DATE: referral: 09/18/2022 onset:  09/16/22  REFERRING DIAG:  Diagnosis  I63.9 (ICD-10-CM) - Cerebrovascular accident (CVA), unspecified mechanism (    THERAPY DIAG:  Other disturbances of skin sensation  Other symptoms and signs involving the nervous system  Other lack of coordination  Rationale for Evaluation and Treatment: Rehabilitation  SUBJECTIVE:   SUBJECTIVE STATEMENT:   Patient reports walking on the treadmill about 3.5 miles/day at the gym and doing some weights and feeling okay.  However the numbness in R UE remains about the same from the shoulder down to his hand.  Pt accompanied by: self   PERTINENT HISTORY:   PMHx: tobacco abuse, reflux, diabetes mellitus 2 (farxiga & glipizide ~ couple of years), dyslipidemia, hypertension, chronic hep C previously treated with medications, and BPH (no longer on medications).  He presented to the hospital 09/16/22 with right-sided weakness and numbness, headache, vision changes and balance issues.  He took a nap around 1 PM that day prior to admission.  When he woke up from his nap he noticed the numbness and weakness in his right upper extremity and falling to the R. Imaging revealed punctate acute infarction affecting the right posterior lateral medulla and inferior right cerebellum.  PRECAUTIONS: Fall  WEIGHT BEARING RESTRICTIONS: No  PAIN:  Are you having pain? No  FALLS: Has patient fallen in last 6 months? Yes. Number of falls 1 when he had his stroke ie) fell against the wall and slid to the floor  LIVING  ENVIRONMENT:  Lives with: lives alone Lives in: Apartment Stairs: Yes: External: approx 14 steps; can reach both Has following equipment at home: Dan Humphreys - 2 wheeled, shower chair, and [employer]  Had an accident several years ago and his son bought him several items but the shower chair is still in the box and the walker and crutches are in the closet.  PLOF: Independent, Leisure: [employer] 3-4 miles/day x 3 days/week, and still drove 2017  [employer] car (brother brought him to appt today)  PATIENT GOALS: to use his right hand better and not to drop things  OBJECTIVE:   HAND DOMINANCE: Right  ADLs: Overall ADLs: Patient has returned home and is performing ADLs and home management on his own. Transfers/ambulation related to ADLs: MI with and without cane Eating: Some trouble cutting certain foods ie) trouble cutting a melon the other day Grooming: Shaving - he reports difficulty smoothly shaving ie) overshoots with R hand, put on too much shaving cream and wiped it on his lips etc UB Dressing: Patient reports MI LB Dressing: Hardest thing has been putting on his shoes but he's getting by Toileting: Independent Bathing: Patient has gotten in the shower to bath since getting home - just moves slower. Tub Shower transfers: Holds the wall to step into the tub and pulls the shower curtain Equipment:  Not using any equipment at this time  IADLs: Shopping: Brother and/or sons (2 locally) have been helping with shopping Light housekeeping: Taking his time with homemaking Meal Prep: Uses air fryer and microwave (will fry eggs). Reports inability to prepare his Sunday meal like he did before. Community mobility: Has [employer] for use in public Medication management: I - opens his pill bottles (does not use pill box) Financial management: I Handwriting: 75% legible, Increased time, and patient is unable to 'write', currently printing and not please with his clarity  MOBILITY STATUS: Independent and has cane for public mobiltiy  POSTURE COMMENTS:  No Significant postural limitations Sitting balance:  WFL  ACTIVITY TOLERANCE: Activity tolerance: Significantly decreased ie) PLOF walked 3 days a week x 4 miles and did weights but not able to do this now.  He also notes he is sleeping much more than usual ie) 8+ hours at night recently compared to 5 hours previously and will still nod off watching TV etc.    FUNCTIONAL OUTCOME  MEASURES: PSFS Score 5.7 on 10/06/22  11/03/22 - PSFS Score 8.0 on 11/03/22  UPPER EXTREMITY ROM:    Active ROM Right eval Left eval  Shoulder flexion slow WFL  Shoulder abduction slow   Shoulder adduction    Shoulder extension Decreased end range   Shoulder internal rotation Hurts to turn inwards to reach waistband   Shoulder external rotation    Elbow flexion    Elbow extension    Wrist flexion End range decreased   Wrist extension End range decreased   Wrist ulnar deviation    Wrist radial deviation    Wrist pronation    Wrist supination End range decreased   (Blank rows = not tested)  UPPER EXTREMITY MMT:     MMT Right eval Left eval  Shoulder flexion Grossly 4/5 Grossly 5/5  Shoulder abduction    Shoulder adduction    Shoulder extension    Shoulder internal rotation    Shoulder external rotation    Middle trapezius    Lower trapezius    Elbow flexion    Elbow extension    Wrist flexion  Wrist extension    Wrist ulnar deviation    Wrist radial deviation    Wrist pronation    Wrist supination    (Blank rows = not tested)  HAND FUNCTION: Grip strength: Right: 81.3 - 88.1 lbs; Left: 75.8 - 76.0  lbs  COORDINATION: 9 Hole Peg test: Right: 28.88  sec; Left: 25.81 sec  SENSATION:  Patient reports he has difficulty with numbness in R arm/hand ie) couldn't feel the dynamometer very well, drops items he is holding and has trouble with specific identification of objects with stereognosis. Light touch: Impaired  Stereognosis: Impaired  Light touch ~ 5 mm with R index finger; Stereognosis - grossly intact but difficulty identifying specific items ie) block versus dice, penny verus nickel.  EDEMA: NA  MUSCLE TONE: WFL  COGNITION: Overall cognitive status: Within functional limits for tasks assessed  VISION: Subjective report: Patient reports getting lightheaded but no real changes in his vision since his stroke.  He does have to wear his glasses all the  time. Baseline vision:  Progressive lenses x 5 years s/p only using reading glasses previously Visual history:  NA  VISION ASSESSMENT: Tracking/Visual pursuits: Decreased smoothness with horizontal tracking and Decreased smoothness with vertical tracking  Patient has difficulty with following activities due to following visual impairments: NA  PERCEPTION: WFL  PRAXIS: WFL  OBSERVATIONS at EVAL: Pt ambulates with and without use of Hurri Cane as noted by patient carrying the can rather than using it during evaluation with only minor balance deficits. The pt appears well kept including a well shaved goatee, and appropriate attire.  He has difficulty with R UE awareness and motor coordination as noted by dropping some pegs and misjudging placement of pegs a couple of times. He also reported decreased awareness of items in his hand including dropping items at home.   TODAY'S TREATMENT:                                                                                                                               Therapeutic Activities  Desensitization Activities:    Patient is able to report various textures he is using at home to help with stimulation and other options were explored today and he is encouraged to continue with visualization and positive talk   He is encouraged to vary objects and task, difficulty of tasks (with and without vision) and to incorporate sensory stimulation during different activities throughout the week   Examples of desensitization textures you can use: ? Brushing: use a hair brush or combing in circular or sweeping motions over the area ? Tapping: use your hand to tap or pat the area ? Towel rubs: use a dry towel and rub the area in circular or sweeping motions ? Massage: massage the area with your hands, may use lotion ? Vibration: apply home massager or other vibration tool over area ? Light touch: gently "tickle" the area with your fingertips  Education provided on applying different textures with different motions light stroking, firm stroking, tapping and circular motions according to what she can tolerate.    Using somatosensory feedback ie) telling self what the surface property is, looking at the texture and exploring the texture with intact areas.     Using mental imagery and verbal/mental feedback re: texture etc when she applies them to affected digits.  Other motor coordination activities introduced to improve B coordination, sensory awareness of R UE and ability to grade motor control ie)  Midline drawing with B markers and bilateral digital isolation to pick up and release pens individually between matching fingers on both sides.  PATIENT EDUCATION: Education details: Sensitization activities; coordination activities Person educated: Patient Education method: Programmer, multimedia, Facilities manager, and Verbal cues Education comprehension: verbalized understanding, returned demonstration, and needs further education  HOME EXERCISE PROGRAM: Eval - Encouraged to work on Diplomatic Services operational officer and using R UE 10/07/22 Access Code: [email or web address] (theraband exercises) - 1 x daily - 2 sets - 10 reps 10/13/22 - Hidden objects in putty 10/27/22 - coordination handout with images 11/10/22 - Home Sensitization Program   GOALS: Goals reviewed with patient? Yes  SHORT TERM GOALS: Target date: 10/28/22  Patient will demonstrate UE ROM/coordination and sensory HEP with 25% verbal cues or less for proper execution.  Baseline: New CVA Goal status: MET   2.  Patient will demonstrate improved Stereognosis to identify different items in his pocket and different coins with vision occluded.  Baseline: Grossly intact but difficulty identifying specific items ie) block versus dice, penny verus nickel. Goal status: In PROGRESS  3.  Patient will improve pinch strength/coordination & sensation on R hand to increase ease & independence in tying shoelaces, and manage buttons  with vision occluded Baseline: Self reported difficulty. Goal status: MET  4.  Patient will complete nine-hole peg with use of RUE in 25 seconds or less without overshooting holes.  Baseline: 28.88 seconds with decreased accuracy Goal status: MET 11/03/22: 25.80 seconds   5. Patient will demonstrate increased ease with RUE internal rotation for LB dressing without pain. Baseline: Pain with motion. Goal Status: MET  LONG TERM GOALS: Target date: 11/28/22  Patient will report no dropping of objects from R UE x 1 week. Baseline: Patient has to use 2 hands to hold items after dropping dinner plate with RUE. Goal status: IN PROGRESS  2.  Patient will report increased ease with 10 repetitions of multiple UE exercises per PLOF at the gym. Baseline: Not comfortable exercising in case he drops items Goal status: MET  3.  Improve touch sensation & calibration of grasp on R hand so patient can effectively grasp a paper/styrofoam cup w/o spilling and/or maintain hold of objects throughout his home w/o slipping out of his hand. Baseline: Performing tasks bilaterally at eval for safety. Goal status: MET  4.  Patient will report at least two-point increase in average PSFS score or at least three-point increase in a single activity score indicating functionally significant improvement given minimum detectable change. Baseline: 5.7 total score (See above for individual activity scores)  Goal status: INITIAL   ASSESSMENT:  CLINICAL IMPRESSION:  Patient seen today for OT due to ongoing sensory deficits on RUE s/p recent CVA. He is given further education re: BUE coordination (ie. Using both sides together), sensory stimulation and motor control through ongoing sensory activities with various textrues and practicing functional activities with his affected side.  He continues to benefit from skilled OT services in the outpatient setting to  work on impairments as noted below to help pt return to PLOF as  able.      PERFORMANCE DEFICITS: in functional skills including ADLs, IADLs, coordination, dexterity, proprioception, sensation, Fine motor control, Gross motor control, endurance, decreased knowledge of use of DME, and UE functional use.  IMPAIRMENTS: are limiting patient from ADLs, IADLs, and leisure.   CO-MORBIDITIES: may have co-morbidities  that affects occupational performance. Patient will benefit from skilled OT to address above impairments and improve overall function.   REHAB POTENTIAL: Excellent   PLAN:  OT FREQUENCY: 1x/week  OT DURATION: 8 weeks  PLANNED INTERVENTIONS: self care/ADL training, therapeutic exercise, therapeutic activity, neuromuscular re-education, balance training, and DME and/or AE instructions  RECOMMENDED OTHER SERVICES: Patient received PT evaluation prior to OT visit today and will begin PT weekly treatment (1x/week x 6 weeks).  CONSULTED AND AGREED WITH PLAN OF CARE: Patient  PLAN FOR NEXT SESSION:   Rice bin and ice/heat home sensory program.  Stereognosis assessment - Progress HEPs for sensory stimulation.  Weight bearing for increased R UE awareness. Circuit training to return to the gym.   Picking up similar objects of different weights - Cutting food, picking up pot/pans, getting objects out of the fridge. Graded pouring and use of knife.   Victorino Sparrow, OT 11/10/2022, 5:10 PM

## 2022-11-13 NOTE — Progress Notes (Signed)
[facility] [address] [place]. [place] [address] [phone]       HOSPITAL FOLLOW UP NOTE  Mr. Nathaniel Waters Date of Birth:  04/26/1955 Medical Record Number:  [id number]   Reason for Referral:  hospital stroke follow up    SUBJECTIVE:   CHIEF COMPLAINT:  Chief Complaint  Patient presents with   Follow-up    Rm 8, here alone Pt is here for hospital follow up after stroke. Pt states he has been doing well, however states he is still having residual numbness on right arm/hand.     HPI:   Nathaniel Waters is a 67 y.o. male with a past medical history of HTN, diabetes, HLD, GERD, current tobacco and occasional EtOH use who presented to ED on 09/16/2022 with right hand numbness and weakness, headache, vision changes, and balance issues.  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  Recommended DAPT for 3 months then aspirin alone as well as increase home dose Crestor from 20 mg to 40 mg daily and continuation of Zetia, LDL 70.  A1c 6.1 on glipizide and farxiga.  Smoking cessation counseling provided.      Today, 11/17/2022, patient is being seen for initial hospital follow-up unaccompanied.  Reports doing well since discharge without new stroke/TIA symptoms but still having right arm numbness without much improvement since discharge. Denies residual weakness, denies pain.  Interferes with writing and difficulty picking up or holding objects.  Currently working with OT.  Completed PT, denies residual gait or ambulation difficulty.  Remains on DAPT, Crestor 40 mg daily and Zetia without side effects Repeat A1c 8.2 on 7/10, PCP recommend initiating Trulicity but patient unable to afford.  Continues Farxiga and glipizide.  He has been working on diet and exercise open this will improve glucose levels. Plans to f/u in Oct for repeat lab work Routinely  follows with PCP for stroke risk factor management Denies tobacco use since discharge, smoked for 50 years, is concerned regarding increased appetite since quitting smoking.  Use of EtOH only on occasion, denies recreational drug use.     PERTINENT IMAGING  CT head:   Normal for age noncontrast CT appearance of the brain.  CTA head & neck Right PICA is patent proximally but is diminutive and not well  visualized distal to its origin. MRI  Punctate acute infarctions affecting the right posterolateral medulla and the inferior right cerebellum.  2D Echo: EF 60 to 65%, mild concentric LVH, grade 1 diastolic dysfunction, no shunt LDL 70 HgbA1c 6.1 (07/2022)    ROS:   14 system review of systems performed and negative with exception of those listed in HPI  PMH:  Past Medical History:  Diagnosis Date   Diabetes mellitus    Diabetes mellitus without complication (HCC)    GERD (gastroesophageal reflux disease)    Hyperlipidemia    Hypertension    Pneumonia     PSH:  Past Surgical History:  Procedure Laterality Date   CARPAL TUNNEL RELEASE     both    COLONOSCOPY  2018   TRANSURETHRAL RESECTION OF PROSTATE N/A 09/27/2021   Procedure: TRANSURETHRAL RESECTION OF THE PROSTATE (TURP), BIPOLAR;  Surgeon: Jannifer Hick, MD;  Location: [facility];  Service: Urology;  Laterality: N/A;    Social History:  Social History   Socioeconomic History   Marital status: Legally Separated    Spouse name: Not on file  Number of children: 4   Years of education: 11   Highest education level: Not on file  Occupational History   Occupation: Loading Dock  Tobacco Use   Smoking status: Former    Current packs/day: 0.00    Types: Cigarettes    Quit date: 2024    Years since quitting: 0.6   Smokeless tobacco: Never   Tobacco comments:    Actively in the process of quitting  Vaping Use   Vaping status: Never Used  Substance and Sexual Activity   Alcohol use: Yes    Comment: occasionally on  weekend   Drug use: No   Sexual activity: Not on file  Other Topics Concern   Not on file  Social History Narrative   ** Merged History Encounter **       Fun: Listen to music and be around family and friends. Denies religious beliefs effecting health care.    Social Determinants of Health   Financial Resource Strain: Low Risk  (06/10/2022)   Overall Financial Resource Strain (CARDIA)    Difficulty of Paying Living Expenses: Not hard at all  Food Insecurity: No Food Insecurity (09/16/2022)   Hunger Vital Sign    Worried About Running Out of Food in the Last Year: Never true    Ran Out of Food in the Last Year: Never true  Transportation Needs: No Transportation Needs (09/16/2022)   PRAPARE - Administrator, Civil Service (Medical): No    Lack of Transportation (Non-Medical): No  Physical Activity: Sufficiently Active (06/10/2022)   Exercise Vital Sign    Days of Exercise per Week: 5 days    Minutes of Exercise per Session: 30 min  Stress: No Stress Concern Present (06/10/2022)   [employer] of Occupational Health - Occupational Stress Questionnaire    Feeling of Stress : Not at all  Social Connections: Socially Integrated (06/10/2022)   Social Connection and Isolation Panel [NHANES]    Frequency of Communication with Friends and Family: More than three times a week    Frequency of Social Gatherings with Friends and Family: More than three times a week    Attends Religious Services: More than 4 times per year    Active Member of [employer] or Organizations: Yes    Attends Engineer, structural: More than 4 times per year    Marital Status: Married  Catering manager Violence: Not At Risk (09/16/2022)   Humiliation, Afraid, Rape, and Kick questionnaire    Fear of Current or Ex-Partner: No    Emotionally Abused: No    Physically Abused: No    Sexually Abused: No    Family History:  Family History  Problem Relation Age of Onset   Breast cancer Mother    Heart  disease Brother    Lung cancer Father    Healthy Paternal Grandmother    Cancer Brother    Colon cancer Neg Hx    Esophageal cancer Neg Hx    Stomach cancer Neg Hx    Rectal cancer Neg Hx    Colon polyps Neg Hx     Medications:   Current Outpatient Medications on File Prior to Visit  Medication Sig Dispense Refill   aspirin EC 81 MG tablet Take 1 tablet (81 mg total) by mouth daily. Swallow whole.     Blood Glucose Monitoring Suppl (ACCU-CHEK GUIDE) w/Device KIT Use as directed to check blood glucose up to 2 times a day 1 kit 0   clopidogrel (PLAVIX) 75  MG tablet TAKE 1 TABLET BY MOUTH EVERY DAY 30 tablet 0   ezetimibe (ZETIA) 10 MG tablet Take 1 tablet (10 mg total) by mouth daily. 90 tablet 3   FARXIGA 10 MG TABS tablet TAKE 1 TABLET BY MOUTH EVERY DAY 90 tablet 1   glipiZIDE (GLUCOTROL XL) 5 MG 24 hr tablet TAKE 1 TABLET BY MOUTH EVERY DAY WITH BREAKFAST 90 tablet 1   Lancets (ACCU-CHEK MULTICLIX) lancets Use to help check blood sugars twice a day 102 each 5   rosuvastatin (CRESTOR) 40 MG tablet Take 1 tablet (40 mg total) by mouth daily. 90 tablet 3   valsartan-hydrochlorothiazide (DIOVAN-HCT) 160-12.5 MG tablet Take 1 tablet by mouth daily. 90 tablet 3   Dulaglutide (TRULICITY) 0.75 MG/0.5ML SOPN Inject 0.75 mg into the skin once a week. (Patient not taking: Reported on 11/17/2022) 2 mL 3   nicotine (NICODERM CQ - DOSED IN MG/24 HOURS) 14 mg/24hr patch Place 1 patch (14 mg total) onto the skin daily. (Patient not taking: Reported on 11/17/2022) 28 patch 1   No current facility-administered medications on file prior to visit.    Allergies:   Allergies  Allergen Reactions   Metformin And Related Diarrhea   Jardiance [Empagliflozin] Rash      OBJECTIVE:  Physical Exam  Vitals:   11/17/22 0913  BP: 130/81  Pulse: 83  Weight: 219 lb (99.3 kg)  Height: 5\' 11"  (1.803 m)   Body mass index is 30.54 kg/m. No results found.  Poststroke PHQ 2/9    10/15/2022   11:03 AM   Depression screen PHQ 2/9  Decreased Interest 0  Down, Depressed, Hopeless 0  PHQ - 2 Score 0     General: well developed, well nourished, very pleasant middle-age Afro-American male, seated, in no evident distress Head: head normocephalic and atraumatic.   Neck: supple with no carotid or supraclavicular bruits Cardiovascular: regular rate and rhythm, no murmurs Musculoskeletal: no deformity Skin:  no rash/petichiae Vascular:  Normal pulses all extremities   Neurologic Exam Mental Status: Awake and fully alert.  Fluent speech and language.  Oriented to place and time. Recent and remote memory intact. Attention span, concentration and fund of knowledge appropriate. Mood and affect appropriate.  Cranial Nerves: Pupils equal, briskly reactive to light. Extraocular movements full without nystagmus. Visual fields full to confrontation. Hearing intact. Facial sensation intact. Face, tongue, palate moves normally and symmetrically.  Motor: Normal bulk and tone. Normal strength in all tested extremity muscles Sensory.: intact to touch , pinprick , position and vibratory sensation.  Coordination: Rapid alternating movements normal in all extremities. Finger-to-nose and heel-to-shin performed accurately bilaterally. Gait and Station: Arises from chair without difficulty. Stance is normal. Gait demonstrates normal stride length and balance without use of AD.  Reflexes: 1+ and symmetric. Toes downgoing.     NIHSS  0 Modified Rankin  2      ASSESSMENT: Nathaniel Waters is a 67 y.o. 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 Vascular risk factors include HTN, HLD, DM, tobacco use and advanced age.      PLAN:  Ischemic strokes:  Residual deficit: RUE numbness/tingling. Provided sensory reeducation material.  Continue working with OT.  Discussed typical recovery time.  Continue aspirin  81mg  daily and Plavix for additional 1 month then aspirin alone and continue rosuvastatin (Crestor) and Zetia for secondary stroke prevention.   Discussed secondary stroke prevention measures and importance  of close PCP follow up for aggressive stroke risk factor management including BP goal<130/90, HLD with LDL goal<70 and DM with A1c.<7 .  LDL 70 (09/17/2022), A1c 6.1 (08/05/2022), repeat A1c 8.2 (10/15/2022), PCP started Trulicity in addition to  [place] and glipizide but patient unable to afford Trulicity, advised to notify PCP for possible alternative options, plans on repeat lab work in Oct with PCP I have gone over the pathophysiology of stroke, warning signs and symptoms, risk factors and their management in some detail with instructions to go to the closest emergency room for symptoms of concern.  Hx of Tobacco use Congratulated on complete tobacco cessation and highly encouraged continuation Concerned regarding increased appetite since quitting, discussed increasing protein intake, ensuring adequate water intake and eating frequent smaller meals. This should improve as time goes on. If this continues to be an issue, advised to follow-up with PCP for further discussion   Doing well from stroke standpoint without further recommendations and risk factors are managed by PCP. He may follow up PRN, as usual for our patients who are strictly being followed for stroke. If any new neurological issues should arise, request PCP place referral for evaluation by one of our neurologists. Thank you.     CC:  [facility] provider: Dr. Pearlean Brownie PCP: Georgina Quint, MD    I spent 58 minutes of face-to-face and non-face-to-face time with patient.  This included previsit chart review including review of recent hospitalization, lab review, study review, order entry, electronic health record documentation, patient education regarding recent stroke including etiology, secondary stroke prevention measures and  importance of managing stroke risk factors, residual deficits and typical recovery time and answered all other questions to patient satisfaction   Ihor Austin, AGNP-BC  [facility] [address] [place], [place] [address]  Phone [phone] Fax [phone] Note: This document was prepared with digital dictation and possible smart phrase technology. Any transcriptional errors that result from this process are unintentional.

## 2022-11-17 ENCOUNTER — Encounter: Payer: Self-pay | Admitting: Adult Health

## 2022-11-17 ENCOUNTER — Ambulatory Visit: Payer: Medicare Other | Admitting: Occupational Therapy

## 2022-11-17 ENCOUNTER — Ambulatory Visit: Payer: Medicare Other | Admitting: Adult Health

## 2022-11-17 VITALS — BP 130/81 | HR 83 | Ht 71.0 in | Wt 219.0 lb

## 2022-11-17 DIAGNOSIS — R278 Other lack of coordination: Secondary | ICD-10-CM | POA: Diagnosis not present

## 2022-11-17 DIAGNOSIS — I639 Cerebral infarction, unspecified: Secondary | ICD-10-CM

## 2022-11-17 DIAGNOSIS — R29818 Other symptoms and signs involving the nervous system: Secondary | ICD-10-CM

## 2022-11-17 DIAGNOSIS — M6281 Muscle weakness (generalized): Secondary | ICD-10-CM

## 2022-11-17 DIAGNOSIS — R208 Other disturbances of skin sensation: Secondary | ICD-10-CM | POA: Diagnosis not present

## 2022-11-17 DIAGNOSIS — R2681 Unsteadiness on feet: Secondary | ICD-10-CM | POA: Diagnosis not present

## 2022-11-17 DIAGNOSIS — I69351 Hemiplegia and hemiparesis following cerebral infarction affecting right dominant side: Secondary | ICD-10-CM

## 2022-11-17 DIAGNOSIS — R2689 Other abnormalities of gait and mobility: Secondary | ICD-10-CM | POA: Diagnosis not present

## 2022-11-17 NOTE — Therapy (Unsigned)
OUTPATIENT OCCUPATIONAL THERAPY NEURO TREATMENT  Patient Name: Nathaniel Waters MRN: [id number] DOB:13-Jun-1955, 67 y.o., male Today's Date: 11/17/2022  PCP: Georgina Quint, MD  REFERRING PROVIDER: Lurene Shadow, MD  END OF SESSION:  OT End of Session - 11/17/22 1104     Visit Number 7    Number of Visits 9    Date for OT Re-Evaluation 11/28/22    Authorization Type UHC Medicare: Follows Medicare guidelines    OT Start Time 1103    OT Stop Time 1148    OT Time Calculation (min) 45 min    Equipment Utilized During [employer]    Activity Tolerance Patient tolerated treatment well    Behavior During Therapy WFL for tasks assessed/performed              Past Medical History:  Diagnosis Date   Diabetes mellitus    Diabetes mellitus without complication (HCC)    GERD (gastroesophageal reflux disease)    Hyperlipidemia    Hypertension    Pneumonia    Past Surgical History:  Procedure Laterality Date   CARPAL TUNNEL RELEASE     both    COLONOSCOPY  2018   TRANSURETHRAL RESECTION OF PROSTATE N/A 09/27/2021   Procedure: TRANSURETHRAL RESECTION OF THE PROSTATE (TURP), BIPOLAR;  Surgeon: Jannifer Hick, MD;  Location: [facility];  Service: Urology;  Laterality: N/A;   Patient Active Problem List   Diagnosis Date Noted   History of stroke 10/15/2022   Current smoker 11/18/2021   BPH (benign prostatic hyperplasia) 09/27/2021   BPH with obstruction/lower urinary tract symptoms 09/18/2021   Chronic hepatitis C without hepatic coma (HCC) 04/11/2019   Hypertension associated with diabetes (HCC) 04/09/2017   Cigarette nicotine dependence with nicotine-induced disorder 04/09/2017   Dyslipidemia associated with type 2 diabetes mellitus (HCC) 10/23/2014   Dyslipidemia 09/22/2014   Elevated PSA 09/22/2014   GERD 12/30/2007   Type II diabetes mellitus, uncontrolled 12/29/2007   TUBULOVILLOUS ADENOMA, COLON, HX OF 12/29/2007    ONSET DATE: referral: 09/18/2022  onset: 09/16/22  REFERRING DIAG:  Diagnosis  I63.9 (ICD-10-CM) - Cerebrovascular accident (CVA), unspecified mechanism (    THERAPY DIAG:  Other disturbances of skin sensation  Muscle weakness (generalized)  Hemiplegia and hemiparesis following cerebral infarction affecting right dominant side (HCC)  Other symptoms and signs involving the nervous system  Rationale for Evaluation and Treatment: Rehabilitation  SUBJECTIVE:   SUBJECTIVE STATEMENT:   Patient reports walking on the treadmill about 3.5 miles/day at the gym and doing some weights and feeling okay.  However the numbness in R UE remains about the same from the shoulder down to his hand.  Pt accompanied by: self   PERTINENT HISTORY:   PMHx: tobacco abuse, reflux, diabetes mellitus 2 (farxiga & glipizide ~ couple of years), dyslipidemia, hypertension, chronic hep C previously treated with medications, and BPH (no longer on medications).  He presented to the hospital 09/16/22 with right-sided weakness and numbness, headache, vision changes and balance issues.  He took a nap around 1 PM that day prior to admission.  When he woke up from his nap he noticed the numbness and weakness in his right upper extremity and falling to the R. Imaging revealed punctate acute infarction affecting the right posterior lateral medulla and inferior right cerebellum.  PRECAUTIONS: Fall  WEIGHT BEARING RESTRICTIONS: No  PAIN:  Are you having pain? No  FALLS: Has patient fallen in last 6 months? Yes. Number of falls 1 when he had his stroke ie)  fell against the wall and slid to the floor  LIVING ENVIRONMENT:  Lives with: lives alone Lives in: Apartment Stairs: Yes: External: approx 14 steps; can reach both Has following equipment at home: Dan Humphreys - 2 wheeled, shower chair, and [employer]  Had an accident several years ago and his son bought him several items but the shower chair is still in the box and the walker and crutches are in the  closet.  PLOF: Independent, Leisure: [employer] 3-4 miles/day x 3 days/week, and still drove 2017 [employer] car (brother brought him to appt today)  PATIENT GOALS: to use his right hand better and not to drop things  OBJECTIVE:   HAND DOMINANCE: Right  ADLs: Overall ADLs: Patient has returned home and is performing ADLs and home management on his own. Transfers/ambulation related to ADLs: MI with and without cane Eating: Some trouble cutting certain foods ie) trouble cutting a melon the other day Grooming: Shaving - he reports difficulty smoothly shaving ie) overshoots with R hand, put on too much shaving cream and wiped it on his lips etc UB Dressing: Patient reports MI LB Dressing: Hardest thing has been putting on his shoes but he's getting by Toileting: Independent Bathing: Patient has gotten in the shower to bath since getting home - just moves slower. Tub Shower transfers: Holds the wall to step into the tub and pulls the shower curtain Equipment:  Not using any equipment at this time  IADLs: Shopping: Brother and/or sons (2 locally) have been helping with shopping Light housekeeping: Taking his time with homemaking Meal Prep: Uses air fryer and microwave (will fry eggs). Reports inability to prepare his Sunday meal like he did before. Community mobility: Has [employer] for use in public Medication management: I - opens his pill bottles (does not use pill box) Financial management: I Handwriting: 75% legible, Increased time, and patient is unable to 'write', currently printing and not please with his clarity  MOBILITY STATUS: Independent and has cane for public mobiltiy  POSTURE COMMENTS:  No Significant postural limitations Sitting balance:  WFL  ACTIVITY TOLERANCE: Activity tolerance: Significantly decreased ie) PLOF walked 3 days a week x 4 miles and did weights but not able to do this now.  He also notes he is sleeping much more than usual ie) 8+ hours at night  recently compared to 5 hours previously and will still nod off watching TV etc.    FUNCTIONAL OUTCOME MEASURES: PSFS Score 5.7 on 10/06/22  11/03/22 - PSFS Score 8.0 on 11/03/22  UPPER EXTREMITY ROM:    Active ROM Right eval Left eval  Shoulder flexion slow WFL  Shoulder abduction slow   Shoulder adduction    Shoulder extension Decreased end range   Shoulder internal rotation Hurts to turn inwards to reach waistband   Shoulder external rotation    Elbow flexion    Elbow extension    Wrist flexion End range decreased   Wrist extension End range decreased   Wrist ulnar deviation    Wrist radial deviation    Wrist pronation    Wrist supination End range decreased   (Blank rows = not tested)  UPPER EXTREMITY MMT:     MMT Right eval Left eval  Shoulder flexion Grossly 4/5 Grossly 5/5  Shoulder abduction    Shoulder adduction    Shoulder extension    Shoulder internal rotation    Shoulder external rotation    Middle trapezius    Lower trapezius    Elbow flexion  Elbow extension    Wrist flexion    Wrist extension    Wrist ulnar deviation    Wrist radial deviation    Wrist pronation    Wrist supination    (Blank rows = not tested)  HAND FUNCTION: Grip strength: Right: 81.3 - 88.1 lbs; Left: 75.8 - 76.0  lbs 11/17/22 Right: 78.7 - 88.8 Left 71.4-73.8 lbs  COORDINATION: 9 Hole Peg test: Right: 28.88  sec; Left: 25.81 sec  SENSATION:  Patient reports he has difficulty with numbness in R arm/hand ie) couldn't feel the dynamometer very well, drops items he is holding and has trouble with specific identification of objects with stereognosis. Light touch: Impaired  Stereognosis: Impaired  Light touch ~ 5 mm with R index finger; Stereognosis - grossly intact but difficulty identifying specific items ie) block versus dice, penny versus nickel.  EDEMA: NA  MUSCLE TONE: WFL  COGNITION: Overall cognitive status: Within functional limits for tasks  assessed  VISION: Subjective report: Patient reports getting lightheaded but no real changes in his vision since his stroke.  He does have to wear his glasses all the time. Baseline vision:  Progressive lenses x 5 years s/p only using reading glasses previously Visual history:  NA  VISION ASSESSMENT: Tracking/Visual pursuits: Decreased smoothness with horizontal tracking and Decreased smoothness with vertical tracking  Patient has difficulty with following activities due to following visual impairments: NA  PERCEPTION: WFL  PRAXIS: WFL  OBSERVATIONS at EVAL: Pt ambulates with and without use of Hurri Cane as noted by patient carrying the can rather than using it during evaluation with only minor balance deficits. The pt appears well kept including a well shaved goatee, and appropriate attire.  He has difficulty with R UE awareness and motor coordination as noted by dropping some pegs and misjudging placement of pegs a couple of times. He also reported decreased awareness of items in his hand including dropping items at home.   TODAY'S TREATMENT:                                                                                                                               Neuromuscular Re-education:  Therapeutic procedure is provided to improve balance, coordination, kinesthetic sense, posture, and proprioception  PATIENT EDUCATION: Education details: Sensitization activities; coordination activities Person educated: Patient Education method: Programmer, multimedia, Demonstration, and Verbal cues Education comprehension: verbalized understanding, returned demonstration, and needs further education  HOME EXERCISE PROGRAM: Eval - 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 Yes  SHORT TERM GOALS: Target date: 10/28/22  Patient will demonstrate UE ROM/coordination and sensory HEP with 25% verbal cues or less for proper execution.  Baseline: New CVA Goal status: MET   2.  Patient will demonstrate improved Stereognosis to identify different items in his pocket and different coins with vision occluded.  Baseline: Grossly intact but difficulty identifying specific items ie) block versus dice, penny verus nickel. Goal status: In PROGRESS  3.  Patient will improve pinch strength/coordination & sensation on R hand to increase ease & independence in tying shoelaces, and manage buttons with vision occluded Baseline: Self reported difficulty. Goal status: MET  4.  Patient will complete nine-hole peg with use of RUE in 25 seconds or less without overshooting holes.  Baseline: 28.88 seconds with decreased accuracy Goal status: MET 11/03/22: 25.80 seconds   5. Patient will demonstrate increased ease with RUE internal rotation for LB dressing without pain. Baseline: Pain with motion. Goal Status: MET  LONG TERM GOALS: Target date: 11/28/22  Patient will report no dropping of objects from R UE x 1 week. Baseline: Patient has to use 2 hands to hold items after dropping dinner plate with RUE. Goal status: IN PROGRESS  2.  Patient will report increased ease with 10 repetitions of multiple UE exercises per PLOF at the gym. Baseline: Not comfortable exercising in case he drops items Goal status: MET  3.  Improve touch sensation & calibration of grasp on R hand so patient can effectively grasp a paper/styrofoam cup w/o spilling and/or maintain hold of objects throughout his home w/o slipping out of his hand. Baseline: Performing tasks bilaterally at eval for safety. Goal status: MET  4.  Patient will report at least two-point increase in average PSFS score or at least three-point increase in a single activity score indicating functionally significant improvement  given minimum detectable change. Baseline: 5.7 total score (See above for individual activity scores)  Goal status: IN PROGRESS  ASSESSMENT:  CLINICAL IMPRESSION:  Patient seen today for OT due to ongoing sensory deficits on RUE s/p recent CVA. He is given further education re: BUE coordination (ie. Using both sides together), sensory stimulation and motor control through ongoing sensory activities with various textrues and practicing functional activities with his affected side.  He continues to benefit from skilled OT services in the outpatient setting to work on impairments as noted below to help pt return to PLOF as able.      PERFORMANCE DEFICITS: in functional skills including ADLs, IADLs, coordination, dexterity, proprioception, sensation, Fine motor control, Gross motor control, endurance, decreased knowledge of use of DME, and UE functional use.  IMPAIRMENTS: are limiting patient from ADLs, IADLs, and leisure.   CO-MORBIDITIES: may have co-morbidities  that affects occupational performance. Patient will benefit from skilled OT to address above impairments and improve overall function.   REHAB POTENTIAL: Excellent   PLAN:  OT FREQUENCY: 1x/week  OT DURATION: 8 weeks  PLANNED INTERVENTIONS: self care/ADL training, therapeutic exercise, therapeutic activity, neuromuscular re-education, balance training, and DME and/or AE instructions  RECOMMENDED OTHER SERVICES: Patient received PT evaluation prior to OT visit today and will begin PT weekly treatment (1x/week x 6 weeks).  CONSULTED AND AGREED WITH PLAN OF CARE: Patient  PLAN FOR NEXT SESSION:   Consider Discrhe at this visit  Rice bin and ice/heat home sensory program.  Stereognosis assessment - Progress HEPs for sensory stimulation.  Weight bearing for increased R UE awareness. Circuit training to return to the gym.   Picking up similar objects of different weights - Cutting food, picking up pot/pans, getting objects  out of the fridge. Graded pouring and use of knife.  Victorino Sparrow, OT 11/17/2022, 12:13 PM

## 2022-11-17 NOTE — Patient Instructions (Addendum)
Continue working with OT, can review [employer] for sensory re-education   Continue complete tobacco cessation as this will greatly reduce chance of additional strokes in the future. Can try to increase protein intake and water intake to hep curb appetite, eating frequent small meals throughout the day can also help curb your appetite   Continue aspirin 81 mg daily and clopidogrel 75 mg daily (Plavix) for additional 1 month then aspirin alone and continue Crestor 40 mg daily and Zetia for secondary stroke prevention, medications to be managed by your PCP  Continue to follow up with PCP regarding blood pressure, cholesterol and diabetes management  Maintain strict control of hypertension with blood pressure goal below 130/90, diabetes with hemoglobin A1c goal below 7.0 % and cholesterol with LDL cholesterol (bad cholesterol) goal below 70 mg/dL.   Signs of a Stroke? Follow the BEFAST method:  Balance Watch for a sudden loss of balance, trouble with coordination or vertigo Eyes Is there a sudden loss of vision in one or both eyes? Or double vision?  Face: Ask the person to smile. Does one side of the face droop or is it numb?  Arms: Ask the person to raise both arms. Does one arm drift downward? Is there weakness or numbness of a leg? Speech: Ask the person to repeat a simple phrase. Does the speech sound slurred/strange? Is the person confused ? Time: If you observe any of these signs, call 911.        Thank you for coming to see [place] at [facility]. I hope we have been able to provide you high quality care today.  You may receive a patient satisfaction survey over the next few weeks. We would appreciate your feedback and comments so that we may continue to improve ourselves and the health of our patients.     Stroke Prevention Some medical conditions and lifestyle choices can lead to a higher risk for a stroke. You can help to prevent a stroke by eating healthy foods  and exercising. It also helps to not smoke and to manage any health problems you may have. How can this condition affect me? A stroke is an emergency. It should be treated right away. A stroke can lead to brain damage or threaten your life. There is a better chance of surviving and getting better after a stroke if you get medical help right away. What can increase my risk? The following medical conditions may increase your risk of a stroke: Diseases of the heart and blood vessels (cardiovascular disease). High blood pressure (hypertension). Diabetes. High cholesterol. Sickle cell disease. Problems with blood clotting. Being very overweight. Sleeping problems (obstructivesleep apnea). Other risk factors include: Being older than age 60. A history of blood clots, stroke, or mini-stroke (TIA). Race, ethnic background, or a family history of stroke. Smoking or using tobacco products. Taking birth control pills, especially if you smoke. Heavy alcohol and drug use. Not being active. What actions can I take to prevent this? Manage your health conditions High cholesterol. Eat a healthy diet. If this is not enough to manage your cholesterol, you may need to take medicines. Take medicines as told by your doctor. High blood pressure. Try to keep your blood pressure below 130/80. If your blood pressure cannot be managed through a healthy diet and regular exercise, you may need to take medicines. Take medicines as told by your doctor. Ask your doctor if you should check your blood pressure at home. Have your blood pressure checked  every year. Diabetes. Eat a healthy diet and get regular exercise. If your blood sugar (glucose) cannot be managed through diet and exercise, you may need to take medicines. Take medicines as told by your doctor. Talk to your doctor about getting checked for sleeping problems. Signs of a problem can include: Snoring a lot. Feeling very tired. Make sure that you  manage any other conditions you have. Nutrition  Follow instructions from your doctor about what to eat or drink. You may be told to: Eat and drink fewer calories each day. Limit how much salt (sodium) you use to 1,500 milligrams (mg) each day. Use only healthy fats for cooking, such as olive oil, canola oil, and sunflower oil. Eat healthy foods. To do this: Choose foods that are high in fiber. These include whole grains, and fresh fruits and vegetables. Eat at least 5 servings of fruits and vegetables a day. Try to fill one-half of your plate with fruits and vegetables at each meal. Choose low-fat (lean) proteins. These include low-fat cuts of meat, chicken without skin, fish, tofu, beans, and nuts. Eat low-fat dairy products. Avoid foods that: Are high in salt. Have saturated fat. Have trans fat. Have cholesterol. Are processed or pre-made. Count how many carbohydrates you eat and drink each day. Lifestyle If you drink alcohol: Limit how much you have to: 0-1 drink a day for women who are not pregnant. 0-2 drinks a day for men. Know how much alcohol is in your drink. In the [place] one drink equals one 12 oz bottle of beer ( ), one 5 oz glass of wine ( ), or one 1 oz glass of hard liquor (44mL). Do not smoke or use any products that have nicotine or tobacco. If you need help quitting, ask your doctor. Avoid secondhand smoke. Do not use drugs. Activity  Try to stay at a healthy weight. Get at least 30 minutes of exercise on most days, such as: Fast walking. Biking. Swimming. Medicines Take over-the-counter and prescription medicines only as told by your doctor. Avoid taking birth control pills. Talk to your doctor about the risks of taking birth control pills if: You are over 29 years old. You smoke. You get very bad headaches. You have had a blood clot. Where to find more information American Stroke Association: www.strokeassociation.org Get help right away  if: You or a loved one has any signs of a stroke. "BE FAST" is an easy way to remember the warning signs: B - Balance. Dizziness, sudden trouble walking, or loss of balance. E - Eyes. Trouble seeing or a change in how you see. F - Face. Sudden weakness or loss of feeling of the face. The face or eyelid may droop on one side. A - Arms. Weakness or loss of feeling in an arm. This happens all of a sudden and most often on one side of the body. S - Speech. Sudden trouble speaking, slurred speech, or trouble understanding what people say. T - Time. Time to call emergency services. Write down what time symptoms started. You or a loved one has other signs of a stroke, such as: A sudden, very bad headache with no known cause. Feeling like you may vomit (nausea). Vomiting. A seizure. These symptoms may be an emergency. Get help right away. Call your local emergency services (911 in the [place] Do not wait to see if the symptoms will go away. Do not drive yourself to the hospital. Summary You can help to prevent a stroke by eating healthy, exercising,  and not smoking. It also helps to manage any health problems you have. Do not smoke or use any products that contain nicotine or tobacco. Get help right away if you or a loved one has any signs of a stroke. This information is not intended to replace advice given to you by your health care provider. Make sure you discuss any questions you have with your health care provider. Document Revised: 02/24/2022 Document Reviewed: 02/24/2022 Elsevier Patient Education  2024 [employer].

## 2022-11-18 NOTE — Progress Notes (Signed)
I agree with the above plan 

## 2022-11-24 ENCOUNTER — Ambulatory Visit: Payer: Medicare Other | Admitting: Occupational Therapy

## 2022-11-24 DIAGNOSIS — R2681 Unsteadiness on feet: Secondary | ICD-10-CM | POA: Diagnosis not present

## 2022-11-24 DIAGNOSIS — R278 Other lack of coordination: Secondary | ICD-10-CM | POA: Diagnosis not present

## 2022-11-24 DIAGNOSIS — R29818 Other symptoms and signs involving the nervous system: Secondary | ICD-10-CM | POA: Diagnosis not present

## 2022-11-24 DIAGNOSIS — M6281 Muscle weakness (generalized): Secondary | ICD-10-CM | POA: Diagnosis not present

## 2022-11-24 DIAGNOSIS — R208 Other disturbances of skin sensation: Secondary | ICD-10-CM

## 2022-11-24 DIAGNOSIS — R2689 Other abnormalities of gait and mobility: Secondary | ICD-10-CM | POA: Diagnosis not present

## 2022-11-24 DIAGNOSIS — I69351 Hemiplegia and hemiparesis following cerebral infarction affecting right dominant side: Secondary | ICD-10-CM | POA: Diagnosis not present

## 2022-11-24 NOTE — Therapy (Signed)
OUTPATIENT OCCUPATIONAL THERAPY NEURO TREATMENT & DISCHARGE SUMMARY  Patient Name: Nathaniel Waters MRN: [id number] DOB:Apr 15, 1955, 67 y.o., male Today's Date: 11/24/2022  PCP: Georgina Quint, MD  REFERRING PROVIDER: Lurene Shadow, MD  END OF SESSION:  OT End of Session - 11/24/22 1020     Visit Number 8    Number of Visits 9    Date for OT Re-Evaluation 11/28/22    Authorization Type U[employer] Medicare: Follows Medicare guidelines    OT Start Time 1020    OT Stop Time 1050    OT Time Calculation (min) 30 min    Equipment Utilized During Treatment FM materials, Rice Bin    Activity Tolerance Patient tolerated treatment well    Behavior During Therapy WFL for tasks assessed/performed              Past Medical History:  Diagnosis Date   Diabetes mellitus    Diabetes mellitus without complication (HCC)    GERD (gastroesophageal reflux disease)    Hyperlipidemia    Hypertension    Pneumonia    Past Surgical History:  Procedure Laterality Date   CARPAL TUNNEL RELEASE     both    COLONOSCOPY  2018   TRANSURETHRAL RESECTION OF PROSTATE N/A 09/27/2021   Procedure: TRANSURETHRAL RESECTION OF THE PROSTATE (TURP), BIPOLAR;  Surgeon: Jannifer Hick, MD;  Location: [facility];  Service: Urology;  Laterality: N/A;   Patient Active Problem List   Diagnosis Date Noted   History of stroke 10/15/2022   Current smoker 11/18/2021   BPH (benign prostatic hyperplasia) 09/27/2021   BPH with obstruction/lower urinary tract symptoms 09/18/2021   Chronic hepatitis C without hepatic coma (HCC) 04/11/2019   Hypertension associated with diabetes (HCC) 04/09/2017   Cigarette nicotine dependence with nicotine-induced disorder 04/09/2017   Dyslipidemia associated with type 2 diabetes mellitus (HCC) 10/23/2014   Dyslipidemia 09/22/2014   Elevated PSA 09/22/2014   GERD 12/30/2007   Type II diabetes mellitus, uncontrolled 12/29/2007   TUBULOVILLOUS ADENOMA, COLON, HX OF 12/29/2007    ONSET  DATE: referral: 09/18/2022 onset: 09/16/22  REFERRING DIAG:  Diagnosis  I63.9 (ICD-10-CM) - Cerebrovascular accident (CVA), unspecified mechanism (    THERAPY DIAG:  Other disturbances of skin sensation  Other lack of coordination  Rationale for Evaluation and Treatment: Rehabilitation  SUBJECTIVE:   SUBJECTIVE STATEMENT:   Patient reports the numbness in R UE remains about the same from the shoulder down to his hand.  He is ready for discharge today and satisfied with HEP ideas although he has ongoing numbness.  Pt accompanied by: self   PERTINENT HISTORY:   PMHx: tobacco abuse, reflux, diabetes mellitus 2 (farxiga & glipizide ~ couple of years), dyslipidemia, hypertension, chronic hep C previously treated with medications, and BPH (no longer on medications).  He presented to the hospital 09/16/22 with right-sided weakness and numbness, headache, vision changes and balance issues.  He took a nap around 1 PM that day prior to admission.  When he woke up from his nap he noticed the numbness and weakness in his right upper extremity and falling to the R. Imaging revealed punctate acute infarction affecting the right posterior lateral medulla and inferior right cerebellum.  PRECAUTIONS: Fall  WEIGHT BEARING RESTRICTIONS: No  PAIN:  Are you having pain? No  FALLS: Has patient fallen in last 6 months? Yes. Number of falls 1 when he had his stroke ie) fell against the wall and slid to the floor  LIVING ENVIRONMENT:  Lives with: lives alone  Lives in: Apartment Stairs: Yes: External: approx 14 steps; can reach both Has following equipment at home: Dan Humphreys - 2 wheeled, shower chair, and [employer]  Had an accident several years ago and his son bought him several items but the shower chair is still in the box and the walker and crutches are in the closet.  PLOF: Independent, Leisure: [employer] 3-4 miles/day x 3 days/week, and still drove 2017 [employer] car (brother brought him to  appt today)  PATIENT GOALS: to use his right hand better and not to drop things  OBJECTIVE:   HAND DOMINANCE: Right  ADLs: Overall ADLs: Patient has returned home and is performing ADLs and home management on his own. Transfers/ambulation related to ADLs: MI with and without cane Eating: Some trouble cutting certain foods ie) trouble cutting a melon the other day Grooming: Shaving - he reports difficulty smoothly shaving ie) overshoots with R hand, put on too much shaving cream and wiped it on his lips etc UB Dressing: Patient reports MI LB Dressing: Hardest thing has been putting on his shoes but he's getting by Toileting: Independent Bathing: Patient has gotten in the shower to bath since getting home - just moves slower. Tub Shower transfers: Holds the wall to step into the tub and pulls the shower curtain Equipment:  Not using any equipment at this time  IADLs: Shopping: Brother and/or sons (2 locally) have been helping with shopping Light housekeeping: Taking his time with homemaking Meal Prep: Uses air fryer and microwave (will fry eggs). Reports inability to prepare his Sunday meal like he did before. Community mobility: Has [employer] for use in public Medication management: I - opens his pill bottles (does not use pill box) Financial management: I Handwriting: 75% legible, Increased time, and patient is unable to 'write', currently printing and not please with his clarity  MOBILITY STATUS: Independent and has cane for public mobiltiy  POSTURE COMMENTS:  No Significant postural limitations Sitting balance:  WFL  ACTIVITY TOLERANCE: Activity tolerance: Significantly decreased ie) PLOF walked 3 days a week x 4 miles and did weights but not able to do this now.  He also notes he is sleeping much more than usual ie) 8+ hours at night recently compared to 5 hours previously and will still nod off watching TV etc.    FUNCTIONAL OUTCOME MEASURES: PSFS Score 5.7 on  10/06/22  11/03/22 - PSFS Score 8.0  UPPER EXTREMITY ROM:    Active ROM Right eval Left eval  Shoulder flexion slow WFL  Shoulder abduction slow   Shoulder adduction    Shoulder extension Decreased end range   Shoulder internal rotation Hurts to turn inwards to reach waistband   Shoulder external rotation    Elbow flexion    Elbow extension    Wrist flexion End range decreased   Wrist extension End range decreased   Wrist ulnar deviation    Wrist radial deviation    Wrist pronation    Wrist supination End range decreased   (Blank rows = not tested)  UPPER EXTREMITY MMT:     MMT Right eval Left eval  Shoulder flexion Grossly 4/5 Grossly 5/5  Shoulder abduction    Shoulder adduction    Shoulder extension    Shoulder internal rotation    Shoulder external rotation    Middle trapezius    Lower trapezius    Elbow flexion    Elbow extension    Wrist flexion    Wrist extension    Wrist  ulnar deviation    Wrist radial deviation    Wrist pronation    Wrist supination    (Blank rows = not tested)  HAND FUNCTION: Grip strength: Right: 81.3 - 88.1 lbs; Left: 75.8 - 76.0  lbs 11/17/22 Right: 78.7 - 88.8 Left 71.4-73.8 lbs  COORDINATION: 9 Hole Peg test: Right: 28.88  sec; Left: 25.81 sec  SENSATION:  Patient reports he has difficulty with numbness in R arm/hand ie) couldn't feel the dynamometer very well, drops items he is holding and has trouble with specific identification of objects with stereognosis. Light touch: Impaired  Stereognosis: Impaired  Light touch ~ 5 mm with R index finger; Stereognosis - grossly intact but difficulty identifying specific items ie) block versus dice, penny versus nickel.  EDEMA: NA  MUSCLE TONE: WFL  COGNITION: Overall cognitive status: Within functional limits for tasks assessed  VISION: Subjective report: Patient reports getting lightheaded but no real changes in his vision since his stroke.  He does have to wear his glasses all  the time. Baseline vision:  Progressive lenses x 5 years s/p only using reading glasses previously Visual history:  NA  VISION ASSESSMENT: Tracking/Visual pursuits: Decreased smoothness with horizontal tracking and Decreased smoothness with vertical tracking  Patient has difficulty with following activities due to following visual impairments: NA  PERCEPTION: WFL  PRAXIS: WFL  OBSERVATIONS at EVAL: Pt ambulates with and without use of Hurri Cane as noted by patient carrying the can rather than using it during evaluation with only minor balance deficits. The pt appears well kept including a well shaved goatee, and appropriate attire.  He has difficulty with R UE awareness and motor coordination as noted by dropping some pegs and misjudging placement of pegs a couple of times. He also reported decreased awareness of items in his hand including dropping items at home.  TODAY'S TREATMENT:                                                                                                                               Therapeutic Activities:  Demonstrated Rice bin activity to help with stimulation of his hands and continued resensitization.  He is encouraged to use R hand to find objects hidden in the rice and encouraged to try and identify objects or describe them with vision occluded with fairly good success at identifying key features of items such as block, peg, checker, screw, etc. Although he had difficulty identifying objects without seeing them at first, he could match them appropriately.  He continues to be encouraged to use visualization, verbal feedback/positive talk to help with awareness of different features of objects ie) round, square, spiky, rubbery, cool etc.   Stereognosis activities also demonstrated to progress HEPs for sensory stimulation and awareness of similar objects.  He did well with 4 different block objects and disc/penny sized objects > 80% success.   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 Yes  SHORT TERM GOALS: Target date: 10/28/22  Patient will demonstrate UE ROM/coordination and sensory HEP with 25% verbal cues or less for proper execution.  Baseline: New CVA Goal status: MET   2.  Patient will demonstrate improved Stereognosis to identify different items in his pocket and different coins with vision occluded.  Baseline: Grossly intact but difficulty identifying specific items ie) block versus dice, penny verus nickel. Goal status: MET  3.  Patient will improve pinch strength/coordination & sensation on R hand to increase ease & independence in tying shoelaces, and manage buttons with vision occluded Baseline: Self reported difficulty. Goal status: MET  4.  Patient will complete nine-hole peg with use of RUE in 25 seconds or less without overshooting holes.  Baseline: 28.88 seconds with decreased accuracy Goal status: MET 11/03/22: 25.80 seconds   5. Patient will demonstrate increased ease with RUE internal rotation for LB dressing without pain. Baseline: Pain with motion. Goal Status: MET  LONG TERM GOALS: Target date: 11/28/22  Patient will report no dropping of objects from R UE x 1 week. Baseline: Patient has to use 2 hands to hold items after dropping dinner plate with RUE. Goal status: MET  2.  Patient will report increased ease with 10 repetitions of multiple UE exercises per PLOF at the gym. Baseline: Not comfortable exercising in case he drops items Goal status:  MET  3.  Improve touch sensation & calibration of grasp on R hand so patient can effectively grasp a paper/styrofoam cup w/o spilling and/or maintain hold of objects throughout his home w/o slipping out of his hand. Baseline: Performing tasks bilaterally at eval for safety. Goal status: MET  4.  Patient will report at least two-point increase in average PSFS score or at least three-point increase in a single activity score indicating functionally significant improvement given minimum detectable change. Baseline: 5.7 total score (See above for individual activity scores)  Goal status: MET 11/03/22 - PSFS Score 8.0  ASSESSMENT:  CLINICAL IMPRESSION:  Patient seen today for OT discharge  today s/p 8 visits due to sensory deficits of RUE s/p CVA. He is given further education re: BUE use for sensory feedback/awareness with sensory stimulation, weight bearing and managing objects of different sizes.  He is ready for DC from skilled OT services at this time.  PERFORMANCE DEFICITS: in functional skills including ADLs, IADLs, coordination, dexterity, proprioception, sensation, Fine motor control, Gross motor control, endurance, decreased knowledge of use of DME, and UE functional use.  IMPAIRMENTS: are limiting patient from ADLs, IADLs, and leisure.   CO-MORBIDITIES: may have co-morbidities  that affects occupational performance. Patient will benefit from skilled OT to address above impairments and improve overall function.  REHAB POTENTIAL: Excellent   PLAN:  OCCUPATIONAL THERAPY DISCHARGE SUMMARY  Visits from Start of Care: 8 including evaluation  Current functional level related to goals / functional outcomes: Pt has met all goals (5/5 short term and 4/4 long term goals) to satisfactory levels and is pleased with outcomes.  Remaining deficits: Pt has ongoing numbness in R UE but with minimal impact on function at this time and no pain.  Education /  Equipment: Pt has all needed  materials and education. Pt understands how to continue on with self-management. See tx notes for more details.   Patient agrees to discharge due to max benefits received from outpatient occupational therapy at this time.   Victorino Sparrow, OT 11/24/2022, 4:54 PM

## 2022-12-03 ENCOUNTER — Other Ambulatory Visit: Payer: Self-pay | Admitting: Emergency Medicine

## 2022-12-03 DIAGNOSIS — Z8673 Personal history of transient ischemic attack (TIA), and cerebral infarction without residual deficits: Secondary | ICD-10-CM

## 2023-01-15 ENCOUNTER — Telehealth: Payer: Self-pay | Admitting: Emergency Medicine

## 2023-01-15 NOTE — Telephone Encounter (Signed)
Yes it is okay to have dental procedure at this time.  Thanks.

## 2023-01-15 NOTE — Telephone Encounter (Signed)
Called patient and informed him of provider response

## 2023-01-15 NOTE — Telephone Encounter (Signed)
Patient called and is going to have a dental procedure - he wants to know if he is able to have any procedures since he had a stroke in June.  Please call patient and let him know.  Phone:  [phone]

## 2023-03-06 ENCOUNTER — Other Ambulatory Visit: Payer: Self-pay

## 2023-04-21 ENCOUNTER — Other Ambulatory Visit: Payer: Self-pay | Admitting: Emergency Medicine

## 2023-04-21 DIAGNOSIS — Z8673 Personal history of transient ischemic attack (TIA), and cerebral infarction without residual deficits: Secondary | ICD-10-CM

## 2023-04-21 DIAGNOSIS — I152 Hypertension secondary to endocrine disorders: Secondary | ICD-10-CM

## 2023-05-07 ENCOUNTER — Telehealth: Payer: Self-pay

## 2023-05-07 NOTE — Progress Notes (Signed)
Care Guide Pharmacy Note  05/07/2023 Name: [name] MRN: [id number] DOB: 1955-08-07  Referred By: Georgina Quint, MD Reason for referral: Care Coordination ([facility] Diabetes. )   Nathaniel Waters is a 68 y.o. year old male who is a primary care patient of Georgina Quint, MD.  Tillman Kazmierski was referred to the pharmacist for assistance related to: DMII  Successful contact was made with the patient to discuss pharmacy services including being ready for the pharmacist to call at least 5 minutes before the scheduled appointment time and to have medication bottles and any blood pressure readings ready for review. The patient agreed to meet with the pharmacist via telephone visit on (date/time). 05/27/23 at 10:00 a.m.   [facility]  [facility], [facility] Health Care Management Assistant Direct Dial: [phone]  Fax: [phone]

## 2023-05-11 ENCOUNTER — Emergency Department (HOSPITAL_COMMUNITY): Payer: Medicare Other

## 2023-05-11 ENCOUNTER — Encounter (HOSPITAL_COMMUNITY): Payer: Self-pay

## 2023-05-11 ENCOUNTER — Emergency Department (HOSPITAL_COMMUNITY)
Admission: EM | Admit: 2023-05-11 | Discharge: 2023-05-11 | Disposition: A | Discharge status: Home / Self Care | Payer: Medicare Other | Attending: Emergency Medicine | Admitting: Emergency Medicine

## 2023-05-11 ENCOUNTER — Other Ambulatory Visit: Payer: Self-pay

## 2023-05-11 DIAGNOSIS — I69351 Hemiplegia and hemiparesis following cerebral infarction affecting right dominant side: Secondary | ICD-10-CM | POA: Diagnosis not present

## 2023-05-11 DIAGNOSIS — M79601 Pain in right arm: Secondary | ICD-10-CM | POA: Diagnosis not present

## 2023-05-11 DIAGNOSIS — E785 Hyperlipidemia, unspecified: Secondary | ICD-10-CM | POA: Insufficient documentation

## 2023-05-11 DIAGNOSIS — R944 Abnormal results of kidney function studies: Secondary | ICD-10-CM | POA: Insufficient documentation

## 2023-05-11 DIAGNOSIS — R29898 Other symptoms and signs involving the musculoskeletal system: Secondary | ICD-10-CM

## 2023-05-11 DIAGNOSIS — Z7902 Long term (current) use of antithrombotics/antiplatelets: Secondary | ICD-10-CM | POA: Insufficient documentation

## 2023-05-11 DIAGNOSIS — K219 Gastro-esophageal reflux disease without esophagitis: Secondary | ICD-10-CM | POA: Diagnosis not present

## 2023-05-11 DIAGNOSIS — E119 Type 2 diabetes mellitus without complications: Secondary | ICD-10-CM | POA: Insufficient documentation

## 2023-05-11 DIAGNOSIS — R531 Weakness: Secondary | ICD-10-CM | POA: Diagnosis not present

## 2023-05-11 DIAGNOSIS — D72829 Elevated white blood cell count, unspecified: Secondary | ICD-10-CM | POA: Diagnosis not present

## 2023-05-11 DIAGNOSIS — I10 Essential (primary) hypertension: Secondary | ICD-10-CM | POA: Diagnosis not present

## 2023-05-11 DIAGNOSIS — G459 Transient cerebral ischemic attack, unspecified: Secondary | ICD-10-CM | POA: Insufficient documentation

## 2023-05-11 DIAGNOSIS — M67813 Other specified disorders of tendon, right shoulder: Secondary | ICD-10-CM | POA: Diagnosis not present

## 2023-05-11 DIAGNOSIS — I6782 Cerebral ischemia: Secondary | ICD-10-CM | POA: Diagnosis not present

## 2023-05-11 DIAGNOSIS — R200 Anesthesia of skin: Secondary | ICD-10-CM | POA: Diagnosis not present

## 2023-05-11 DIAGNOSIS — M25511 Pain in right shoulder: Secondary | ICD-10-CM | POA: Diagnosis not present

## 2023-05-11 DIAGNOSIS — Z79899 Other long term (current) drug therapy: Secondary | ICD-10-CM | POA: Insufficient documentation

## 2023-05-11 DIAGNOSIS — M19011 Primary osteoarthritis, right shoulder: Secondary | ICD-10-CM | POA: Diagnosis not present

## 2023-05-11 DIAGNOSIS — Z7984 Long term (current) use of oral hypoglycemic drugs: Secondary | ICD-10-CM | POA: Diagnosis not present

## 2023-05-11 DIAGNOSIS — Z7982 Long term (current) use of aspirin: Secondary | ICD-10-CM | POA: Insufficient documentation

## 2023-05-11 LAB — I-STAT CHEM 8, ED
BUN: 22 mg/dL (ref 8–23)
Calcium, Ion: 1.07 mmol/L — ABNORMAL LOW (ref 1.15–1.40)
Chloride: 105 mmol/L (ref 98–111)
Creatinine, Ser: 1.4 mg/dL — ABNORMAL HIGH (ref 0.61–1.24)
Glucose, Bld: 180 mg/dL — ABNORMAL HIGH (ref 70–99)
HCT: 45 % (ref 39.0–52.0)
Hemoglobin: 15.3 g/dL (ref 13.0–17.0)
Potassium: 4 mmol/L (ref 3.5–5.1)
Sodium: 135 mmol/L (ref 135–145)
TCO2: 20 mmol/L — ABNORMAL LOW (ref 22–32)

## 2023-05-11 LAB — CBC
HCT: 45.5 % (ref 39.0–52.0)
Hemoglobin: 15.1 g/dL (ref 13.0–17.0)
MCH: 30.2 pg (ref 26.0–34.0)
MCHC: 33.2 g/dL (ref 30.0–36.0)
MCV: 91 fL (ref 80.0–100.0)
Platelets: 195 10*3/uL (ref 150–400)
RBC: 5 MIL/uL (ref 4.22–5.81)
RDW: 13.5 % (ref 11.5–15.5)
WBC: 11.5 10*3/uL — ABNORMAL HIGH (ref 4.0–10.5)
nRBC: 0 % (ref 0.0–0.2)

## 2023-05-11 LAB — DIFFERENTIAL
Abs Immature Granulocytes: 0.04 10*3/uL (ref 0.00–0.07)
Basophils Absolute: 0 10*3/uL (ref 0.0–0.1)
Basophils Relative: 0 %
Eosinophils Absolute: 0 10*3/uL (ref 0.0–0.5)
Eosinophils Relative: 0 %
Immature Granulocytes: 0 %
Lymphocytes Relative: 25 %
Lymphs Abs: 2.9 10*3/uL (ref 0.7–4.0)
Monocytes Absolute: 1 10*3/uL (ref 0.1–1.0)
Monocytes Relative: 8 %
Neutro Abs: 7.6 10*3/uL (ref 1.7–7.7)
Neutrophils Relative %: 67 %

## 2023-05-11 LAB — COMPREHENSIVE METABOLIC PANEL
ALT: 24 U/L (ref 0–44)
AST: 26 U/L (ref 15–41)
Albumin: 3.9 g/dL (ref 3.5–5.0)
Alkaline Phosphatase: 51 U/L (ref 38–126)
Anion gap: 11 (ref 5–15)
BUN: 20 mg/dL (ref 8–23)
CO2: 19 mmol/L — ABNORMAL LOW (ref 22–32)
Calcium: 9.2 mg/dL (ref 8.9–10.3)
Chloride: 104 mmol/L (ref 98–111)
Creatinine, Ser: 1.42 mg/dL — ABNORMAL HIGH (ref 0.61–1.24)
GFR, Estimated: 54 mL/min — ABNORMAL LOW (ref >60)
Glucose, Bld: 176 mg/dL — ABNORMAL HIGH (ref 70–99)
Potassium: 3.9 mmol/L (ref 3.5–5.1)
Sodium: 134 mmol/L — ABNORMAL LOW (ref 135–145)
Total Bilirubin: 0.9 mg/dL (ref 0.0–1.2)
Total Protein: 7.2 g/dL (ref 6.5–8.1)

## 2023-05-11 LAB — PROTIME-INR
INR: 1 (ref 0.8–1.2)
Prothrombin Time: 13.4 s (ref 11.4–15.2)

## 2023-05-11 LAB — ETHANOL: Alcohol, Ethyl (B): <10 mg/dL (ref <10)

## 2023-05-11 LAB — APTT: aPTT: 28 s (ref 24–36)

## 2023-05-11 LAB — CBG MONITORING, ED: Glucose-Capillary: 188 mg/dL — ABNORMAL HIGH (ref 70–99)

## 2023-05-11 MED ORDER — FENTANYL CITRATE PF 50 MCG/ML IJ SOSY
25.0000 ug | PREFILLED_SYRINGE | Freq: Once | INTRAMUSCULAR | Status: AC
Start: 2023-05-11 — End: 2023-05-11
  Administered 2023-05-11: 25 ug via INTRAVENOUS
  Filled 2023-05-11: qty 1

## 2023-05-11 MED ORDER — SODIUM CHLORIDE 0.9% FLUSH: 3.0000 mL | Freq: Once | INTRAVENOUS | Status: DC

## 2023-05-11 MED ORDER — OXYCODONE HCL 5 MG PO TABS: 5.0000 mg | ORAL_TABLET | ORAL | 0 refills | Status: DC | PRN

## 2023-05-11 MED ORDER — FENTANYL CITRATE PF 50 MCG/ML IJ SOSY
50.0000 ug | PREFILLED_SYRINGE | Freq: Once | INTRAMUSCULAR | Status: AC
Start: 2023-05-11 — End: 2023-05-11
  Administered 2023-05-11: 50 ug via INTRAVENOUS
  Filled 2023-05-11: qty 1

## 2023-05-11 NOTE — ED Triage Notes (Signed)
Pt comming in from home with pain in his right arm pt reports tingling and numbness in the right arm. Pt reports noticing 0400 when he got up to use the bathroom. Pt reports feeling normal at 10pm on 2/2.

## 2023-05-11 NOTE — Discharge Instructions (Signed)
Your workup was reassuring.  Was provided.  Pain medicine given.  Follow-up with your orthopedist.  Your CT head and MRI brain did not show any evidence of stroke.  For any worsening symptoms return to the emergency room.  Follow-up with your primary care provider.  Take Tylenol in addition to the pain medication.

## 2023-05-11 NOTE — ED Notes (Signed)
Pt returned to room, stroke team at bedside.

## 2023-05-11 NOTE — Consult Note (Addendum)
NEUROLOGY CONSULT NOTE   Date of service: May 11, 2023 Patient Name: Nathaniel Waters MRN:  [id number] DOB:  1955-09-13 Chief Complaint: "right arm numbness and pain" Requesting Provider: Royanne Foots, DO  History of Present Illness  Nathaniel Waters is a 68 y.o. male with hx of DM, GERD, HTN, HLD and prior CVA who presents to [facility] ED via private vehicle for acute onset of right shoulder pain and numbness. LKW 2200 on 2/2 prior to bed and then awoke @ 0400 with symptoms. CT head with no acute process, Aspects 10. NIHSS 1 for decreased sensation on right arm. Will obtain MRI brain to rule out acute process.   LKW: 2200 on 2/2 Modified rankin score: 0-Completely asymptomatic and back to baseline post- stroke IV Thrombolysis: No outside window EVT:  No LVO  NIHSS components Score: Comment  1a Level of Conscious 0[x]  1[]  2[]  3[]      1b LOC Questions 0[x]  1[]  2[]       1c LOC Commands 0[x]  1[]  2[]       2 Best Gaze 0[x]  1[]  2[]       3 Visual 0[x]  1[]  2[]  3[]      4 Facial Palsy 0[x]  1[]  2[]  3[]      5a Motor Arm - left 0[x]  1[]  2[]  3[]  4[]  UN[]    5b Motor Arm - Right 0[x]  1[]  2[]  3[]  4[]  UN[]  Limited due to shoulder pain   6a Motor Leg - Left 0[x]  1[]  2[]  3[]  4[]  UN[]    6b Motor Leg - Right 0[x]  1[]  2[]  3[]  4[]  UN[]    7 Limb Ataxia 0[x]  1[]  2[]  3[]  UN[]     8 Sensory 0[]  11 2[]  UN[]      9 Best Language 0[x]  1[]  2[]  3[]      10 Dysarthria 0[x]  1[]  2[]  UN[]      11 Extinct. and Inattention 0[x]  1[]  2[]       TOTAL:       ROS  Comprehensive ROS performed and pertinent positives documented in HPI    Past History   Past Medical History:  Diagnosis Date   Diabetes mellitus    Diabetes mellitus without complication (HCC)    GERD (gastroesophageal reflux disease)    Hyperlipidemia    Hypertension    Pneumonia     Past Surgical History:  Procedure Laterality Date   CARPAL TUNNEL RELEASE     both    COLONOSCOPY  2018   TRANSURETHRAL RESECTION OF PROSTATE N/A 09/27/2021   Procedure:  TRANSURETHRAL RESECTION OF THE PROSTATE (TURP), BIPOLAR;  Surgeon: Jannifer Hick, MD;  Location: [facility];  Service: Urology;  Laterality: N/A;    Family History: Family History  Problem Relation Age of Onset   Breast cancer Mother    Heart disease Brother    Lung cancer Father    Healthy Paternal Grandmother    Cancer Brother    Colon cancer Neg Hx    Esophageal cancer Neg Hx    Stomach cancer Neg Hx    Rectal cancer Neg Hx    Colon polyps Neg Hx     Social History  reports that he quit smoking about 13 months ago. His smoking use included cigarettes. He has never used smokeless tobacco. He reports current alcohol use. He reports that he does not use drugs.  Allergies  Allergen Reactions   Metformin And Related Diarrhea   Jardiance [Empagliflozin] Rash    Medications   Current Facility-Administered Medications:    sodium chloride flush (NS) 0.9 % injection 3 mL, 3  mL, Intravenous, Once, Royanne Foots, DO  Current Outpatient Medications:    aspirin EC 81 MG tablet, Take 1 tablet (81 mg total) by mouth daily. Swallow whole., Disp: , Rfl:    Blood Glucose Monitoring Suppl (ACCU-CHEK GUIDE) w/Device KIT, Use as directed to check blood glucose up to 2 times a day, Disp: 1 kit, Rfl: 0   clopidogrel (PLAVIX) 75 MG tablet, TAKE 1 TABLET BY MOUTH EVERY DAY, Disp: 90 tablet, Rfl: 1   ezetimibe (ZETIA) 10 MG tablet, Take 1 tablet (10 mg total) by mouth daily., Disp: 90 tablet, Rfl: 3   FARXIGA 10 MG TABS tablet, TAKE 1 TABLET BY MOUTH EVERY DAY, Disp: 90 tablet, Rfl: 1   glipiZIDE (GLUCOTROL XL) 5 MG 24 hr tablet, TAKE 1 TABLET BY MOUTH EVERY DAY WITH BREAKFAST, Disp: 90 tablet, Rfl: 1   Lancets (ACCU-CHEK MULTICLIX) lancets, Use to help check blood sugars twice a day, Disp: 102 each, Rfl: 5   rosuvastatin (CRESTOR) 40 MG tablet, Take 1 tablet (40 mg total) by mouth daily., Disp: 90 tablet, Rfl: 3   valsartan-hydrochlorothiazide (DIOVAN-HCT) 160-12.5 MG tablet, Take 1 tablet by mouth  daily., Disp: 90 tablet, Rfl: 3  Vitals   Vitals:   05/11/23 0633 05/11/23 0641 05/11/23 0700 05/11/23 0715  BP: (!) 141/80  (!) 144/77 135/83  Pulse: 90  78 76  Resp: (!) 28  (!) 25 (!) 21  Temp: 97.7 F (36.5 C)     SpO2: 96%  97% 97%  Weight:  99.8 kg    Height:  5\' 11"  (1.803 m)      Body mass index is 30.68 kg/m.  Physical Exam   Constitutional: Appears well-developed and well-nourished.  Psych: Affect appropriate to situation.  Eyes: No scleral injection.  HENT: No OP obstruction.  Head: Normocephalic.  Cardiovascular: Normal rate and regular rhythm.  Respiratory: Effort normal, non-labored breathing.  GI: Soft.  No distension. There is no tenderness.  Skin: WDI.   Neurologic Examination   Mental Status -  Level of arousal and orientation to time, place, and person were intact. Language including expression, naming, repetition, comprehension was assessed and found intact. Attention span and concentration were normal. Recent and remote memory were intact. Fund of Knowledge was assessed and was intact.  Cranial Nerves II - XII - II - Visual field intact OU. III, IV, VI - Extraocular movements intact. V - Facial sensation intact bilaterally. VII - Facial movement intact bilaterally. VIII - Hearing & vestibular intact bilaterally. X - Palate elevates symmetrically. XI - Chin turning & shoulder shrug intact bilaterally. XII - Tongue protrusion intact.  Motor Strength - The patient's strength was normal in left arm, left leg and right leg extremities and pronator drift was absent.   Right arm is limited testing proximally to due to pain in right shoulder with max 3/5 to deltoid with limited ROM. Right grip strength is full 5/5, right triceps weaker than left but limited strength per patient due to pain; right biceps 5/5.  Bulk was normal and fasciculations were absent.   Motor Tone - Muscle tone was assessed at the neck and appendages and was normal, except for  guarding with testing of right shoulder ROM. Sensory - Subjectively decreased to right arm proximally and distally to fingers.  Coordination - The patient had normal movements in the hands and feet with no ataxia or dysmetria when taken in the context of RUE decreased shoulder ROM.  Tremor was absent. Gait and Station -  No ataxia or unstable gait  Labs/Imaging/Neurodiagnostic studies   CBC:  Recent Labs  Lab 2023/05/17 0637 17-May-2023 0643  WBC  --  11.5*  NEUTROABS  --  7.6  HGB 15.3 15.1  HCT 45.0 45.5  MCV  --  91.0  PLT  --  195   Basic Metabolic Panel:  Lab Results  Component Value Date   NA 134 (L) 05/17/2023   K 3.9 05-17-2023   CO2 19 (L) 05/17/23   GLUCOSE 176 (H) 2023-05-17   BUN 20 05/17/23   CREATININE 1.42 (H) 05-17-23   CALCIUM 9.2 May 17, 2023   GFRNONAA 54 (L) 05-17-2023   GFRAA >60 02/21/2018   Lipid Panel:  Lab Results  Component Value Date   LDLCALC 70 09/17/2022   HgbA1c:  Lab Results  Component Value Date   HGBA1C 8.2 (A) 10/15/2022   Urine Drug Screen:     Component Value Date/Time   LABOPIA NONE DETECTED 09/16/2022 0940   COCAINSCRNUR NONE DETECTED 09/16/2022 0940   LABBENZ NONE DETECTED 09/16/2022 0940   AMPHETMU NONE DETECTED 09/16/2022 0940   THCU NONE DETECTED 09/16/2022 0940   LABBARB NONE DETECTED 09/16/2022 0940    Alcohol Level     Component Value Date/Time   ETH <10 09/16/2022 0548   INR  Lab Results  Component Value Date   INR 1.0 2023/05/17   APTT  Lab Results  Component Value Date   APTT 28 05/17/2023   AED levels: No results found for: "PHENYTOIN", "ZONISAMIDE", "LAMOTRIGINE", "LEVETIRACETA"  CT Head without contrast (Personally reviewed): No acute process, aspects 10   MRI Brain: Pending    ASSESSMENT  Hayden Kihara is a 68 y.o. male who presents to the [facility] ED  for acute onset of right shoulder pain and numbness. LKW 2200 on 2/2 prior to bed and then awoke @ 0400 with symptoms.  - CT head with no acute  process.  - Exam pertinent finding of proximal right arm weakness due to pain, with limited ROM at shoulder due to acute pain. There is some triceps weakness on right also due to pain. Biceps normal bilaterally and equal grip strength bilaterally. No weakness or drift noted in left arm, left leg or right leg. Decreased sensation on right arm to touch and temperature, no ataxia noted. No facial droop.  - Overall presentation is most consistent with RUE weakness due to intrinsic shoulder pathology. Will get MRI brain to rule out stroke, although this is felt to be significantly less likely.  RECOMMENDATIONS  - Obtain MRI Brain w/o to rule out stroke. If positive for acute stroke will pursue full stroke workup.  - Xray of right shoulder  ______________________________________________________________________   Gevena Mart DNP, ACNPC-AG  [facility]  I have seen and examined the patient. I have formulated the assessment and recommendations. 68 year old male with acute right shoulder pain and weakness. Exam with right deltoid and triceps weakness, normal strength more distally. No facial droop or RLE weakness. Overall presentation is most consistent with RUE weakness due to intrinsic shoulder pathology. Will get MRI brain to rule out stroke, although this is felt to be significantly less likely. Electronically signed: Dr. Caryl Pina

## 2023-05-11 NOTE — ED Provider Notes (Cosign Needed Addendum)
[place] EMERGENCY DEPARTMENT AT [facility] Provider Note   CSN: [id number] Arrival date & time: 05/11/23  4098     History  Chief Complaint  Patient presents with   Arm Pain   Weakness    Deionte Spivack is a 68 y.o. male.  68 year old male presents today for concern of right arm pain and weakness that started this morning around 4 AM.  He does have history of CVA with some residual right arm numbness.  However no residual weakness in that arm.  He denies any injury to the right arm.  States he went to bed at 10 PM without any weakness or pain.  Denies any vision change, headache, neck pain.  Denies balance issues.  The history is provided by the patient. No language interpreter was used.       Home Medications Prior to Admission medications   Medication Sig Start Date End Date Taking? Authorizing Provider  aspirin EC 81 MG tablet Take 1 tablet (81 mg total) by mouth daily. Swallow whole. 09/19/22 09/14/23 Yes Lurene Shadow, MD  clopidogrel (PLAVIX) 75 MG tablet TAKE 1 TABLET BY MOUTH EVERY DAY 04/21/23  Yes Sagardia, Eilleen Kempf, MD  FARXIGA 10 MG TABS tablet TAKE 1 TABLET BY MOUTH EVERY DAY 04/21/23  Yes Sagardia, Eilleen Kempf, MD  finasteride (PROSCAR) 5 MG tablet Take 5 mg by mouth daily.   Yes [provider]  glipiZIDE (GLUCOTROL XL) 5 MG 24 hr tablet TAKE 1 TABLET BY MOUTH EVERY DAY WITH BREAKFAST 04/21/23  Yes Sagardia, Eilleen Kempf, MD  rosuvastatin (CRESTOR) 40 MG tablet Take 1 tablet (40 mg total) by mouth daily. 10/18/22  Yes Sagardia, Eilleen Kempf, MD  valsartan-hydrochlorothiazide (DIOVAN-HCT) 160-12.5 MG tablet Take 1 tablet by mouth daily. 08/05/22  Yes Sagardia, Eilleen Kempf, MD  ezetimibe (ZETIA) 10 MG tablet Take 1 tablet (10 mg total) by mouth daily. Patient not taking: Reported on 05/11/2023 10/18/22   Georgina Quint, MD      Allergies    Metformin and related and Jardiance [empagliflozin]    Review of Systems   Review of Systems   Constitutional:  Negative for chills and fever.  Eyes:  Negative for visual disturbance.  Neurological:  Positive for weakness and numbness (chronic). Negative for headaches.  All other systems reviewed and are negative.   Physical Exam Updated Vital Signs BP 136/83   Pulse 72   Temp 97.7 F (36.5 C)   Resp (!) 29   Ht 5\' 11"  (1.803 m)   Wt 99.8 kg   SpO2 97%   BMI 30.68 kg/m  Physical Exam Vitals and nursing note reviewed.  Constitutional:      General: He is not in acute distress.    Appearance: Normal appearance. He is not ill-appearing.  HENT:     Head: Normocephalic and atraumatic.     Nose: Nose normal.  Eyes:     Conjunctiva/sclera: Conjunctivae normal.  Pulmonary:     Effort: Pulmonary effort is normal. No respiratory distress.  Musculoskeletal:        General: Tenderness present. No deformity.  Skin:    Findings: No rash.  Neurological:     Mental Status: He is alert and oriented to person, place, and time.     Cranial Nerves: No cranial nerve deficit.     Sensory: Sensory deficit present.     Motor: Weakness present.     Comments: Cranial nerves III through XII intact.  Without facial droop.  Smile symmetrical.  Tenderness to palpation over the right shoulder.  Weakness in the right upper extremity with limited range of motion.  Neurovascularly intact in bilateral upper extremities.     ED Results / Procedures / Treatments   Labs (all labs ordered are listed, but only abnormal results are displayed) Labs Reviewed  CBC - Abnormal; Notable for the following components:      Result Value   WBC 11.5 (*)    All other components within normal limits  COMPREHENSIVE METABOLIC PANEL - Abnormal; Notable for the following components:   Sodium 134 (*)    CO2 19 (*)    Glucose, Bld 176 (*)    Creatinine, Ser 1.42 (*)    GFR, Estimated 54 (*)    All other components within normal limits  CBG MONITORING, ED - Abnormal; Notable for the following components:    Glucose-Capillary 188 (*)    All other components within normal limits  I-STAT CHEM 8, ED - Abnormal; Notable for the following components:   Creatinine, Ser 1.40 (*)    Glucose, Bld 180 (*)    Calcium, Ion 1.07 (*)    TCO2 20 (*)    All other components within normal limits  PROTIME-INR  APTT  DIFFERENTIAL  ETHANOL  I-STAT CHEM 8, ED  CBG MONITORING, ED    EKG None  Radiology DG Shoulder Right Result Date: 05/11/2023 CLINICAL DATA:  Arm pain and weakness. EXAM: RIGHT SHOULDER - 2+ VIEW COMPARISON:  None Available. FINDINGS: No evidence for an acute fracture. No shoulder separation or dislocation. Calcific tendinosis identified in the rotator cuff. Degenerative changes are noted in the glenohumeral joint. IMPRESSION: 1. Calcific tendinosis in the rotator cuff. 2. Degenerative changes in the glenohumeral joint. Electronically Signed   By: Kennith Center M.D.   On: 05/11/2023 08:07   CT HEAD CODE STROKE WO CONTRAST Result Date: 05/11/2023 CLINICAL DATA:  Code stroke.  Right extremity weakness and numbness EXAM: CT HEAD WITHOUT CONTRAST TECHNIQUE: Contiguous axial images were obtained from the base of the skull through the vertex without intravenous contrast. RADIATION DOSE REDUCTION: This exam was performed according to the departmental dose-optimization program which includes automated exposure control, adjustment of the mA and/or kV according to patient size and/or use of iterative reconstruction technique. COMPARISON:  Brain MRI 09/16/2022 FINDINGS: Brain: No evidence of acute infarction, hemorrhage, hydrocephalus, extra-axial collection or mass lesion/mass effect. Background of mild chronic microvascular ischemic change Vascular: No hyperdense vessel or unexpected calcification. Skull: Normal. Negative for fracture or focal lesion. Sinuses/Orbits: No middle ear or mastoid effusion. Paranasal sinuses are clear. Orbits are unremarkable. Other: None. ASPECTS [facility] Stroke Program Early CT  Score): 10 IMPRESSION: No hemorrhage or CT evidence of an acute cortical infarct. Aspects is 10. Findings were paged to Dr. Otelia Limes on 05/11/2023 at 7:10 AM. Electronically Signed   By: Lorenza Cambridge M.D.   On: 05/11/2023 07:13    Procedures .Critical Care  Performed by: Marita Kansas, PA-C Authorized by: Marita Kansas, PA-C   Critical care provider statement:    Critical care time (minutes):  35   Critical care was necessary to treat or prevent imminent or life-threatening deterioration of the following conditions: code stroke.   Critical care was time spent personally by me on the following activities:  Development of treatment plan with patient or surrogate, discussions with consultants, evaluation of patient's response to treatment, examination of patient, ordering and review of laboratory studies, ordering and review of radiographic studies, ordering and performing treatments and interventions,  pulse oximetry, re-evaluation of patient's condition and review of old charts     Medications Ordered in ED Medications  sodium chloride flush (NS) 0.9 % injection 3 mL (0 mLs Intravenous Hold 05/11/23 0649)  fentaNYL (SUBLIMAZE) injection 25 mcg (25 mcg Intravenous Given 05/11/23 1610)    ED Course/ Medical Decision Making/ A&P                                 Medical Decision Making Amount and/or Complexity of Data Reviewed Labs: ordered. Radiology: ordered.  Risk Prescription drug management.   Medical Decision Making / ED Course   This patient presents to the ED for concern of right arm numbness, weakness, pain, this involves an extensive number of treatment options, and is a complaint that carries with it a high risk of complications and morbidity.  The differential diagnosis includes stroke, TIA, MSK etiology  MDM: 68 year old male with previous history of CVA presents for concern of right arm pain and weakness.  He is concern for CVA.  Code stroke was activated.  His last well-known  normal was 10 PM.  He woke at 4 AM with the symptoms.  He states he has had some residual numbness but the pain and weakness are new this morning.  Code stroke activated.  CBC shows mild leukocytosis but no anemia.  CMP shows creatinine 1.42 which is chronic, glucose 176 otherwise without acute concern.  Differential unremarkable, ethanol level normal.  CT head without acute intracranial finding.  MRI brain negative for stroke.  Right shoulder x-ray shows some calcific tendinosis of the rotator cuff.  Will place in a sling.  Discussed pain control and follow-up with orthopedist.  Patient is agreeable.  Discharged in stable condition.  Return precaution discussed.  Patient voices understanding and is in agreement with plan.   Additional history obtained: -Additional history obtained from chart review concerning his previous stroke. -External records from outside source obtained and reviewed including: Chart review including previous notes, labs, imaging, consultation notes   Lab Tests: -I ordered, reviewed, and interpreted labs.   The pertinent results include:   Labs Reviewed  CBC - Abnormal; Notable for the following components:      Result Value   WBC 11.5 (*)    All other components within normal limits  COMPREHENSIVE METABOLIC PANEL - Abnormal; Notable for the following components:   Sodium 134 (*)    CO2 19 (*)    Glucose, Bld 176 (*)    Creatinine, Ser 1.42 (*)    GFR, Estimated 54 (*)    All other components within normal limits  CBG MONITORING, ED - Abnormal; Notable for the following components:   Glucose-Capillary 188 (*)    All other components within normal limits  I-STAT CHEM 8, ED - Abnormal; Notable for the following components:   Creatinine, Ser 1.40 (*)    Glucose, Bld 180 (*)    Calcium, Ion 1.07 (*)    TCO2 20 (*)    All other components within normal limits  PROTIME-INR  APTT  DIFFERENTIAL  ETHANOL  I-STAT CHEM 8, ED  CBG MONITORING, ED      EKG  EKG  Interpretation Date/Time:    Ventricular Rate:    PR Interval:    QRS Duration:    QT Interval:    QTC Calculation:   R Axis:      Text Interpretation:  Imaging Studies ordered: I ordered imaging studies including CT head, MRI brain, right shoulder x-ray I independently visualized and interpreted imaging. I agree with the radiologist interpretation   Medicines ordered and prescription drug management: Meds ordered this encounter  Medications   sodium chloride flush (NS) 0.9 % injection 3 mL   fentaNYL (SUBLIMAZE) injection 25 mcg   fentaNYL (SUBLIMAZE) injection 50 mcg    -I have reviewed the patients home medicines and have made adjustments as needed  Critical interventions Code stroke activation Seen by neurology.  Reevaluation: After the interventions noted above, I reevaluated the patient and found that they have :improved  Co morbidities that complicate the patient evaluation  Past Medical History:  Diagnosis Date   Diabetes mellitus    Diabetes mellitus without complication (HCC)    GERD (gastroesophageal reflux disease)    Hyperlipidemia    Hypertension    Pneumonia       Dispostion: Discharged in stable condition.  Return precaution discussed.   Final Clinical Impression(s) / ED Diagnoses Final diagnoses:  Acute pain of right shoulder  Right arm weakness    Rx / DC Orders ED Discharge Orders          Ordered    oxyCODONE (ROXICODONE) 5 MG immediate release tablet  Every 4 hours PRN        05/11/23 1118              Marita Kansas, PA-C 05/11/23 1119    [address] PA-C 05/11/23 1245

## 2023-05-11 NOTE — ED Notes (Signed)
 Patient transported to MRI

## 2023-05-11 NOTE — Code Documentation (Signed)
Mr. Nathaniel Waters is a 69 yr old male with a PMH of Stroke, DM, smoking, HLD presenting to [facility] via POV on 05/11/2023. Pt is coming from home where he was LKW last night at 2200. When he woke up this morning at 0400, his rt arm was painful and had decreased sensation.He is on dual anti-platelet therapy.    Pt met by stroke team in CT scanner. He is on monitor, BP is WNL and CBG has been obtained. NIHSS 1, pt with rt arm sensory loss. The following imaging was completed: CT head. Per Dr. Otelia Limes, CT is negative for hemorrhage.     Pt returned to trauma C where his workup will continue. He will need q 2 VS and NIHSS. Pt is ineligible for thrombolytics due to LKW is outside of the treatment window. He is not candidate for NIR as no LVO symptoms. Bedside handoff with ED RN complete.

## 2023-05-12 ENCOUNTER — Telehealth: Payer: Self-pay

## 2023-05-12 NOTE — Transitions of Care (Post Inpatient/ED Visit) (Signed)
   05/12/2023  Name: Nathaniel Waters MRN: [id number] DOB: [birth year]  Today's TOC FU Call Status: Today's TOC FU Call Status:: Successful TOC FU Call Completed TOC FU Call Complete Date: 05/12/23 Patient's Name and Date of Birth confirmed.  Transition Care Management Follow-up Telephone Call Date of Discharge: 05/11/23 Discharge Facility: [facility]) (Acute pain of right shoulder) Type of Discharge: Emergency Department Reason for ED Visit: Other: (Acute pain of right shoulder) How have you been since you were released from the hospital?: Better Any questions or concerns?: No  Items Reviewed: Did you receive and understand the discharge instructions provided?: Yes Medications obtained,verified, and reconciled?: Yes (Medications Reviewed) Any new allergies since your discharge?: No Dietary orders reviewed?: NA Do you have support at home?: Yes  Medications Reviewed Today: Medications Reviewed Today     Reviewed by Lang Avelina PARAS, CMA (Certified Medical Assistant) on 05/12/23 at 1646  Med List Status: <None>   Medication Order Taking? Sig Documenting Provider Last Dose Status Informant  aspirin  EC 81 MG tablet [id number] No Take 1 tablet (81 mg total) by mouth daily. Swallow whole. Jens Durand, MD 05/11/2023 Morning Active Self, Pharmacy Records  clopidogrel  (PLAVIX ) 75 MG tablet [id number] No TAKE 1 TABLET BY MOUTH EVERY DAY Purcell Emil Schanz, MD 05/11/2023  5:00 AM Active Self, Pharmacy Records  ezetimibe  (ZETIA ) 10 MG tablet [id number] No Take 1 tablet (10 mg total) by mouth daily.  Patient not taking: Reported on 05/11/2023   Purcell Emil Schanz, MD Not Taking Active Self, Pharmacy Records  FARXIGA  10 MG TABS tablet [id number] No TAKE 1 TABLET BY MOUTH EVERY DAY Purcell Emil Schanz, MD 05/11/2023 Morning Active Self, Pharmacy Records  finasteride (PROSCAR) 5 MG tablet [id number] No Take 5 mg by mouth daily. [provider] 05/11/2023 Morning Active Self, Pharmacy  Records  glipiZIDE  (GLUCOTROL  XL) 5 MG 24 hr tablet [id number] No TAKE 1 TABLET BY MOUTH EVERY DAY WITH BREAKFAST Sagardia, Miguel Jose, MD 05/11/2023 Morning Active Self, Pharmacy Records  oxyCODONE  (ROXICODONE ) 5 MG immediate release tablet [id number]  Take 1 tablet (5 mg total) by mouth every 4 (four) hours as needed for severe pain (pain score 7-10). Hildegard Loge, PA-C  Active   rosuvastatin  (CRESTOR ) 40 MG tablet [id number] No Take 1 tablet (40 mg total) by mouth daily. Sagardia, Miguel Jose, MD 05/11/2023 Morning Active Self, Pharmacy Records  valsartan -hydrochlorothiazide  (DIOVAN -HCT) 160-12.5 MG tablet [id number] No Take 1 tablet by mouth daily. Purcell Emil Schanz, MD 05/11/2023 Morning Active Self, Pharmacy Records            Home Care and Equipment/Supplies: Were Home Health Services Ordered?: NA Any new equipment or medical supplies ordered?: NA  Functional Questionnaire: Do you need assistance with bathing/showering or dressing?: No Do you need assistance with meal preparation?: No Do you need assistance with eating?: No Do you have difficulty maintaining continence: No Do you need assistance with getting out of bed/getting out of a chair/moving?: No Do you have difficulty managing or taking your medications?: No  Follow up appointments reviewed: PCP Follow-up appointment confirmed?: NA (declined- scheduled with Ortho) Specialist Hospital Follow-up appointment confirmed?: Yes Date of Specialist follow-up appointment?: 05/13/23 Follow-Up Specialty Provider:: Persons, Ronal Dragon, PA @ [facility] Do you need transportation to your follow-up appointment?: No Do you understand care options if your condition(s) worsen?: Yes-patient verbalized understanding    SIGNATURE Avelina Lang, CMA (AAMA)  [facility] [phone]

## 2023-05-13 ENCOUNTER — Ambulatory Visit (INDEPENDENT_AMBULATORY_CARE_PROVIDER_SITE_OTHER): Payer: Medicare Other | Admitting: Physician Assistant

## 2023-05-13 ENCOUNTER — Encounter: Payer: Self-pay | Admitting: Physician Assistant

## 2023-05-13 DIAGNOSIS — M7531 Calcific tendinitis of right shoulder: Secondary | ICD-10-CM | POA: Insufficient documentation

## 2023-05-13 NOTE — Progress Notes (Signed)
 Office Visit Note   Patient: Nathaniel Waters           Date of Birth: Apr 06, 1956           MRN: [id number] Visit Date: 05/13/2023              Requested by: Nathaniel Emil Schanz, MD [address] [place],  [place] [address] PCP: Nathaniel Emil Schanz, MD   Assessment & Plan: Visit Diagnoses:  1. Calcific tendonitis of right shoulder     Plan: Nathaniel Waters is a pleasant 68 year old gentleman who presents today with right shoulder pain.  He has a history of a CVA approximately 8 months ago that did affect some of the sensation in his right arm.  He is currently on Plavix .  He also has a history of diabetes with hemoglobin A1c of 8.2 most recently.  He woke up over the weekend with severe pain in his right shoulder.  He thinks he just slept wrong on his side but because of his history he did go to the emergency room.  He had a brain MRI and was thoroughly evaluated to make sure there was no recurrence or any new CVA.  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  He actually is doing much better now.  We discussed this his range of motion is actually very good without pain negative drop arm test negative empty can test.  Motion is overall quite good strength can does not really have much deficit compared to the unaffected side.  His sensation is at his baseline.  He does have a strengthening routine for his right arm that he learned when he was rehabbing from his stroke.  I encouraged him to go back to doing this being mindful of any pain.  I also talked about the importance of keeping motion in that shoulder as he is at risk for adhesive capsulitis.  If he had return of severe symptoms could consider a shot although his hemoglobin A1c might limit this have recommended he have this rechecked in the near future  Follow-Up Instructions: No follow-ups on file.   Orders:  No orders of the defined types were placed in  this encounter.  No orders of the defined types were placed in this encounter.     Procedures: No procedures performed   Clinical Data: No additional findings.   Subjective: Chief Complaint  Patient presents with   Left Shoulder - Pain    HPI pleasant 68 year old right-hand-dominant gentleman with a chief complaint of right shoulder pain no injury he thinks he just slept on it wrong this happened over the weekend and as he has a history of a CVA he was fully evaluated at the emergency room to rule out any neurologic cause.  He did get some Percocet but his pain seems to have significantly decreased as well as his motion improved  Review of Systems  All other systems reviewed and are negative.    Objective: Vital Signs: There were no vitals taken for this visit.  Physical Exam Constitutional:      Appearance: Normal appearance.  Skin:    General: Skin is warm and dry.  Neurological:     General: No focal deficit present.     Mental Status: He is alert and oriented to person, place, and time.  Psychiatric:        Mood and Affect: Mood normal.  Behavior: Behavior normal.     Ortho Exam Right shoulder he has forward elevation actively to about 165 degrees.  He has internal rotation behind his back to the top of his belt neither of these is painful.  He has good strength with resisted abduction external rotation good grip strength.  Pulses are intact.  Mildly positive empty can test negative speeds test negative impingement findings today Specialty Comments:  No specialty comments available.  Imaging: No results found.   PMFS History: Patient Active Problem List   Diagnosis Date Noted   Calcific tendonitis of right shoulder 05/13/2023   History of stroke 10/15/2022   Current smoker 11/18/2021   BPH (benign prostatic hyperplasia) 09/27/2021   BPH with obstruction/lower urinary tract symptoms 09/18/2021   Chronic hepatitis C without hepatic coma (HCC)  04/11/2019   Hypertension associated with diabetes (HCC) 04/09/2017   Cigarette nicotine  dependence with nicotine -induced disorder 04/09/2017   Dyslipidemia associated with type 2 diabetes mellitus (HCC) 10/23/2014   Dyslipidemia 09/22/2014   Elevated PSA 09/22/2014   GERD 12/30/2007   Type II diabetes mellitus, uncontrolled 12/29/2007   History of colonic polyps 12/29/2007   Past Medical History:  Diagnosis Date   Diabetes mellitus    Diabetes mellitus without complication (HCC)    GERD (gastroesophageal reflux disease)    Hyperlipidemia    Hypertension    Pneumonia     Family History  Problem Relation Age of Onset   Breast cancer Mother    Heart disease Brother    Lung cancer Father    Healthy Paternal Grandmother    Cancer Brother    Colon cancer Neg Hx    Esophageal cancer Neg Hx    Stomach cancer Neg Hx    Rectal cancer Neg Hx    Colon polyps Neg Hx     Past Surgical History:  Procedure Laterality Date   CARPAL TUNNEL RELEASE     both    COLONOSCOPY  2018   TRANSURETHRAL RESECTION OF PROSTATE N/A 09/27/2021   Procedure: TRANSURETHRAL RESECTION OF THE PROSTATE (TURP), BIPOLAR;  Surgeon: Selma Donnice SAUNDERS, MD;  Location: [facility];  Service: Urology;  Laterality: N/A;   Social History   Occupational History   Occupation: [employer]  Tobacco Use   Smoking status: Former    Current packs/day: 0.00    Types: Cigarettes    Quit date: 2024    Years since quitting: 1.0   Smokeless tobacco: Never   Tobacco comments:    Actively in the process of quitting  Vaping Use   Vaping status: Never Used  Substance and Sexual Activity   Alcohol use: Yes    Comment: occasionally on weekend   Drug use: No   Sexual activity: Not on file

## 2023-05-27 ENCOUNTER — Other Ambulatory Visit: Payer: Medicare Other | Admitting: Pharmacist

## 2023-05-27 DIAGNOSIS — E1169 Type 2 diabetes mellitus with other specified complication: Secondary | ICD-10-CM

## 2023-05-27 NOTE — Progress Notes (Signed)
05/27/2023 Name: Nathaniel Waters MRN: [id number] DOB: [birth year]  Chief Complaint  Patient presents with   True [place] Metric Diabetes    Nathaniel Waters is a 68 y.o. year old male who presented for a telephone visit.   They were referred to the pharmacist by a quality report for assistance in managing diabetes.   Subjective:  Care Team: Primary Care Provider: Georgina Quint, MD ; Next Scheduled Visit: not scheduled  Medication Access/Adherence  Current Pharmacy:  [id number] - [place], [place] - [address] AT [facility] [address] [place] [address] Phone: [phone] Fax: [phone]  [place] - [facility] [address] [place] [address] Phone: [phone] Fax: [phone]  [id number] - [address] ([place]), [place] - [address] [place] [address] Phone: [phone] Fax: [phone]  [id number] - [place], [place] - [address] AT [facility] [address] [name] [address] Phone: [phone] Fax: [phone]   Patient reports affordability concerns with their medications: Yes  Patient reports access/transportation concerns to their pharmacy: No  Patient reports adherence concerns with their medications:  No    Pt reports he did not start Trulicity last year due to cost of the medication  Pt reports BP systolic has been 117-125  Diabetes:  Current medications: Farxiga 10 mg daily, glipizide XL 5 mg daily Medications tried in the past: metformin  Current glucose readings: BG 187 this AM, ate late. Typically 130-140 fasting  Using glucometer; testing 1 time daily   Current physical activity: has not been walking as much due to weather. He notes he did recently get a treadmill/walking pad for his home and plans to start walking more.    Objective:  Lab Results   Component Value Date   HGBA1C 8.2 (A) 10/15/2022    Lab Results  Component Value Date   CREATININE 1.42 (H) 05/11/2023   BUN 20 05/11/2023   NA 134 (L) 05/11/2023   K 3.9 05/11/2023   CL 104 05/11/2023   CO2 19 (L) 05/11/2023    Lab Results  Component Value Date   CHOL 152 09/17/2022   HDL 36 (L) 09/17/2022   LDLCALC 70 09/17/2022   LDLDIRECT 206.0 07/09/2021   TRIG 228 (H) 09/17/2022   CHOLHDL 4.2 09/17/2022    Medications Reviewed Today     Reviewed by Bonita Quin, RPH (Pharmacist) on 05/27/23 at 1014  Med List Status: <None>   Medication Order Taking? Sig Documenting Provider Last Dose Status Informant  aspirin EC 81 MG tablet [id number] Yes Take 1 tablet (81 mg total) by mouth daily. Swallow whole. Lurene Shadow, MD Taking Active Self, Pharmacy Records  clopidogrel (PLAVIX) 75 MG tablet [id number] Yes TAKE 1 TABLET BY MOUTH EVERY DAY Sagardia, Eilleen Kempf, MD Taking Active Self, Pharmacy Records  ezetimibe (ZETIA) 10 MG tablet [id number] Yes Take 1 tablet (10 mg total) by mouth daily. Georgina Quint, MD Taking Active Self, Pharmacy Records  FARXIGA 10 [place] TABS tablet [id number] Yes TAKE 1 TABLET BY MOUTH EVERY DAY Georgina Quint, MD Taking Active Self, Pharmacy Records  finasteride (PROSCAR) 5 MG tablet [id number] No Take 5 mg by mouth daily.  Patient not taking: Reported on 05/27/2023   [provider] Not Taking Active Self, Pharmacy Records  glipiZIDE (GLUCOTROL XL) 5 MG 24 hr tablet [id number] Yes TAKE 1 TABLET BY  MOUTH EVERY DAY WITH BREAKFAST Sagardia, Eilleen Kempf, MD Taking Active Self, Pharmacy Records  oxyCODONE (ROXICODONE) 5 MG immediate release tablet [id number]  Take 1 tablet (5 mg total) by mouth every 4 (four) hours as needed for severe pain (pain score 7-10). Marita Kansas, PA-C  Active   rosuvastatin (CRESTOR) 40 MG tablet [id number] Yes Take 1 tablet (40 mg total) by mouth daily. Georgina Quint, MD Taking Active Self, Pharmacy  Records  valsartan-hydrochlorothiazide [facility]) 160-12.5 MG tablet [id number] Yes Take 1 tablet by mouth daily. Georgina Quint, MD Taking Active Self, Pharmacy Records              Assessment/Plan:   Diabetes: - Currently uncontrolled, A1c goal <7% - Reviewed goal A1c, goal fasting, and goal 2 hour post prandial glucose - Reviewed dietary modifications including increasing protein/fiber, balanced meals/snacks - Reviewed lifestyle modifications including: increasing exercise/movement - Recommend to continue current regimen. Wait for next A1c check. If A1c remains elevated, recommend GLP-1 agonist. Should be more affordable this year since he has met his deductible. Can help if cost remains an issue.  - Recommend to check glucose daily, checking occasionally in the evenings   Follow Up Plan: March after PCP appt  Arbutus Leas, PharmD, BCPS, CPP Clinical Pharmacist Practitioner [facility] [phone]

## 2023-05-27 NOTE — Patient Instructions (Signed)
It was a pleasure speaking with you today!  Continue your current regimen. Check blood sugars occasionally in the evenings.   Schedule appointment with Dr. Alvy Bimler for March and I will check back to see how your A1c looks.  Feel free to call with any questions or concerns!  Arbutus Leas, PharmD, BCPS, CPP Clinical Pharmacist Practitioner [facility] [phone]

## 2023-06-11 ENCOUNTER — Telehealth: Payer: Self-pay

## 2023-06-11 ENCOUNTER — Ambulatory Visit: Payer: Medicare Other

## 2023-06-11 VITALS — Ht 71.0 in | Wt 220.0 lb

## 2023-06-11 DIAGNOSIS — Z0000 Encounter for general adult medical examination without abnormal findings: Secondary | ICD-10-CM | POA: Diagnosis not present

## 2023-06-11 DIAGNOSIS — Z122 Encounter for screening for malignant neoplasm of respiratory organs: Secondary | ICD-10-CM

## 2023-06-11 DIAGNOSIS — Z860100 Personal history of colon polyps, unspecified: Secondary | ICD-10-CM

## 2023-06-11 NOTE — Patient Instructions (Addendum)
 Mr. Nathaniel Waters , Thank you for taking time to come for your Medicare Wellness Visit. I appreciate your ongoing commitment to your health goals. Please review the following plan we discussed and let me know if I can assist you in the future.   Referrals/Orders/Follow-Ups/Clinician Recommendations: Aim for 30 minutes of exercise or brisk walking, 6-8 glasses of water, and 5 servings of fruits and vegetables each day. Educated on the Influenza, Shingles, Pneumonia, and COVID vaccines for 2025.  Ordered a Lung Cancer Screening test and referred to Dr Rhea Belton (GI) for a repeat Colonoscopy.  This is a list of the screening recommended for you and due dates:  Health Maintenance  Topic Date Due   Pneumonia Vaccine (1 of 2 - PCV) Never done   Screening for Lung Cancer  Never done   Zoster (Shingles) Vaccine (1 of 2) Never done   Complete foot exam   12/10/2017   Flu Shot  11/06/2022   COVID-19 Vaccine (1 - 2024-25 season) Never done   Hemoglobin A1C  04/17/2023   Colon Cancer Screening  08/24/2023   Eye exam for diabetics  07/28/2023   Yearly kidney health urinalysis for diabetes  08/05/2023   Yearly kidney function blood test for diabetes  05/10/2024   Medicare Annual Wellness Visit  06/10/2024   DTaP/Tdap/Td vaccine (3 - Td or Tdap) 03/21/2029   Hepatitis C Screening  Completed   HPV Vaccine  Aged Out    Advanced directives: (In Chart) A copy of your advanced directives are scanned into your chart should your provider ever need it.  Next Medicare Annual Wellness Visit scheduled for next year: Yes - 2026

## 2023-06-11 NOTE — Telephone Encounter (Signed)
 Patient was identified as falling into the True [place] Measure - Diabetes.   Patient was: Appointment scheduled with primary care provider in the next 30 days.  Patient has his physical scheduled next week.

## 2023-06-11 NOTE — Progress Notes (Signed)
 Subjective:   Nathaniel Waters is a 68 y.o. who presents for a Medicare Wellness preventive visit.  Visit Complete: Virtual I connected with  Nathaniel Waters on 06/11/23 by a audio enabled telemedicine application and verified that I am speaking with the correct person using two identifiers.  Patient Location: Home  Provider Location: Office/Clinic  I discussed the limitations of evaluation and management by telemedicine. The patient expressed understanding and agreed to proceed.  Vital Signs: Because this visit was a virtual/telehealth visit, some criteria may be missing or patient reported. Any vitals not documented were not able to be obtained and vitals that have been documented are patient reported.  VideoDeclined- This patient declined Librarian, academic. Therefore the visit was completed with audio only.  AWV Questionnaire: No: Patient Medicare AWV questionnaire was not completed prior to this visit.  Cardiac Risk Factors include: advanced age (>36men, >24 women);hypertension;dyslipidemia;diabetes mellitus;male gender;obesity (BMI >30kg/m2)     Objective:    Today's Vitals   06/11/23 1006  Weight: 220 lb (99.8 kg)  Height: 5\' 11"  (1.803 m)   Body mass index is 30.68 kg/m.     06/11/2023   10:04 AM 05/11/2023    6:45 AM 09/30/2022   11:58 AM 09/16/2022   12:11 PM 09/16/2022   12:04 PM 06/10/2022   10:13 AM 09/27/2021    7:00 PM  Advanced Directives  Does Patient Have a Medical Advance Directive? Yes No No  No Yes No  Type of Estate agent of [place];Living will     Healthcare Power of [place];Living will   Does patient want to make changes to medical advance directive? No - Patient declined        Copy of Healthcare Power of Attorney in Chart? Yes - validated most recent copy scanned in chart (See row information)     No - copy requested   Would patient like information on creating a medical advance directive?   No - Patient  declined No - Guardian declined   No - Patient declined    Current Medications (verified) Outpatient Encounter Medications as of 06/11/2023  Medication Sig   aspirin EC 81 MG tablet Take 1 tablet (81 mg total) by mouth daily. Swallow whole.   clopidogrel (PLAVIX) 75 MG tablet TAKE 1 TABLET BY MOUTH EVERY DAY   ezetimibe (ZETIA) 10 MG tablet Take 1 tablet (10 mg total) by mouth daily.   FARXIGA 10 MG TABS tablet TAKE 1 TABLET BY MOUTH EVERY DAY   finasteride (PROSCAR) 5 MG tablet Take 5 mg by mouth daily.   glipiZIDE (GLUCOTROL XL) 5 MG 24 hr tablet TAKE 1 TABLET BY MOUTH EVERY DAY WITH BREAKFAST   rosuvastatin (CRESTOR) 40 MG tablet Take 1 tablet (40 mg total) by mouth daily.   valsartan-hydrochlorothiazide (DIOVAN-HCT) 160-12.5 MG tablet Take 1 tablet by mouth daily.   [DISCONTINUED] oxyCODONE (ROXICODONE) 5 MG immediate release tablet Take 1 tablet (5 mg total) by mouth every 4 (four) hours as needed for severe pain (pain score 7-10).   No facility-administered encounter medications on file as of 06/11/2023.    Allergies (verified) Metformin and related and Jardiance [empagliflozin]   History: Past Medical History:  Diagnosis Date   Diabetes mellitus    Diabetes mellitus without complication (HCC)    GERD (gastroesophageal reflux disease)    Hyperlipidemia    Hypertension    Pneumonia    Past Surgical History:  Procedure Laterality Date   CARPAL TUNNEL RELEASE  both    COLONOSCOPY  2018   TRANSURETHRAL RESECTION OF PROSTATE N/A 09/27/2021   Procedure: TRANSURETHRAL RESECTION OF THE PROSTATE (TURP), BIPOLAR;  Surgeon: Jannifer Hick, MD;  Location: [facility];  Service: Urology;  Laterality: N/A;   Family History  Problem Relation Age of Onset   Breast cancer Mother    Heart disease Brother    Lung cancer Father    Healthy Paternal Grandmother    Cancer Brother    Colon cancer Neg Hx    Esophageal cancer Neg Hx    Stomach cancer Neg Hx    Rectal cancer Neg Hx    Colon  polyps Neg Hx    Social History   Socioeconomic History   Marital status: Divorced    Spouse name: Not on file   Number of children: 4   Years of education: 11   Highest education level: Not on file  Occupational History   Occupation: Loading [employer]  Tobacco Use   Smoking status: Former    Current packs/day: 0.00    Average packs/day: 2.0 packs/day for 51.4 years (102.9 ttl pk-yrs)    Types: Cigarettes    Start date: 4    Quit date: 09/16/2022    Years since quitting: 0.7    Passive exposure: Past   Smokeless tobacco: Former    Quit date: 09/16/2022   Tobacco comments:    Quit smoking 09/2022  Vaping Use   Vaping status: Never Used  Substance and Sexual Activity   Alcohol use: Yes    Alcohol/week: 3.0 standard drinks of alcohol    Types: 1 Cans of beer, 2 Shots of liquor per week    Comment: occasionally on weekend   Drug use: No   Sexual activity: Not Currently  Other Topics Concern   Not on file  Social History Narrative   ** Merged History Encounter **       Fun: Listen to music and be around family and friends. Denies religious beliefs effecting health care.    Social Drivers of Corporate investment banker Strain: Low Risk  (06/11/2023)   Overall Financial Resource Strain (CARDIA)    Difficulty of Paying Living Expenses: Not hard at all  Food Insecurity: No Food Insecurity (06/11/2023)   Hunger Vital Sign    Worried About Running Out of Food in the Last Year: Never true    Ran Out of Food in the Last Year: Never true  Transportation Needs: No Transportation Needs (06/11/2023)   PRAPARE - Administrator, Civil Service (Medical): No    Lack of Transportation (Non-Medical): No  Physical Activity: Sufficiently Active (06/11/2023)   Exercise Vital Sign    Days of Exercise per Week: 7 days    Minutes of Exercise per Session: 60 min  Stress: No Stress Concern Present (06/11/2023)   [employer] of Occupational Health - Occupational Stress Questionnaire     Feeling of Stress : Not at all  Social Connections: Socially Isolated (06/11/2023)   Social Connection and Isolation Panel [NHANES]    Frequency of Communication with Friends and Family: More than three times a week    Frequency of Social Gatherings with Friends and Family: Once a week    Attends Religious Services: Never    Database administrator or Organizations: No    Attends Banker Meetings: Never    Marital Status: Divorced    Tobacco Counseling - Former Smoker (>23yrs) Counseling given: Yes Tobacco comments: Quit smoking  09/2022 Lung Cancer Screening test ordered on 06/11/2023.   Clinical Intake:  Pre-visit preparation completed: Yes  Pain : No/denies pain     BMI - recorded: 30.68 Nutritional Status: BMI > 30  Obese Nutritional Risks: None Diabetes: Yes CBG done?: Yes CBG resulted in Enter/ Edit results?: Yes (results - 160 (checked fasting this morning)) Did pt. bring in CBG monitor from home?: No  How often do you need to have someone help you when you read instructions, pamphlets, or other written materials from your doctor or pharmacy?: 1 - Never  Interpreter Needed?: No  Information entered by :: Hassell Halim, CMA   Activities of Daily Living     06/11/2023   10:09 AM 09/16/2022   12:04 PM  In your present state of health, do you have any difficulty performing the following activities:  Hearing? 0 0  Vision? 0 0  Difficulty concentrating or making decisions? 0 0  Walking or climbing stairs? 0 0  Dressing or bathing? 0 0  Doing errands, shopping? 0 0  Preparing Food and eating ? N   Using the Toilet? N   In the past six months, have you accidently leaked urine? N   Do you have problems with loss of bowel control? N   Managing your Medications? N   Managing your Finances? N   Housekeeping or managing your Housekeeping? N     Patient Care Team: Georgina Quint, MD as PCP - [facility], [facility] as  Consulting Physician (Ophthalmology) Dairl Ponder, MD as Consulting Physician (Optometry)  Indicate any recent Medical Services you may have received from other than [facility] providers in the past year (date may be approximate).     Assessment:   This is a routine wellness examination for [facility].  Hearing/Vision screen Hearing Screening - Comments:: Denies hearing difficulties   Vision Screening - Comments:: Wears rx glasses - up to date with routine eye exams with [facility]   Goals Addressed               This Visit's Progress     Patient Stated (pt-stated)        Patient stated he'd like to lose weight (about 10lbs) and stay active.       Depression Screen     06/11/2023   10:15 AM 10/15/2022   11:03 AM 08/05/2022   10:01 AM 06/10/2022   10:13 AM 05/06/2022   10:58 AM 09/18/2021   10:22 AM 07/09/2021    3:58 PM  PHQ 2/9 Scores  PHQ - 2 Score 0 0 0 0 0 0 0  PHQ- 9 Score 0   0       Fall Risk     06/11/2023   10:17 AM 10/15/2022   11:02 AM 08/05/2022   10:01 AM 06/10/2022   10:13 AM 05/06/2022   10:58 AM  Fall Risk   Falls in the past year? 0 0 0 0 0  Number falls in past yr: 0 0 0 0 0  Injury with Fall? 0 0 0 0 0  Risk for fall due to : No Fall Risks History of fall(s) No Fall Risks No Fall Risks No Fall Risks  Follow up Falls prevention discussed;Falls evaluation completed Falls evaluation completed Falls evaluation completed Falls prevention discussed Falls evaluation completed    MEDICARE RISK AT HOME:  Medicare Risk at Home Any stairs in or around the home?: Yes (outside) If so, are there any without handrails?: No  Home free of loose throw rugs in walkways, pet beds, electrical cords, etc?: Yes Adequate lighting in your home to reduce risk of falls?: Yes Life alert?: No (watch) Use of a cane, walker or w/c?: No Grab bars in the bathroom?: No Shower chair or bench in shower?: No Elevated toilet seat or a handicapped toilet?: No  TIMED UP AND  GO:  Was the test performed?  No  Cognitive Function: 6CIT completed        06/11/2023   10:18 AM 06/10/2022   10:18 AM  6CIT Screen  What Year? 0 points 0 points  What month? 0 points 0 points  What time? 0 points 0 points  Count back from 20 0 points 0 points  Months in reverse 0 points 0 points  Repeat phrase 0 points 0 points  Total Score 0 points 0 points    Immunizations Immunization History  Administered Date(s) Administered   Influenza,inj,Quad PF,6+ Mos 12/10/2016, 02/18/2018, 01/15/2019   Tdap 09/24/2015, 03/22/2019    Screening Tests Health Maintenance  Topic Date Due   Pneumonia Vaccine 65+ Years old (1 of 2 - PCV) Never done   Lung Cancer Screening  Never done   Zoster Vaccines- Shingrix (1 of 2) Never done   FOOT EXAM  12/10/2017   INFLUENZA VACCINE  11/06/2022   COVID-19 Vaccine (1 - 2024-25 season) Never done   HEMOGLOBIN A1C  04/17/2023   Colonoscopy  08/24/2023   OPHTHALMOLOGY EXAM  07/28/2023   Diabetic kidney evaluation - Urine ACR  08/05/2023   Diabetic kidney evaluation - eGFR measurement  05/10/2024   Medicare Annual Wellness (AWV)  06/10/2024   DTaP/Tdap/Td (3 - Td or Tdap) 03/21/2029   Hepatitis C Screening  Completed   HPV VACCINES  Aged Out    Health Maintenance  Health Maintenance Due  Topic Date Due   Pneumonia Vaccine 25+ Years old (1 of 2 - PCV) Never done   Lung Cancer Screening  Never done   Zoster Vaccines- Shingrix (1 of 2) Never done   FOOT EXAM  12/10/2017   INFLUENZA VACCINE  11/06/2022   COVID-19 Vaccine (1 - 2024-25 season) Never done   HEMOGLOBIN A1C  04/17/2023   Colonoscopy  08/24/2023   Health Maintenance Items Addressed:06/11/2023  Referral sent to GI for colonoscopy, Lung Cancer Screening ordered 06/11/2023  Additional Screening:  Vision Screening: Recommended annual ophthalmology exams for early detection of glaucoma and other disorders of the eye. Patient stated that he has annual eye exam with [facility].  Dental Screening: Recommended annual dental exams for proper oral hygiene  Community Resource Referral / Chronic Care Management: CRR required this visit?  No   CCM required this visit?  No     Plan:     I have personally reviewed and noted the following in the patient's chart:   Medical and social history Use of alcohol, tobacco or illicit drugs  Current medications and supplements including opioid prescriptions. Patient is not currently taking opioid prescriptions. Functional ability and status Nutritional status Physical activity Advanced directives List of other physicians Hospitalizations, surgeries, and ER visits in previous 12 months Vitals Screenings to include cognitive, depression, and falls Referrals and appointments  In addition, I have reviewed and discussed with patient certain preventive protocols, quality metrics, and best practice recommendations. A written personalized care plan for preventive services as well as general preventive health recommendations were provided to patient.     Darreld Mclean, CMA   06/11/2023  After Visit Summary: (Mail) Due to this being a telephonic visit, the after visit summary with patients personalized plan was offered to patient via mail   Notes: Please refer to Routing Comments.

## 2023-06-16 ENCOUNTER — Encounter: Payer: Self-pay | Admitting: Emergency Medicine

## 2023-06-16 ENCOUNTER — Ambulatory Visit (INDEPENDENT_AMBULATORY_CARE_PROVIDER_SITE_OTHER): Admitting: Emergency Medicine

## 2023-06-16 VITALS — BP 120/80 | HR 94 | Temp 98.5°F | Ht 71.0 in | Wt 221.0 lb

## 2023-06-16 DIAGNOSIS — Z125 Encounter for screening for malignant neoplasm of prostate: Secondary | ICD-10-CM | POA: Diagnosis not present

## 2023-06-16 DIAGNOSIS — Z0001 Encounter for general adult medical examination with abnormal findings: Secondary | ICD-10-CM | POA: Diagnosis not present

## 2023-06-16 DIAGNOSIS — E1169 Type 2 diabetes mellitus with other specified complication: Secondary | ICD-10-CM

## 2023-06-16 DIAGNOSIS — Z1329 Encounter for screening for other suspected endocrine disorder: Secondary | ICD-10-CM | POA: Diagnosis not present

## 2023-06-16 DIAGNOSIS — Z8673 Personal history of transient ischemic attack (TIA), and cerebral infarction without residual deficits: Secondary | ICD-10-CM | POA: Diagnosis not present

## 2023-06-16 DIAGNOSIS — E1159 Type 2 diabetes mellitus with other circulatory complications: Secondary | ICD-10-CM

## 2023-06-16 DIAGNOSIS — I152 Hypertension secondary to endocrine disorders: Secondary | ICD-10-CM

## 2023-06-16 DIAGNOSIS — E785 Hyperlipidemia, unspecified: Secondary | ICD-10-CM

## 2023-06-16 DIAGNOSIS — Z7985 Long-term (current) use of injectable non-insulin antidiabetic drugs: Secondary | ICD-10-CM

## 2023-06-16 DIAGNOSIS — Z13228 Encounter for screening for other metabolic disorders: Secondary | ICD-10-CM

## 2023-06-16 DIAGNOSIS — Z130 Encounter for screening for diseases of the blood and blood-forming organs and certain disorders involving the immune mechanism: Secondary | ICD-10-CM | POA: Diagnosis not present

## 2023-06-16 LAB — CBC WITH DIFFERENTIAL/PLATELET
Basophils Absolute: 0 10*3/uL (ref 0.0–0.1)
Basophils Relative: 0.5 % (ref 0.0–3.0)
Eosinophils Absolute: 0.1 10*3/uL (ref 0.0–0.7)
Eosinophils Relative: 1.3 % (ref 0.0–5.0)
HCT: 46 % (ref 39.0–52.0)
Hemoglobin: 15.2 g/dL (ref 13.0–17.0)
Lymphocytes Relative: 29.3 % (ref 12.0–46.0)
Lymphs Abs: 2.3 10*3/uL (ref 0.7–4.0)
MCHC: 33 g/dL (ref 30.0–36.0)
MCV: 91 fl (ref 78.0–100.0)
Monocytes Absolute: 0.7 10*3/uL (ref 0.1–1.0)
Monocytes Relative: 8.8 % (ref 3.0–12.0)
Neutro Abs: 4.8 10*3/uL (ref 1.4–7.7)
Neutrophils Relative %: 60.1 % (ref 43.0–77.0)
Platelets: 190 10*3/uL (ref 150.0–400.0)
RBC: 5.06 Mil/uL (ref 4.22–5.81)
RDW: 14.1 % (ref 11.5–15.5)
WBC: 8 10*3/uL (ref 4.0–10.5)

## 2023-06-16 LAB — COMPREHENSIVE METABOLIC PANEL
ALT: 23 U/L (ref 0–53)
AST: 20 U/L (ref 0–37)
Albumin: 4.9 g/dL (ref 3.5–5.2)
Alkaline Phosphatase: 53 U/L (ref 39–117)
BUN: 23 mg/dL (ref 6–23)
CO2: 25 meq/L (ref 19–32)
Calcium: 10.4 mg/dL (ref 8.4–10.5)
Chloride: 101 meq/L (ref 96–112)
Creatinine, Ser: 1.33 mg/dL (ref 0.40–1.50)
GFR: 55.01 mL/min — ABNORMAL LOW (ref >60.00)
Glucose, Bld: 180 mg/dL — ABNORMAL HIGH (ref 70–99)
Potassium: 4.5 meq/L (ref 3.5–5.1)
Sodium: 136 meq/L (ref 135–145)
Total Bilirubin: 0.6 mg/dL (ref 0.2–1.2)
Total Protein: 7.6 g/dL (ref 6.0–8.3)

## 2023-06-16 LAB — MICROALBUMIN / CREATININE URINE RATIO
Creatinine,U: 53.1 mg/dL
Microalb Creat Ratio: 33.7 mg/g — ABNORMAL HIGH (ref 0.0–30.0)
Microalb, Ur: 1.8 mg/dL (ref 0.0–1.9)

## 2023-06-16 LAB — LIPID PANEL
Cholesterol: 99 mg/dL (ref 0–200)
HDL: 36.3 mg/dL — ABNORMAL LOW (ref >39.00)
LDL Cholesterol: 37 mg/dL (ref 0–99)
NonHDL: 62.54
Total CHOL/HDL Ratio: 3
Triglycerides: 126 mg/dL (ref 0.0–149.0)
VLDL: 25.2 mg/dL (ref 0.0–40.0)

## 2023-06-16 LAB — POCT GLYCOSYLATED HEMOGLOBIN (HGB A1C): Hemoglobin A1C: 8.9 % — AB (ref 4.0–5.6)

## 2023-06-16 LAB — PSA: PSA: 2.45 ng/mL (ref 0.10–4.00)

## 2023-06-16 MED ORDER — OZEMPIC (0.25 OR 0.5 MG/DOSE) 2 MG/3ML ~~LOC~~ SOPN: 0.2500 mg | PEN_INJECTOR | SUBCUTANEOUS | 3 refills | Status: DC

## 2023-06-16 NOTE — Progress Notes (Signed)
 Nathaniel Waters 68 y.o.   Chief Complaint  Patient presents with   Annual Exam    Patient here for physical.     HISTORY OF PRESENT ILLNESS: This is a 68 y.o. male here for annual exam and follow-up on chronic medical conditions History of stroke, diabetes and hypertension. Has no complaints or any other medical concerns today.  HPI   Prior to Admission medications   Medication Sig Start Date End Date Taking? Authorizing Provider  aspirin EC 81 MG tablet Take 1 tablet (81 mg total) by mouth daily. Swallow whole. 09/19/22 09/14/23 Yes Lurene Shadow, MD  clopidogrel (PLAVIX) 75 MG tablet TAKE 1 TABLET BY MOUTH EVERY DAY 04/21/23  Yes Marvina Danner, Eilleen Kempf, MD  ezetimibe (ZETIA) 10 MG tablet Take 1 tablet (10 mg total) by mouth daily. 10/18/22  Yes Gael Londo, Eilleen Kempf, MD  FARXIGA 10 MG TABS tablet TAKE 1 TABLET BY MOUTH EVERY DAY 04/21/23  Yes Denesia Donelan, Eilleen Kempf, MD  finasteride (PROSCAR) 5 MG tablet Take 5 mg by mouth daily.   Yes [provider]  glipiZIDE (GLUCOTROL XL) 5 MG 24 hr tablet TAKE 1 TABLET BY MOUTH EVERY DAY WITH BREAKFAST 04/21/23  Yes Fahima Cifelli, Eilleen Kempf, MD  rosuvastatin (CRESTOR) 40 MG tablet Take 1 tablet (40 mg total) by mouth daily. 10/18/22  Yes Kasyn Rolph, Eilleen Kempf, MD  valsartan-hydrochlorothiazide (DIOVAN-HCT) 160-12.5 MG tablet Take 1 tablet by mouth daily. 08/05/22  Yes Georgina Quint, MD    Allergies  Allergen Reactions   Metformin And Related Nausea Only   Jardiance [Empagliflozin] Rash    Patient Active Problem List   Diagnosis Date Noted   Calcific tendonitis of right shoulder 05/13/2023   History of stroke 10/15/2022   Current smoker 11/18/2021   BPH (benign prostatic hyperplasia) 09/27/2021   BPH with obstruction/lower urinary tract symptoms 09/18/2021   Chronic hepatitis C without hepatic coma (HCC) 04/11/2019   Hypertension associated with diabetes (HCC) 04/09/2017   Cigarette nicotine dependence with nicotine-induced disorder  04/09/2017   Dyslipidemia associated with type 2 diabetes mellitus (HCC) 10/23/2014   Dyslipidemia 09/22/2014   Elevated PSA 09/22/2014   GERD 12/30/2007   Type II diabetes mellitus, uncontrolled 12/29/2007   History of colonic polyps 12/29/2007    Past Medical History:  Diagnosis Date   Diabetes mellitus    Diabetes mellitus without complication (HCC)    GERD (gastroesophageal reflux disease)    Hyperlipidemia    Hypertension    Pneumonia     Past Surgical History:  Procedure Laterality Date   CARPAL TUNNEL RELEASE     both    COLONOSCOPY  2018   TRANSURETHRAL RESECTION OF PROSTATE N/A 09/27/2021   Procedure: TRANSURETHRAL RESECTION OF THE PROSTATE (TURP), BIPOLAR;  Surgeon: Jannifer Hick, MD;  Location: [facility];  Service: Urology;  Laterality: N/A;    Social History   Socioeconomic History   Marital status: Divorced    Spouse name: Not on file   Number of children: 4   Years of education: 11   Highest education level: Not on file  Occupational History   Occupation: Loading Dock  Tobacco Use   Smoking status: Former    Current packs/day: 0.00    Average packs/day: 2.0 packs/day for 51.4 years (102.9 ttl pk-yrs)    Types: Cigarettes    Start date: 89    Quit date: 09/16/2022    Years since quitting: 0.7    Passive exposure: Past   Smokeless tobacco: Former    Quit  date: 09/16/2022   Tobacco comments:    Quit smoking 09/2022  Vaping Use   Vaping status: Never Used  Substance and Sexual Activity   Alcohol use: Yes    Alcohol/week: 3.0 standard drinks of alcohol    Types: 1 Cans of beer, 2 Shots of liquor per week    Comment: occasionally on weekend   Drug use: No   Sexual activity: Not Currently  Other Topics Concern   Not on file  Social History Narrative   ** Merged History Encounter **       Fun: Listen to music and be around family and friends. Denies religious beliefs effecting health care.    Social Drivers of Corporate investment banker Strain:  Low Risk  (06/11/2023)   Overall Financial Resource Strain (CARDIA)    Difficulty of Paying Living Expenses: Not hard at all  Food Insecurity: No Food Insecurity (06/11/2023)   Hunger Vital Sign    Worried About Running Out of Food in the Last Year: Never true    Ran Out of Food in the Last Year: Never true  Transportation Needs: No Transportation Needs (06/11/2023)   PRAPARE - Administrator, Civil Service (Medical): No    Lack of Transportation (Non-Medical): No  Physical Activity: Sufficiently Active (06/11/2023)   Exercise Vital Sign    Days of Exercise per Week: 7 days    Minutes of Exercise per Session: 60 min  Stress: No Stress Concern Present (06/11/2023)   [employer] of Occupational Health - Occupational Stress Questionnaire    Feeling of Stress : Not at all  Social Connections: Socially Isolated (06/11/2023)   Social Connection and Isolation Panel [NHANES]    Frequency of Communication with Friends and Family: More than three times a week    Frequency of Social Gatherings with Friends and Family: Once a week    Attends Religious Services: Never    Database administrator or Organizations: No    Attends Banker Meetings: Never    Marital Status: Divorced  Catering manager Violence: Not At Risk (06/11/2023)   Humiliation, Afraid, Rape, and Kick questionnaire    Fear of Current or Ex-Partner: No    Emotionally Abused: No    Physically Abused: No    Sexually Abused: No    Family History  Problem Relation Age of Onset   Breast cancer Mother    Heart disease Brother    Lung cancer Father    Healthy Paternal Grandmother    Cancer Brother    Colon cancer Neg Hx    Esophageal cancer Neg Hx    Stomach cancer Neg Hx    Rectal cancer Neg Hx    Colon polyps Neg Hx      Review of Systems  Constitutional: Negative.  Negative for chills and fever.  HENT: Negative.  Negative for congestion and sore throat.   Respiratory: Negative.  Negative for cough and  shortness of breath.   Cardiovascular: Negative.  Negative for chest pain and palpitations.  Gastrointestinal:  Negative for abdominal pain, diarrhea, nausea and vomiting.  Genitourinary: Negative.  Negative for dysuria and hematuria.  Skin: Negative.  Negative for rash.  Neurological: Negative.  Negative for dizziness and headaches.  All other systems reviewed and are negative.   Vitals:   06/16/23 1021  BP: 120/80  Pulse: 94  Temp: 98.5 F (36.9 C)  SpO2: 95%    Physical Exam Vitals reviewed.  Constitutional:  Appearance: Normal appearance.  HENT:     Head: Normocephalic.     Right Ear: Tympanic membrane, ear canal and external ear normal.     Left Ear: Tympanic membrane, ear canal and external ear normal.     Mouth/Throat:     Mouth: Mucous membranes are moist.     Pharynx: Oropharynx is clear.  Eyes:     Extraocular Movements: Extraocular movements intact.     Conjunctiva/sclera: Conjunctivae normal.     Pupils: Pupils are equal, round, and reactive to light.  Cardiovascular:     Rate and Rhythm: Normal rate and regular rhythm.     Pulses: Normal pulses.     Heart sounds: Normal heart sounds.  Pulmonary:     Effort: Pulmonary effort is normal.     Breath sounds: Normal breath sounds.  Abdominal:     Palpations: Abdomen is soft.     Tenderness: There is no abdominal tenderness.  Musculoskeletal:     Cervical back: No tenderness.  Lymphadenopathy:     Cervical: No cervical adenopathy.  Skin:    General: Skin is warm and dry.     Capillary Refill: Capillary refill takes less than 2 seconds.  Neurological:     General: No focal deficit present.     Mental Status: He is alert and oriented to person, place, and time.  Psychiatric:        Mood and Affect: Mood normal.        Behavior: Behavior normal.    Results for orders placed or performed in visit on 06/16/23 (from the past 24 hours)  POCT HgB A1C     Status: Abnormal   Collection Time: 06/16/23 10:37  AM  Result Value Ref Range   Hemoglobin A1C 8.9 (A) 4.0 - 5.6 %   HbA1c POC (<> result, manual entry)     HbA1c, POC (prediabetic range)     HbA1c, POC (controlled diabetic range)        ASSESSMENT & PLAN: Problem List Items Addressed This Visit       Cardiovascular and Mediastinum   Hypertension associated with diabetes (HCC)   BP Readings from Last 3 Encounters:  06/16/23 120/80  05/11/23 (!) 134/90  11/17/22 130/81  Well-controlled hypertension Continue valsartan HCT 160-12.5 mg daily Lab Results  Component Value Date   HGBA1C 8.9 (A) 06/16/2023  Uncontrolled diabetes.  Taking glipizide 5 mg daily Recommend to start weekly Ozempic and daily Farxiga 10 mg Cardiovascular risks associated with uncontrolled diabetes discussed Diet and nutrition discussed Blood work done today Follow-up in 3 months        Relevant Medications   Semaglutide,0.25 or 0.5MG /DOS, (OZEMPIC, 0.25 OR 0.5 MG/DOSE,) 2 MG/3ML SOPN   Other Relevant Orders   POCT HgB A1C (Completed)   AMB Referral [facility] Care Management   Comprehensive metabolic panel   Microalbumin / creatinine urine ratio     Endocrine   Dyslipidemia associated with type 2 diabetes mellitus (HCC)   Rosuvastatin dose recently increased to 40 mg daily Secondary stroke prevention measures discussed      Relevant Medications   Semaglutide,0.25 or 0.5MG /DOS, (OZEMPIC, 0.25 OR 0.5 MG/DOSE,) 2 MG/3ML SOPN   Other Relevant Orders   POCT HgB A1C (Completed)   AMB Referral VBCI Care Management   Lipid panel   Microalbumin / creatinine urine ratio     Other   History of stroke   Clinically stable. Secondary stroke prevention measures discussed Diet and nutrition discussed Importance of blood pressure,  diabetes, and cholesterol control addressed Continues on daily baby aspirin and Plavix 75 mg daily Has follow-up appointment with neurologist next month      Other Visit Diagnoses       Encounter for general adult medical  examination with abnormal findings    -  Primary   Relevant Orders   CBC with Differential/Platelet   Comprehensive metabolic panel   PSA   Lipid panel     Screening for deficiency anemia       Relevant Orders   CBC with Differential/Platelet     Screening for endocrine, metabolic and immunity disorder       Relevant Orders   Comprehensive metabolic panel     Screening for prostate cancer       Relevant Orders   PSA      Modifiable risk factors discussed with patient. Anticipatory guidance according to age provided. The following topics were also discussed: Social Determinants of Health Smoking.  No longer smoking Diet and nutrition and need to decrease amount of daily carbohydrate intake and daily calories and increase amount of plant based protein in his diet Benefits of exercise Cancer screening and review of most recent colonoscopy report Vaccinations review and recommendations Cardiovascular risk assessment Review of multiple chronic medical conditions Review of all medications and changes made Mental health including depression and anxiety Fall and accident prevention  Patient Instructions  Health Maintenance, Male Adopting a healthy lifestyle and getting preventive care are important in promoting health and wellness. Ask your health care provider about: The right schedule for you to have regular tests and exams. Things you can do on your own to prevent diseases and keep yourself healthy. What should I know about diet, weight, and exercise? Eat a healthy diet  Eat a diet that includes plenty of vegetables, fruits, low-fat dairy products, and lean protein. Do not eat a lot of foods that are high in solid fats, added sugars, or sodium. Maintain a healthy weight Body mass index (BMI) is a measurement that can be used to identify possible weight problems. It estimates body fat based on height and weight. Your health care provider can help determine your BMI and help you  achieve or maintain a healthy weight. Get regular exercise Get regular exercise. This is one of the most important things you can do for your health. Most adults should: Exercise for at least 150 minutes each week. The exercise should increase your heart rate and make you sweat (moderate-intensity exercise). Do strengthening exercises at least twice a week. This is in addition to the moderate-intensity exercise. Spend less time sitting. Even light physical activity can be beneficial. Watch cholesterol and blood lipids Have your blood tested for lipids and cholesterol at 69 years of age, then have this test every 5 years. You may need to have your cholesterol levels checked more often if: Your lipid or cholesterol levels are high. You are older than 69 years of age. You are at high risk for heart disease. What should I know about cancer screening? Many types of cancers can be detected early and may often be prevented. Depending on your health history and family history, you may need to have cancer screening at various ages. This may include screening for: Colorectal cancer. Prostate cancer. Skin cancer. Lung cancer. What should I know about heart disease, diabetes, and high blood pressure? Blood pressure and heart disease High blood pressure causes heart disease and increases the risk of stroke. This is more likely to develop  in people who have high blood pressure readings or are overweight. Talk with your health care provider about your target blood pressure readings. Have your blood pressure checked: Every 3-5 years if you are 64-1 years of age. Every year if you are 61 years old or older. If you are between the ages of 97 and 38 and are a current or former smoker, ask your health care provider if you should have a one-time screening for abdominal aortic aneurysm (AAA). Diabetes Have regular diabetes screenings. This checks your fasting blood sugar level. Have the screening done: Once  every three years after age 49 if you are at a normal weight and have a low risk for diabetes. More often and at a younger age if you are overweight or have a high risk for diabetes. What should I know about preventing infection? Hepatitis B If you have a higher risk for hepatitis B, you should be screened for this virus. Talk with your health care provider to find out if you are at risk for hepatitis B infection. Hepatitis C Blood testing is recommended for: Everyone born from [birth year] through 1966. Anyone with known risk factors for hepatitis C. Sexually transmitted infections (STIs) You should be screened each year for STIs, including gonorrhea and chlamydia, if: You are sexually active and are younger than 69 years of age. You are older than 69 years of age and your health care provider tells you that you are at risk for this type of infection. Your sexual activity has changed since you were last screened, and you are at increased risk for chlamydia or gonorrhea. Ask your health care provider if you are at risk. Ask your health care provider about whether you are at high risk for HIV. Your health care provider may recommend a prescription medicine to help prevent HIV infection. If you choose to take medicine to prevent HIV, you should first get tested for HIV. You should then be tested every 3 months for as long as you are taking the medicine. Follow these instructions at home: Alcohol use Do not drink alcohol if your health care provider tells you not to drink. If you drink alcohol: Limit how much you have to 0-2 drinks a day. Know how much alcohol is in your drink. In the [place] one drink equals one 12 oz bottle of beer (355 mL), one 5 oz glass of wine (148 mL), or one 1 oz glass of hard liquor (44 mL). Lifestyle Do not use any products that contain nicotine or tobacco. These products include cigarettes, chewing tobacco, and vaping devices, such as e-cigarettes. If you need help quitting, ask  your health care provider. Do not use street drugs. Do not share needles. Ask your health care provider for help if you need support or information about quitting drugs. General instructions Schedule regular health, dental, and eye exams. Stay current with your vaccines. Tell your health care provider if: You often feel depressed. You have ever been abused or do not feel safe at home. Summary Adopting a healthy lifestyle and getting preventive care are important in promoting health and wellness. Follow your health care provider's instructions about healthy diet, exercising, and getting tested or screened for diseases. Follow your health care provider's instructions on monitoring your cholesterol and blood pressure. This information is not intended to replace advice given to you by your health care provider. Make sure you discuss any questions you have with your health care provider. Document Revised: 08/13/2020 Document Reviewed: 08/13/2020 Elsevier Patient  Education  2024 Elsevier Inc.    Edwina Barth, MD [facility], [facility], [facility]

## 2023-06-16 NOTE — Assessment & Plan Note (Signed)
Rosuvastatin dose recently increased to 40 mg daily Secondary stroke prevention measures discussed

## 2023-06-16 NOTE — Assessment & Plan Note (Signed)
 BP Readings from Last 3 Encounters:  06/16/23 120/80  05/11/23 (!) 134/90  11/17/22 130/81  Well-controlled hypertension Continue valsartan HCT 160-12.5 mg daily Lab Results  Component Value Date   HGBA1C 8.9 (A) 06/16/2023  Uncontrolled diabetes.  Taking glipizide 5 mg daily Recommend to start weekly Ozempic and daily Farxiga 10 mg Cardiovascular risks associated with uncontrolled diabetes discussed Diet and nutrition discussed Blood work done today Follow-up in 3 months

## 2023-06-16 NOTE — Patient Instructions (Signed)
 Health Maintenance, Male  Adopting a healthy lifestyle and getting preventive care are important in promoting health and wellness. Ask your health care provider about:  The right schedule for you to have regular tests and exams.  Things you can do on your own to prevent diseases and keep yourself healthy.  What should I know about diet, weight, and exercise?  Eat a healthy diet    Eat a diet that includes plenty of vegetables, fruits, low-fat dairy products, and lean protein.  Do not eat a lot of foods that are high in solid fats, added sugars, or sodium.  Maintain a healthy weight  Body mass index (BMI) is a measurement that can be used to identify possible weight problems. It estimates body fat based on height and weight. Your health care provider can help determine your BMI and help you achieve or maintain a healthy weight.  Get regular exercise  Get regular exercise. This is one of the most important things you can do for your health. Most adults should:  Exercise for at least 150 minutes each week. The exercise should increase your heart rate and make you sweat (moderate-intensity exercise).  Do strengthening exercises at least twice a week. This is in addition to the moderate-intensity exercise.  Spend less time sitting. Even light physical activity can be beneficial.  Watch cholesterol and blood lipids  Have your blood tested for lipids and cholesterol at 69 years of age, then have this test every 5 years.  You may need to have your cholesterol levels checked more often if:  Your lipid or cholesterol levels are high.  You are older than 69 years of age.  You are at high risk for heart disease.  What should I know about cancer screening?  Many types of cancers can be detected early and may often be prevented. Depending on your health history and family history, you may need to have cancer screening at various ages. This may include screening for:  Colorectal cancer.  Prostate cancer.  Skin cancer.  Lung  cancer.  What should I know about heart disease, diabetes, and high blood pressure?  Blood pressure and heart disease  High blood pressure causes heart disease and increases the risk of stroke. This is more likely to develop in people who have high blood pressure readings or are overweight.  Talk with your health care provider about your target blood pressure readings.  Have your blood pressure checked:  Every 3-5 years if you are 9-95 years of age.  Every year if you are 85 years old or older.  If you are between the ages of 29 and 29 and are a current or former smoker, ask your health care provider if you should have a one-time screening for abdominal aortic aneurysm (AAA).  Diabetes  Have regular diabetes screenings. This checks your fasting blood sugar level. Have the screening done:  Once every three years after age 23 if you are at a normal weight and have a low risk for diabetes.  More often and at a younger age if you are overweight or have a high risk for diabetes.  What should I know about preventing infection?  Hepatitis B  If you have a higher risk for hepatitis B, you should be screened for this virus. Talk with your health care provider to find out if you are at risk for hepatitis B infection.  Hepatitis C  Blood testing is recommended for:  Everyone born from [birth year] through 1966.  Anyone  with known risk factors for hepatitis C.  Sexually transmitted infections (STIs)  You should be screened each year for STIs, including gonorrhea and chlamydia, if:  You are sexually active and are younger than 69 years of age.  You are older than 69 years of age and your health care provider tells you that you are at risk for this type of infection.  Your sexual activity has changed since you were last screened, and you are at increased risk for chlamydia or gonorrhea. Ask your health care provider if you are at risk.  Ask your health care provider about whether you are at high risk for HIV. Your health care provider  may recommend a prescription medicine to help prevent HIV infection. If you choose to take medicine to prevent HIV, you should first get tested for HIV. You should then be tested every 3 months for as long as you are taking the medicine.  Follow these instructions at home:  Alcohol use  Do not drink alcohol if your health care provider tells you not to drink.  If you drink alcohol:  Limit how much you have to 0-2 drinks a day.  Know how much alcohol is in your drink. In the [place] one drink equals one 12 oz bottle of beer (355 mL), one 5 oz glass of wine (148 mL), or one 1 oz glass of hard liquor (44 mL).  Lifestyle  Do not use any products that contain nicotine or tobacco. These products include cigarettes, chewing tobacco, and vaping devices, such as e-cigarettes. If you need help quitting, ask your health care provider.  Do not use street drugs.  Do not share needles.  Ask your health care provider for help if you need support or information about quitting drugs.  General instructions  Schedule regular health, dental, and eye exams.  Stay current with your vaccines.  Tell your health care provider if:  You often feel depressed.  You have ever been abused or do not feel safe at home.  Summary  Adopting a healthy lifestyle and getting preventive care are important in promoting health and wellness.  Follow your health care provider's instructions about healthy diet, exercising, and getting tested or screened for diseases.  Follow your health care provider's instructions on monitoring your cholesterol and blood pressure.  This information is not intended to replace advice given to you by your health care provider. Make sure you discuss any questions you have with your health care provider.  Document Revised: 08/13/2020 Document Reviewed: 08/13/2020  Elsevier Patient Education  2024 [employer].

## 2023-06-16 NOTE — Assessment & Plan Note (Signed)
Clinically stable. Secondary stroke prevention measures discussed Diet and nutrition discussed Importance of blood pressure, diabetes, and cholesterol control addressed Continues on daily baby aspirin and Plavix 75 mg daily Has follow-up appointment with neurologist next month

## 2023-06-17 ENCOUNTER — Telehealth: Payer: Self-pay | Admitting: *Deleted

## 2023-06-17 ENCOUNTER — Other Ambulatory Visit: Payer: Self-pay | Admitting: Radiology

## 2023-06-17 ENCOUNTER — Telehealth: Payer: Self-pay | Admitting: Emergency Medicine

## 2023-06-17 DIAGNOSIS — Z860100 Personal history of colon polyps, unspecified: Secondary | ICD-10-CM

## 2023-06-17 NOTE — Progress Notes (Signed)
 Care Guide Pharmacy Note  06/17/2023 Name: Nathaniel Waters MRN: [id number] DOB: Dec 08, 1955  Referred By: Georgina Quint, MD Reason for referral: Care Coordination (Outreach to schedule referral with pharmacist )   Nathaniel Waters is a 68 y.o. year old male who is a primary care patient of Alvy Bimler, Eilleen Kempf, MD.  Nathaniel Waters was referred to the pharmacist for assistance related to: DMII  Successful contact was made with the patient to discuss pharmacy services.  Patient declines engagement at this time. Contact information was provided to the patient should they wish to reach out for assistance at a later time.  Burman Nieves, CMA, Care Guide [facility]  [facility], [facility] Guide Direct Dial: [phone]  Fax: [phone] Website: [email or web address]

## 2023-06-17 NOTE — Telephone Encounter (Signed)
 Copied from CRM [id number]. Topic: General - Other >> Jun 17, 2023  2:37 PM Almira Coaster wrote: Reason for CRM: Patient is calling because he is having possible dental procedures (cleaning, bridge work, possible implant) and prior to doing anything they need a clearance from Dr.Sagardia. Patient's dentis is [employer] [phone]. He was seen by Dr.Sagardia yesterday and forgot to mention it.

## 2023-06-17 NOTE — Progress Notes (Signed)
 Place referral for colonoscopy as requested.  Thanks.

## 2023-06-19 NOTE — Telephone Encounter (Signed)
 Spoke with patient and he understood Not for regular dental cleaning. Only invasive procedures.

## 2023-06-19 NOTE — Telephone Encounter (Signed)
 Patient's diabetes is not controlled.  He also has a history of a stroke in the past and is taking aspirin and Plavix which will need to be discontinued 5 to 7 days prior to any dental surgery.  In my opinion he is not ready for clearance yet.  Thanks.

## 2023-06-19 NOTE — Telephone Encounter (Signed)
 Not for regular dental cleaning. Only invasive procedures.

## 2023-06-22 NOTE — Telephone Encounter (Signed)
 Copied from CRM [id number]. Topic: General - Other >> Jun 22, 2023  8:33 AM Pascal Lux wrote: Reason for CRM: Chastity from [facility] stated she's calling to see if patient is able to make it to his dental cleaning appointment tomorrow since he recently had a mild stroke. Stated they will need a clearance form sent to their office before procedure tomorrow. -  Fax: [phone]

## 2023-06-30 ENCOUNTER — Telehealth: Payer: Self-pay

## 2023-06-30 NOTE — Telephone Encounter (Signed)
 Patient was identified as falling into the [place] Measure - Diabetes.   Patient was: Appointment scheduled for lab or office visit for A1c. Patient is on a 6 month schedule, A1c due in September. Has an appt scheduled

## 2023-07-07 ENCOUNTER — Other Ambulatory Visit: Payer: Self-pay | Admitting: Emergency Medicine

## 2023-07-07 DIAGNOSIS — E1159 Type 2 diabetes mellitus with other circulatory complications: Secondary | ICD-10-CM

## 2023-07-29 DIAGNOSIS — R350 Frequency of micturition: Secondary | ICD-10-CM | POA: Diagnosis not present

## 2023-07-31 ENCOUNTER — Telehealth: Payer: Self-pay | Admitting: Pharmacy Technician

## 2023-07-31 NOTE — Progress Notes (Signed)
   07/31/2023  Patient ID: Nathaniel Waters, male   DOB: 1955-12-31, 68 y.o.   MRN: [id number]  Patient engaged with clinical pharmacist for management of diabetes on 05/27/2023. Outreach by [employer] technician was requested.   Outreached patient to discuss diabetes medication management. Left voicemail for patient to return my call at their convenience.    Liara Holm, CPhT [place]  Office: [phone] Fax: [phone] Email: [email or web address]

## 2023-08-07 ENCOUNTER — Telehealth: Payer: Self-pay | Admitting: Pharmacy Technician

## 2023-08-07 NOTE — Progress Notes (Signed)
   08/07/2023  Patient ID: Nathaniel Waters, male   DOB: 1956-03-11, 68 y.o.   MRN: [id number]  Patient engaged with clinical pharmacist for management of diabetes on 05/27/2023. Outreach by [employer] technician was requested.   Outreached patient to discuss diabetes medication management. Left voicemail for patient to return my call at their convenience.   Worth Headland, CPhT [place]  Office: [phone] Fax: [phone] Email: [email or web address]

## 2023-08-09 NOTE — Progress Notes (Unsigned)
 se     Brigitte Canard, PA-C [address] [place], [place]  [address] Phone: [phone]   Primary Care Physician: Elvira Hammersmith, MD  Primary Gastroenterologist:  Brigitte Canard, PA-C / Dr. Laurell Pond   Chief Complaint:  Repeat Colonoscopy; On Plavix        HPI:   Nathaniel Waters is a 68 y.o. male discussed repeat colonoscopy.  He has history of multiple adenomatous colon polyps.  Has had several cold.  08/2020 last colonoscopy by Dr. Bridgett Camps: 5 small (2 mm to 5 mm) tubular adenoma polyps removed.  No dysplasia.  Good prep.  Sigmoid diverticulosis.  3-year repeat.  PMH: History of CVA, current smoker, BPH, chronic hepatitis C, dyslipidemia, GERD, diabetes, currently on aspirin  and Plavix .  Echocardiogram 09/2022 normal LVEF 60 to 65%.    Current Outpatient Medications  Medication Sig Dispense Refill   aspirin  EC 81 MG tablet Take 1 tablet (81 mg total) by mouth daily. Swallow whole.     clopidogrel  (PLAVIX ) 75 MG tablet TAKE 1 TABLET BY MOUTH EVERY DAY 90 tablet 1   ezetimibe  (ZETIA ) 10 MG tablet Take 1 tablet (10 mg total) by mouth daily. 90 tablet 3   FARXIGA  10 MG TABS tablet TAKE 1 TABLET BY MOUTH EVERY DAY 90 tablet 1   finasteride (PROSCAR) 5 MG tablet Take 5 mg by mouth daily.     glipiZIDE  (GLUCOTROL  XL) 5 MG 24 hr tablet TAKE 1 TABLET BY MOUTH EVERY DAY WITH BREAKFAST 90 tablet 1   rosuvastatin  (CRESTOR ) 40 MG tablet Take 1 tablet (40 mg total) by mouth daily. 90 tablet 3   Semaglutide ,0.25 or 0.5MG /DOS, (OZEMPIC , 0.25 OR 0.5 MG/DOSE,) 2 MG/3ML SOPN Inject 0.25 mg into the skin once a week. Increase dose to 0.5 mg after 2 weeks if side effects tolerated 3 mL 3   valsartan -hydrochlorothiazide  (DIOVAN -HCT) 160-12.5 MG tablet TAKE 1 TABLET BY MOUTH EVERY DAY 90 tablet 3   No current facility-administered medications for this visit.    Allergies as of 08/11/2023 - Review Complete 06/16/2023  Allergen Reaction Noted   Metformin and related Nausea Only 04/20/2017    Jardiance  [empagliflozin ] Rash 12/28/2016    Past Medical History:  Diagnosis Date   Diabetes mellitus    Diabetes mellitus without complication (HCC)    GERD (gastroesophageal reflux disease)    Hyperlipidemia    Hypertension    Pneumonia     Past Surgical History:  Procedure Laterality Date   CARPAL TUNNEL RELEASE     both    COLONOSCOPY  2018   TRANSURETHRAL RESECTION OF PROSTATE N/A 09/27/2021   Procedure: TRANSURETHRAL RESECTION OF THE PROSTATE (TURP), BIPOLAR;  Surgeon: Lahoma Pigg, MD;  Location: [facility];  Service: Urology;  Laterality: N/A;    Review of Systems:    All systems reviewed and negative except where noted in HPI.    Physical Exam:  There were no vitals taken for this visit. No LMP for male patient.  General: Well-nourished, well-developed in no acute distress.  Lungs: Clear to auscultation bilaterally. Non-labored. Heart: Regular rate and rhythm, no murmurs rubs or gallops.  Abdomen: Bowel sounds are normal; Abdomen is Soft; No hepatosplenomegaly, masses or hernias;  No Abdominal Tenderness; No guarding or rebound tenderness. Neuro: Alert and oriented x 3.  Grossly intact.  Psych: Alert and cooperative, normal mood and affect.   Imaging Studies: No results found.  Labs: CBC    Component Value Date/Time   WBC 8.0 06/16/2023 1120  RBC 5.06 06/16/2023 1120   HGB 15.2 06/16/2023 1120   HCT 46.0 06/16/2023 1120   PLT 190.0 06/16/2023 1120   MCV 91.0 06/16/2023 1120   MCH 30.2 05/11/2023 0643   MCHC 33.0 06/16/2023 1120   RDW 14.1 06/16/2023 1120   LYMPHSABS 2.3 06/16/2023 1120   MONOABS 0.7 06/16/2023 1120   EOSABS 0.1 06/16/2023 1120   BASOSABS 0.0 06/16/2023 1120    CMP     Component Value Date/Time   NA 136 06/16/2023 1120   K 4.5 06/16/2023 1120   CL 101 06/16/2023 1120   CO2 25 06/16/2023 1120   GLUCOSE 180 (H) 06/16/2023 1120   BUN 23 06/16/2023 1120   CREATININE 1.33 06/16/2023 1120   CREATININE 1.06 01/10/2020 1121    CALCIUM  10.4 06/16/2023 1120   PROT 7.6 06/16/2023 1120   ALBUMIN 4.9 06/16/2023 1120   AST 20 06/16/2023 1120   ALT 23 06/16/2023 1120   ALT 39 04/11/2019 1615   ALKPHOS 53 06/16/2023 1120   BILITOT 0.6 06/16/2023 1120   GFRNONAA 54 (L) 05/11/2023 0643   GFRAA >60 02/21/2018 1547       Assessment and Plan:   Nathaniel Waters is a 68 y.o. y/o male ***    Brigitte Canard, PA-C  Follow up ***

## 2023-08-10 ENCOUNTER — Telehealth: Payer: Self-pay | Admitting: Pharmacy Technician

## 2023-08-10 NOTE — Progress Notes (Signed)
   08/10/2023  Patient ID: Nathaniel Waters, male   DOB: [birth year], 68 y.o.   MRN: [id number]  Patient engaged with clinical pharmacist for management of diabetes on 05/27/2023. Outreach by [employer] technician was requested.   Outreached patient to discuss diabetes medication management. Left voicemail for patient to return my call at their convenience.    Jazlynne Milliner, CPhT [place]  Office: [phone] Fax: [phone] Email: [email or web address]

## 2023-08-11 ENCOUNTER — Ambulatory Visit: Admitting: Physician Assistant

## 2023-08-11 ENCOUNTER — Telehealth: Payer: Self-pay

## 2023-08-11 ENCOUNTER — Encounter: Payer: Self-pay | Admitting: Physician Assistant

## 2023-08-11 VITALS — BP 116/60 | HR 88 | Ht 70.5 in | Wt 228.4 lb

## 2023-08-11 DIAGNOSIS — Z7982 Long term (current) use of aspirin: Secondary | ICD-10-CM | POA: Diagnosis not present

## 2023-08-11 DIAGNOSIS — Z7901 Long term (current) use of anticoagulants: Secondary | ICD-10-CM

## 2023-08-11 DIAGNOSIS — Z7189 Other specified counseling: Secondary | ICD-10-CM | POA: Diagnosis not present

## 2023-08-11 DIAGNOSIS — Z8673 Personal history of transient ischemic attack (TIA), and cerebral infarction without residual deficits: Secondary | ICD-10-CM

## 2023-08-11 DIAGNOSIS — Z860100 Personal history of colon polyps, unspecified: Secondary | ICD-10-CM | POA: Diagnosis not present

## 2023-08-11 MED ORDER — NA SULFATE-K SULFATE-MG SULF 17.5-3.13-1.6 GM/177ML PO SOLN: 1.0000 | Freq: Once | ORAL | 0 refills | Status: AC

## 2023-08-11 NOTE — Patient Instructions (Signed)
 You will be contacted by our office prior to your procedure for directions on holding your Plavix .  If you do not hear from our office 1 week prior to your scheduled procedure, please call [phone] to discuss.   You have been scheduled for a colonoscopy. Please follow written instructions given to you at your visit today.   If you use inhalers (even only as needed), please bring them with you on the day of your procedure.  DO NOT TAKE 7 DAYS PRIOR TO TEST- Trulicity  (dulaglutide ) Ozempic , Wegovy (semaglutide ) Mounjaro (tirzepatide) Bydureon Bcise (exanatide extended release)  DO NOT TAKE 1 DAY PRIOR TO YOUR TEST Rybelsus (semaglutide ) Adlyxin (lixisenatide) Victoza (liraglutide) Byetta (exanatide) ___________________________________________________________________________  Please follow up sooner if symptoms increase or worsen   Due to recent changes in healthcare laws, you may see the results of your imaging and laboratory studies on MyChart before your provider has had a chance to review them.  We understand that in some cases there may be results that are confusing or concerning to you. Not all laboratory results come back in the same time frame and the provider may be waiting for multiple results in order to interpret others.  Please give us  48 hours in order for your provider to thoroughly review all the results before contacting the office for clarification of your results.   _______________________________________________________  If your blood pressure at your visit was 140/90 or greater, please contact your primary care physician to follow up on this.  _______________________________________________________  If you are age 22 or older, your body mass index should be between 23-30. Your Body mass index is 32.31 kg/m. If this is out of the aforementioned range listed, please consider follow up with your Primary Care Provider.  If you are age 75 or younger, your body mass  index should be between 19-25. Your Body mass index is 32.31 kg/m. If this is out of the aformentioned range listed, please consider follow up with your Primary Care Provider.   ________________________________________________________  The [facility] providers would like to encourage you to use MYCHART to communicate with providers for non-urgent requests or questions.  Due to long hold times on the telephone, sending your provider a message by [facility] may be a faster and more efficient way to get a response.  Please allow 48 business hours for a response.  Please remember that this is for non-urgent requests.  _______________________________________________________ Thank you for trusting me with your gastrointestinal care!   Brigitte Canard, PA

## 2023-08-11 NOTE — Telephone Encounter (Signed)
  Nation Kackley [birth year] [id number]  08/11/23   Dear Elvira Hammersmith, MD:  We have scheduled the above named patient for a(n) Colonoscopy procedure. Our records show that (s)he is on anticoagulation therapy.  Please advise as to whether the patient may come off their therapy of Plavix  5 days prior to their procedure which is scheduled for 09/23/23.  Please route your response to Lianne Redo, CMA or fax response to [phone].  Sincerely,    [facility]

## 2023-08-11 NOTE — Telephone Encounter (Signed)
 Yes, he can

## 2023-08-13 NOTE — Telephone Encounter (Signed)
 I spoke with the patient and informed him that he can hold his Plavix  5 days prior to his procedure. He is aware and understands and will give me a call if he has any questions or concerns.

## 2023-08-17 NOTE — Progress Notes (Signed)
 Addendum: Reviewed and agree with assessment and management plan. Asha Grumbine, Carie Caddy, MD

## 2023-08-26 ENCOUNTER — Ambulatory Visit (INDEPENDENT_AMBULATORY_CARE_PROVIDER_SITE_OTHER)

## 2023-08-26 ENCOUNTER — Encounter (HOSPITAL_COMMUNITY): Payer: Self-pay | Admitting: Emergency Medicine

## 2023-08-26 ENCOUNTER — Ambulatory Visit (HOSPITAL_COMMUNITY)
Admission: EM | Admit: 2023-08-26 | Discharge: 2023-08-26 | Disposition: A | Discharge status: Home / Self Care | Attending: Family Medicine | Admitting: Family Medicine

## 2023-08-26 DIAGNOSIS — M7731 Calcaneal spur, right foot: Secondary | ICD-10-CM | POA: Diagnosis not present

## 2023-08-26 DIAGNOSIS — M19071 Primary osteoarthritis, right ankle and foot: Secondary | ICD-10-CM | POA: Diagnosis not present

## 2023-08-26 DIAGNOSIS — M79671 Pain in right foot: Secondary | ICD-10-CM

## 2023-08-26 MED ORDER — PREDNISONE 20 MG PO TABS: 40.0000 mg | ORAL_TABLET | Freq: Every day | ORAL | 0 refills | Status: DC

## 2023-08-26 NOTE — ED Triage Notes (Addendum)
 Pt c/o right medial foot pain at base of right big toe for week or so. Denies known injury. Painful with touching or weight bearing.

## 2023-08-26 NOTE — Discharge Instructions (Signed)
 Be aware, your blood sugars will run higher than normal while taking Rx prednisone . Monitor closely.

## 2023-08-26 NOTE — ED Provider Notes (Signed)
 [facility]   [id number] 08/26/23 Arrival Time: 1103  ASSESSMENT & PLAN:  1. Right foot pain    No signs of infection; likely related to shoe pressure over area. Will use OTC moleskin or something similar to build up donut around area to keep pressure off area. Trial of: Meds ordered this encounter  Medications   predniSONE  (DELTASONE ) 20 MG tablet    Sig: Take 2 tablets (40 mg total) by mouth daily.    Dispense:  10 tablet    Refill:  0   I have personally viewed and independently interpreted the imaging studies ordered this visit. R foot: no acute bony changes.  Work/school excuse note: not needed. Recommend:  Follow-up Information     Schedule an appointment as soon as possible for a visit  with [facility]).   Contact information: [address] [place],  [place]  [address]  [phone]               OTC analgesics as needed.  Reviewed expectations re: course of current medical issues. Questions answered. Outlined signs and symptoms indicating need for more acute intervention. Patient verbalized understanding. After Visit Summary given.  SUBJECTIVE: History from: patient. Nathaniel Waters is a 68 y.o. male who reports right medial foot pain at base of right big toe for week or so. Denies known injury. Painful with touching or weight bearing. Has been walking more than usual. Denies extremity sensation changes or weakness.   Past Surgical History:  Procedure Laterality Date   CARPAL TUNNEL RELEASE     both    COLONOSCOPY  2018   TRANSURETHRAL RESECTION OF PROSTATE N/A 09/27/2021   Procedure: TRANSURETHRAL RESECTION OF THE PROSTATE (TURP), BIPOLAR;  Surgeon: Lahoma Pigg, MD;  Location: [facility];  Service: Urology;  Laterality: N/A;      OBJECTIVE:  Vitals:   08/26/23 1136  BP: 112/75  Pulse: 93  Resp: 17  Temp: 99.1 F (37.3 C)  TempSrc: Oral  SpO2: 92%    General appearance: alert; no distress HEENT: [place];  AT Neck: supple with FROM Resp: unlabored respirations Extremities: RLE: warm with well perfused appearance; sub-cm area of circular erythema at lateral distal 1st metacarpal; TTP CV: brisk extremity capillary refill of RLE; 2+ DP pulse of RLE. Skin: warm and dry; no visible rashes Neurologic: gait normal; normal sensation and strength of RLE Psychological: alert and cooperative; normal mood and affect  Imaging: DG Foot Complete Right Result Date: 08/26/2023 CLINICAL DATA:  pain x one week; more distally. EXAM: RIGHT FOOT COMPLETE - 3+ VIEW COMPARISON:  None Available. FINDINGS: No acute fracture or dislocation. No aggressive osseous lesion. Mild diffuse arthritis of imaged joints. No periarticular osteopenia or bony erosions. No periarticular soft tissue calcifications. Calcaneal spur noted along the Plantar aponeurosis attachment site. No focal soft tissue swelling. No radiopaque foreign bodies. IMPRESSION: No acute osseous abnormality of the right foot. Electronically Signed   By: Beula Brunswick M.D.   On: 08/26/2023 11:56      Allergies  Allergen Reactions   Metformin And Related Nausea Only   Jardiance  [Empagliflozin ] Rash    Past Medical History:  Diagnosis Date   Diabetes mellitus    Diabetes mellitus without complication (HCC)    GERD (gastroesophageal reflux disease)    Hyperlipidemia    Hypertension    Pneumonia    Stroke [facility])    Social History   Socioeconomic History   Marital status: Divorced  Spouse name: Not on file   Number of children: 4   Years of education: 93   Highest education level: Not on file  Occupational History   Occupation: Loading [employer]  Tobacco Use   Smoking status: Former    Current packs/day: 0.00    Average packs/day: 2.0 packs/day for 51.4 years (102.9 ttl pk-yrs)    Types: Cigarettes    Start date: 67    Quit date: 09/16/2022    Years since quitting: 0.9    Passive exposure: Past   Smokeless tobacco: Former    Quit date:  09/16/2022   Tobacco comments:    Quit smoking 09/2022  Vaping Use   Vaping status: Never Used  Substance and Sexual Activity   Alcohol use: Yes    Alcohol/week: 3.0 standard drinks of alcohol    Types: 1 Cans of beer, 2 Shots of liquor per week    Comment: occasionally on weekend   Drug use: No   Sexual activity: Not Currently  Other Topics Concern   Not on file  Social History Narrative   ** Merged History Encounter **       Fun: Listen to music and be around family and friends. Denies religious beliefs effecting health care.    Social Drivers of Corporate investment banker Strain: Low Risk  (06/11/2023)   Overall Financial Resource Strain (CARDIA)    Difficulty of Paying Living Expenses: Not hard at all  Food Insecurity: No Food Insecurity (06/11/2023)   Hunger Vital Sign    Worried About Running Out of Food in the Last Year: Never true    Ran Out of Food in the Last Year: Never true  Transportation Needs: No Transportation Needs (06/11/2023)   PRAPARE - Administrator, Civil Service (Medical): No    Lack of Transportation (Non-Medical): No  Physical Activity: Sufficiently Active (06/11/2023)   Exercise Vital Sign    Days of Exercise per Week: 7 days    Minutes of Exercise per Session: 60 min  Stress: No Stress Concern Present (06/11/2023)   [employer] of Occupational Health - Occupational Stress Questionnaire    Feeling of Stress : Not at all  Social Connections: Socially Isolated (06/11/2023)   Social Connection and Isolation Panel [NHANES]    Frequency of Communication with Friends and Family: More than three times a week    Frequency of Social Gatherings with Friends and Family: Once a week    Attends Religious Services: Never    Database administrator or Organizations: No    Attends Engineer, structural: Never    Marital Status: Divorced   Family History  Problem Relation Age of Onset   Breast cancer Mother    Heart disease Brother    Lung  cancer Father    Healthy Paternal Grandmother    Cancer Brother    Colon cancer Neg Hx    Esophageal cancer Neg Hx    Stomach cancer Neg Hx    Rectal cancer Neg Hx    Colon polyps Neg Hx    Past Surgical History:  Procedure Laterality Date   CARPAL TUNNEL RELEASE     both    COLONOSCOPY  2018   TRANSURETHRAL RESECTION OF PROSTATE N/A 09/27/2021   Procedure: TRANSURETHRAL RESECTION OF THE PROSTATE (TURP), BIPOLAR;  Surgeon: Lahoma Pigg, MD;  Location: [facility];  Service: Urology;  Laterality: N/AAfton Albright, MD 08/26/23 1406

## 2023-09-01 ENCOUNTER — Telehealth: Payer: Self-pay | Admitting: Emergency Medicine

## 2023-09-01 ENCOUNTER — Encounter: Payer: Self-pay | Admitting: Emergency Medicine

## 2023-09-01 NOTE — Telephone Encounter (Signed)
 error

## 2023-09-01 NOTE — Telephone Encounter (Signed)
 Patient dropped off document Surgical Clearance, to be filled out by provider. Patient requested to send it back via Fax within 7-days. Document is located in providers tray at front office.Please advise at Mobile [phone] (mobile)   Fax number provided on form.

## 2023-09-02 NOTE — Telephone Encounter (Signed)
 Placed in provider office to be reviewed and completed.

## 2023-09-08 ENCOUNTER — Other Ambulatory Visit (INDEPENDENT_AMBULATORY_CARE_PROVIDER_SITE_OTHER): Admitting: Pharmacist

## 2023-09-08 ENCOUNTER — Ambulatory Visit (INDEPENDENT_AMBULATORY_CARE_PROVIDER_SITE_OTHER): Admitting: Emergency Medicine

## 2023-09-08 ENCOUNTER — Encounter: Payer: Self-pay | Admitting: Emergency Medicine

## 2023-09-08 VITALS — BP 108/72 | HR 104 | Temp 98.3°F | Ht 70.5 in | Wt 221.0 lb

## 2023-09-08 DIAGNOSIS — E1159 Type 2 diabetes mellitus with other circulatory complications: Secondary | ICD-10-CM

## 2023-09-08 DIAGNOSIS — Z8673 Personal history of transient ischemic attack (TIA), and cerebral infarction without residual deficits: Secondary | ICD-10-CM

## 2023-09-08 DIAGNOSIS — Z7984 Long term (current) use of oral hypoglycemic drugs: Secondary | ICD-10-CM | POA: Diagnosis not present

## 2023-09-08 DIAGNOSIS — E785 Hyperlipidemia, unspecified: Secondary | ICD-10-CM

## 2023-09-08 DIAGNOSIS — I152 Hypertension secondary to endocrine disorders: Secondary | ICD-10-CM | POA: Diagnosis not present

## 2023-09-08 DIAGNOSIS — E1169 Type 2 diabetes mellitus with other specified complication: Secondary | ICD-10-CM

## 2023-09-08 DIAGNOSIS — N138 Other obstructive and reflux uropathy: Secondary | ICD-10-CM

## 2023-09-08 DIAGNOSIS — N401 Enlarged prostate with lower urinary tract symptoms: Secondary | ICD-10-CM

## 2023-09-08 DIAGNOSIS — Z7985 Long-term (current) use of injectable non-insulin antidiabetic drugs: Secondary | ICD-10-CM

## 2023-09-08 LAB — POCT GLYCOSYLATED HEMOGLOBIN (HGB A1C): Hemoglobin A1C: 8.9 % — AB (ref 4.0–5.6)

## 2023-09-08 MED ORDER — SEMAGLUTIDE (1 MG/DOSE) 4 MG/3ML ~~LOC~~ SOPN
1.0000 mg | PEN_INJECTOR | SUBCUTANEOUS | 3 refills | Status: DC
Start: 2023-09-08 — End: 2023-12-14

## 2023-09-08 MED ORDER — OZEMPIC (0.25 OR 0.5 MG/DOSE) 2 MG/3ML ~~LOC~~ SOPN: 0.5000 mg | PEN_INJECTOR | SUBCUTANEOUS | Status: DC

## 2023-09-08 NOTE — Progress Notes (Signed)
 Nathaniel Waters 68 y.o.   Chief Complaint  Patient presents with   Form Completion    Surgical Clearance (dental)    HISTORY OF PRESENT ILLNESS: This is a 68 y.o. male here for 76-month follow-up of diabetes and hypertension Planning on doing dental work but needs clearance History of stroke about 1 year ago.  On Plavix  and daily baby aspirin . Lab Results  Component Value Date   HGBA1C 8.9 (A) 06/16/2023   BP Readings from Last 3 Encounters:  09/08/23 108/72  08/26/23 112/75  08/11/23 116/60     HPI   Prior to Admission medications   Medication Sig Start Date End Date Taking? Authorizing Provider  aspirin  EC 81 MG tablet Take 1 tablet (81 mg total) by mouth daily. Swallow whole. 09/19/22 09/14/23 Yes Sheril Dines, MD  clopidogrel  (PLAVIX ) 75 MG tablet TAKE 1 TABLET BY MOUTH EVERY DAY 04/21/23  Yes Dariona Postma, Isidro Margo, MD  ezetimibe  (ZETIA ) 10 MG tablet Take 1 tablet (10 mg total) by mouth daily. 10/18/22  Yes Elvira Hammersmith, MD  FARXIGA  10 MG TABS tablet TAKE 1 TABLET BY MOUTH EVERY DAY 04/21/23  Yes Sontee Desena, Isidro Margo, MD  glipiZIDE  (GLUCOTROL  XL) 5 MG 24 hr tablet TAKE 1 TABLET BY MOUTH EVERY DAY WITH BREAKFAST 04/21/23  Yes Odyn Turko, Isidro Margo, MD  predniSONE  (DELTASONE ) 20 MG tablet Take 2 tablets (40 mg total) by mouth daily. 08/26/23  Yes Afton Albright, MD  rosuvastatin  (CRESTOR ) 40 MG tablet Take 1 tablet (40 mg total) by mouth daily. 10/18/22  Yes Marra Fraga, Isidro Margo, MD  Semaglutide ,0.25 or 0.5MG /DOS, (OZEMPIC , 0.25 OR 0.5 MG/DOSE,) 2 MG/3ML SOPN Inject 0.5 mg into the skin once a week. 09/08/23  Yes Elvira Hammersmith, MD  valsartan -hydrochlorothiazide  (DIOVAN -HCT) 160-12.5 MG tablet TAKE 1 TABLET BY MOUTH EVERY DAY 07/07/23  Yes Elvira Hammersmith, MD    Allergies  Allergen Reactions   Metformin And Related Nausea Only   Jardiance  [Empagliflozin ] Rash    Patient Active Problem List   Diagnosis Date Noted   Calcific tendonitis of right shoulder  05/13/2023   History of stroke 10/15/2022   Current smoker 11/18/2021   BPH (benign prostatic hyperplasia) 09/27/2021   BPH with obstruction/lower urinary tract symptoms 09/18/2021   Chronic hepatitis C without hepatic coma (HCC) 04/11/2019   Hypertension associated with diabetes (HCC) 04/09/2017   Cigarette nicotine  dependence with nicotine -induced disorder 04/09/2017   Dyslipidemia associated with type 2 diabetes mellitus (HCC) 10/23/2014   Dyslipidemia 09/22/2014   Elevated PSA 09/22/2014   GERD 12/30/2007   Type II diabetes mellitus, uncontrolled 12/29/2007   History of colonic polyps 12/29/2007    Past Medical History:  Diagnosis Date   Diabetes mellitus    Diabetes mellitus without complication (HCC)    GERD (gastroesophageal reflux disease)    Hyperlipidemia    Hypertension    Pneumonia    Stroke [facility])     Past Surgical History:  Procedure Laterality Date   CARPAL TUNNEL RELEASE     both    COLONOSCOPY  2018   TRANSURETHRAL RESECTION OF PROSTATE N/A 09/27/2021   Procedure: TRANSURETHRAL RESECTION OF THE PROSTATE (TURP), BIPOLAR;  Surgeon: Lahoma Pigg, MD;  Location: [facility];  Service: Urology;  Laterality: N/A;    Social History   Socioeconomic History   Marital status: Divorced    Spouse name: Not on file   Number of children: 4   Years of education: 11   Highest education level: Not on file  Occupational  History   Occupation: Loading Dock  Tobacco Use   Smoking status: Former    Current packs/day: 0.00    Average packs/day: 2.0 packs/day for 51.4 years (102.9 ttl pk-yrs)    Types: Cigarettes    Start date: 74    Quit date: 09/16/2022    Years since quitting: 0.9    Passive exposure: Past   Smokeless tobacco: Former    Quit date: 09/16/2022   Tobacco comments:    Quit smoking 09/2022  Vaping Use   Vaping status: Never Used  Substance and Sexual Activity   Alcohol use: Yes    Alcohol/week: 3.0 standard drinks of alcohol    Types: 1 Cans of beer,  2 Shots of liquor per week    Comment: occasionally on weekend   Drug use: No   Sexual activity: Not Currently  Other Topics Concern   Not on file  Social History Narrative   ** Merged History Encounter **       Fun: Listen to music and be around family and friends. Denies religious beliefs effecting health care.    Social Drivers of Corporate investment banker Strain: Low Risk  (06/11/2023)   Overall Financial Resource Strain (CARDIA)    Difficulty of Paying Living Expenses: Not hard at all  Food Insecurity: No Food Insecurity (06/11/2023)   Hunger Vital Sign    Worried About Running Out of Food in the Last Year: Never true    Ran Out of Food in the Last Year: Never true  Transportation Needs: No Transportation Needs (06/11/2023)   PRAPARE - Administrator, Civil Service (Medical): No    Lack of Transportation (Non-Medical): No  Physical Activity: Sufficiently Active (06/11/2023)   Exercise Vital Sign    Days of Exercise per Week: 7 days    Minutes of Exercise per Session: 60 min  Stress: No Stress Concern Present (06/11/2023)   [employer] of Occupational Health - Occupational Stress Questionnaire    Feeling of Stress : Not at all  Social Connections: Socially Isolated (06/11/2023)   Social Connection and Isolation Panel [NHANES]    Frequency of Communication with Friends and Family: More than three times a week    Frequency of Social Gatherings with Friends and Family: Once a week    Attends Religious Services: Never    Database administrator or Organizations: No    Attends Banker Meetings: Never    Marital Status: Divorced  Catering manager Violence: Not At Risk (06/11/2023)   Humiliation, Afraid, Rape, and Kick questionnaire    Fear of Current or Ex-Partner: No    Emotionally Abused: No    Physically Abused: No    Sexually Abused: No    Family History  Problem Relation Age of Onset   Breast cancer Mother    Heart disease Brother    Lung cancer  Father    Healthy Paternal Grandmother    Cancer Brother    Colon cancer Neg Hx    Esophageal cancer Neg Hx    Stomach cancer Neg Hx    Rectal cancer Neg Hx    Colon polyps Neg Hx      Review of Systems  Constitutional: Negative.  Negative for chills and fever.  HENT: Negative.  Negative for congestion and sore throat.   Respiratory: Negative.  Negative for cough and shortness of breath.   Cardiovascular: Negative.  Negative for chest pain and palpitations.  Gastrointestinal:  Negative for abdominal pain,  diarrhea, nausea and vomiting.  Genitourinary: Negative.  Negative for dysuria and hematuria.  Skin: Negative.  Negative for rash.  Neurological: Negative.  Negative for dizziness and headaches.  All other systems reviewed and are negative.   Vitals:   09/08/23 1357  BP: 108/72  Pulse: (!) 104  Temp: 98.3 F (36.8 C)  SpO2: 96%    Physical Exam Vitals reviewed.  Constitutional:      Appearance: Normal appearance.  HENT:     Head: Normocephalic.  Eyes:     Extraocular Movements: Extraocular movements intact.     Pupils: Pupils are equal, round, and reactive to light.  Cardiovascular:     Rate and Rhythm: Normal rate and regular rhythm.     Pulses: Normal pulses.     Heart sounds: Normal heart sounds.  Pulmonary:     Effort: Pulmonary effort is normal.     Breath sounds: Normal breath sounds.  Musculoskeletal:     Cervical back: No tenderness.  Lymphadenopathy:     Cervical: No cervical adenopathy.  Skin:    General: Skin is warm and dry.  Neurological:     Mental Status: He is alert and oriented to person, place, and time.  Psychiatric:        Mood and Affect: Mood normal.        Behavior: Behavior normal.    Results for orders placed or performed in visit on 09/08/23 (from the past 24 hours)  POCT HgB A1C     Status: Abnormal   Collection Time: 09/08/23  2:42 PM  Result Value Ref Range   Hemoglobin A1C 8.9 (A) 4.0 - 5.6 %   HbA1c POC (<> result,  manual entry)     HbA1c, POC (prediabetic range)     HbA1c, POC (controlled diabetic range)        ASSESSMENT & PLAN: A total of 44 minutes was spent with the patient and counseling/coordination of care regarding preparing for this visit, review of most recent office visit notes, review of multiple chronic medical conditions and their management, cardiovascular risks associated with uncontrolled diabetes, review of all medications and changes made, review of most recent bloodwork results including interpretation of today's hemoglobin A1c, review of health maintenance items, education on nutrition, prognosis, documentation, and need for follow up.  Problem List Items Addressed This Visit       Cardiovascular and Mediastinum   Hypertension associated with diabetes (HCC) - Primary   BP Readings from Last 3 Encounters:  09/08/23 108/72  08/26/23 112/75  08/11/23 116/60  Well-controlled hypertension Continue valsartan  HCT 160-12.5 mg daily Hemoglobin A1c still not at goal at 8.9 Cardiovascular risks associated with uncontrolled diabetes discussed Recommend to increase weekly Ozempic  dose to 1 mg Continue glipizide  5 mg daily and Farxiga  10 mg Diet and nutrition discussed Follow-up in 3 months       Relevant Medications   Semaglutide , 1 MG/DOSE, 4 MG/3ML SOPN   Other Relevant Orders   AMB Referral [facility]I Care Management     Endocrine   Dyslipidemia associated with type 2 diabetes mellitus (HCC)   Rosuvastatin  dose recently increased to 40 mg daily Secondary stroke prevention measures discussed      Relevant Medications   Semaglutide , 1 MG/DOSE, 4 MG/3ML SOPN   Other Relevant Orders   POCT HgB A1C (Completed)   AMB Referral VBCI Care Management     Genitourinary   BPH with obstruction/lower urinary tract symptoms   Stable and well-controlled. No concerns.  Other   History of stroke   Clinically stable. Secondary stroke prevention measures discussed Diet and  nutrition discussed Importance of blood pressure, diabetes, and cholesterol control addressed Continues on daily baby aspirin  and Plavix  75 mg daily Has follow-up appointment with neurologist next month      Patient Instructions  Continue daily glipizide  5 mg and Farxiga  10 mg Increase Ozempic  to 1 mg weekly.  New prescription sent to pharmacy of record today.  Diabetes Mellitus and Nutrition, Adult When you have diabetes, or diabetes mellitus, it is very important to have healthy eating habits because your blood sugar (glucose) levels are greatly affected by what you eat and drink. Eating healthy foods in the right amounts, at about the same times every day, can help you: Manage your blood glucose. Lower your risk of heart disease. Improve your blood pressure. Reach or maintain a healthy weight. What can affect my meal plan? Every person with diabetes is different, and each person has different needs for a meal plan. Your health care provider may recommend that you work with a dietitian to make a meal plan that is best for you. Your meal plan may vary depending on factors such as: The calories you need. The medicines you take. Your weight. Your blood glucose, blood pressure, and cholesterol levels. Your activity level. Other health conditions you have, such as heart or kidney disease. How do carbohydrates affect me? Carbohydrates, also called carbs, affect your blood glucose level more than any other type of food. Eating carbs raises the amount of glucose in your blood. It is important to know how many carbs you can safely have in each meal. This is different for every person. Your dietitian can help you calculate how many carbs you should have at each meal and for each snack. How does alcohol affect me? Alcohol can cause a decrease in blood glucose (hypoglycemia), especially if you use insulin  or take certain diabetes medicines by mouth. Hypoglycemia can be a life-threatening  condition. Symptoms of hypoglycemia, such as sleepiness, dizziness, and confusion, are similar to symptoms of having too much alcohol. Do not drink alcohol if: Your health care provider tells you not to drink. You are pregnant, may be pregnant, or are planning to become pregnant. If you drink alcohol: Limit how much you have to: 0-1 drink a day for women. 0-2 drinks a day for men. Know how much alcohol is in your drink. In the [place] one drink equals one 12 oz bottle of beer (355 mL), one 5 oz glass of wine (148 mL), or one 1 oz glass of hard liquor (44 mL). Keep yourself hydrated with water, diet soda, or unsweetened iced tea. Keep in mind that regular soda, juice, and other mixers may contain a lot of sugar and must be counted as carbs. What are tips for following this plan?  Reading food labels Start by checking the serving size on the Nutrition Facts label of packaged foods and drinks. The number of calories and the amount of carbs, fats, and other nutrients listed on the label are based on one serving of the item. Many items contain more than one serving per package. Check the total grams (g) of carbs in one serving. Check the number of grams of saturated fats and trans fats in one serving. Choose foods that have a low amount or none of these fats. Check the number of milligrams (mg) of salt (sodium) in one serving. Most people should limit total sodium intake to less than  2,300 mg per day. Always check the nutrition information of foods labeled as "low-fat" or "nonfat." These foods may be higher in added sugar or refined carbs and should be avoided. Talk to your dietitian to identify your daily goals for nutrients listed on the label. Shopping Avoid buying canned, pre-made, or processed foods. These foods tend to be high in fat, sodium, and added sugar. Shop around the outside edge of the grocery store. This is where you will most often find fresh fruits and vegetables, bulk grains, fresh  meats, and fresh dairy products. Cooking Use low-heat cooking methods, such as baking, instead of high-heat cooking methods, such as deep frying. Cook using healthy oils, such as olive, canola, or sunflower oil. Avoid cooking with butter, cream, or high-fat meats. Meal planning Eat meals and snacks regularly, preferably at the same times every day. Avoid going long periods of time without eating. Eat foods that are high in fiber, such as fresh fruits, vegetables, beans, and whole grains. Eat 4-6 oz (112-168 g) of lean protein each day, such as lean meat, chicken, fish, eggs, or tofu. One ounce (oz) (28 g) of lean protein is equal to: 1 oz (28 g) of meat, chicken, or fish. 1 egg.  cup (62 g) of tofu. Eat some foods each day that contain healthy fats, such as avocado, nuts, seeds, and fish. What foods should I eat? Fruits Berries. Apples. Oranges. Peaches. Apricots. Plums. Grapes. Mangoes. Papayas. Pomegranates. Kiwi. Cherries. Vegetables Leafy greens, including lettuce, spinach, kale, chard, collard greens, mustard greens, and cabbage. Beets. Cauliflower. Broccoli. Carrots. Green beans. Tomatoes. Peppers. Onions. Cucumbers. Brussels sprouts. Grains Whole grains, such as whole-wheat or whole-grain bread, crackers, tortillas, cereal, and pasta. Unsweetened oatmeal. Quinoa. Brown or wild rice. Meats and other proteins Seafood. Poultry without skin. Lean cuts of poultry and beef. Tofu. Nuts. Seeds. Dairy Low-fat or fat-free dairy products such as milk, yogurt, and cheese. The items listed above may not be a complete list of foods and beverages you can eat and drink. Contact a dietitian for more information. What foods should I avoid? Fruits Fruits canned with syrup. Vegetables Canned vegetables. Frozen vegetables with butter or cream sauce. Grains Refined white flour and flour products such as bread, pasta, snack foods, and cereals. Avoid all processed foods. Meats and other  proteins Fatty cuts of meat. Poultry with skin. Breaded or fried meats. Processed meat. Avoid saturated fats. Dairy Full-fat yogurt, cheese, or milk. Beverages Sweetened drinks, such as soda or iced tea. The items listed above may not be a complete list of foods and beverages you should avoid. Contact a dietitian for more information. Questions to ask a health care provider Do I need to meet with a certified diabetes care and education specialist? Do I need to meet with a dietitian? What number can I call if I have questions? When are the best times to check my blood glucose? Where to find more information: American Diabetes Association: diabetes.org Academy of Nutrition and Dietetics: eatright.[employer] of Diabetes and Digestive and Kidney Diseases: StageSync.si Association of Diabetes Care & Education Specialists: diabeteseducator.org Summary It is important to have healthy eating habits because your blood sugar (glucose) levels are greatly affected by what you eat and drink. It is important to use alcohol carefully. A healthy meal plan will help you manage your blood glucose and lower your risk of heart disease. Your health care provider may recommend that you work with a dietitian to make a meal plan that is best for  you. This information is not intended to replace advice given to you by your health care provider. Make sure you discuss any questions you have with your health care provider. Document Revised: 10/25/2019 Document Reviewed: 10/26/2019 Elsevier Patient Education  2024 Elsevier Inc.      Maryagnes Small, MD [facility]

## 2023-09-08 NOTE — Assessment & Plan Note (Signed)
Clinically stable. Secondary stroke prevention measures discussed Diet and nutrition discussed Importance of blood pressure, diabetes, and cholesterol control addressed Continues on daily baby aspirin and Plavix 75 mg daily Has follow-up appointment with neurologist next month

## 2023-09-08 NOTE — Assessment & Plan Note (Signed)
Stable and well-controlled.  No concerns.

## 2023-09-08 NOTE — Progress Notes (Signed)
 09/08/2023 Name: Milas Schappell MRN: [id number] DOB: 1955/11/28  Chief Complaint  Patient presents with   [place] Metric Diabetes    Refujio Haymer is a 68 y.o. year old male who presented for a telephone visit.   They were referred to the pharmacist by a quality report for assistance in managing diabetes.   Subjective:  Care Team: Primary Care Provider: Elvira Hammersmith, MD ; Next Scheduled Visit: not scheduled  Medication Access/Adherence  Current Pharmacy:  [id number] - [place], [place] - [address] AT [facility] [address] [place] [address] Phone: [phone] Fax: [phone]   Patient reports affordability concerns with their medications: No  Patient reports access/transportation concerns to their pharmacy: No  Patient reports adherence concerns with their medications:  No    Diabetes:  Current medications: Farxiga  10 mg daily, glipizide  XL 5 mg daily, Ozempic  0.5 mg weekly (started in March) Medications tried in the past: metformin  Pt notes he has had no N/V, constipation or diarrhea with Ozempic . He reports about 6 lb weight loss. His Ozempic  was $0 copay last time he got it filled  Current glucose readings: BG 160 this AM fasting. Typically 140-150 fasting  Using glucometer; testing 1 time daily   Current physical activity: typically walks daily   Objective:  Lab Results  Component Value Date   HGBA1C 8.9 (A) 06/16/2023    Lab Results  Component Value Date   CREATININE 1.33 06/16/2023   BUN 23 06/16/2023   NA 136 06/16/2023   K 4.5 06/16/2023   CL 101 06/16/2023   CO2 25 06/16/2023    Lab Results  Component Value Date   CHOL 99 06/16/2023   HDL 36.30 (L) 06/16/2023   LDLCALC 37 06/16/2023   LDLDIRECT 206.0 07/09/2021   TRIG 126.0 06/16/2023   CHOLHDL 3 06/16/2023    Medications Reviewed Today     Reviewed by Dion Frankel, RPH (Pharmacist) on 09/08/23 at 1112  Med List  Status: <None>   Medication Order Taking? Sig Documenting Provider Last Dose Status Informant  aspirin  EC 81 MG tablet [id number]  Take 1 tablet (81 mg total) by mouth daily. Swallow whole. Sheril Dines, MD  Active Self, Pharmacy Records  clopidogrel  (PLAVIX ) 75 MG tablet [id number]  TAKE 1 TABLET BY MOUTH EVERY DAY Sagardia, Miguel Jose, MD  Active Self, Pharmacy Records  ezetimibe  (ZETIA ) 10 MG tablet [id number]  Take 1 tablet (10 mg total) by mouth daily. Elvira Hammersmith, MD  Active Self, Pharmacy Records  FARXIGA  10 MG TABS tablet [id number] Yes TAKE 1 TABLET BY MOUTH EVERY DAY Sagardia, Miguel Jose, MD Taking Active Self, Pharmacy Records  glipiZIDE  (GLUCOTROL  XL) 5 MG 24 hr tablet [id number] Yes TAKE 1 TABLET BY MOUTH EVERY DAY WITH BREAKFAST Sagardia, Miguel Jose, MD Taking Active Self, Pharmacy Records  predniSONE  (DELTASONE ) 20 MG tablet [id number]  Take 2 tablets (40 mg total) by mouth daily. Afton Albright, MD  Active   rosuvastatin  (CRESTOR ) 40 MG tablet [id number]  Take 1 tablet (40 mg total) by mouth daily. Sagardia, Miguel Jose, MD  Active Self, Pharmacy Records  Semaglutide ,0.25 or 0.5MG /DOS, (OZEMPIC , 0.25 OR 0.5 MG/DOSE,) 2 MG/3ML SOPN [id number] Yes Inject 0.25 mg into the skin once a week. Increase dose to 0.5 mg after 2 weeks if side effects tolerated  Patient taking differently: Inject 0.5 mg into the skin once a week.   Elvira Hammersmith, MD Taking Active   valsartan -hydrochlorothiazide  (DIOVAN -HCT) 160-12.5  MG tablet [id number]  TAKE 1 TABLET BY MOUTH EVERY DAY Sagardia, Isidro Margo, MD  Active               Assessment/Plan:   Diabetes: - Currently uncontrolled, A1c goal <7% - Reviewed goal A1c, goal fasting, and goal 2 hour post prandial glucose - Recommend to continue current regimen. Wait for next A1c check. If A1c remains elevated, recommend increasing Ozempic  to 1 mg weekly - Recommend to check glucose daily, checking occasionally in the  evenings   Follow Up Plan: PCP f/u today and also 6/18 - wait for updated A1c  Rainelle Bur, PharmD, BCPS, CPP Clinical Pharmacist Practitioner [facility] [phone]

## 2023-09-08 NOTE — Assessment & Plan Note (Signed)
Rosuvastatin dose recently increased to 40 mg daily Secondary stroke prevention measures discussed

## 2023-09-08 NOTE — Assessment & Plan Note (Signed)
 BP Readings from Last 3 Encounters:  09/08/23 108/72  08/26/23 112/75  08/11/23 116/60  Well-controlled hypertension Continue valsartan  HCT 160-12.5 mg daily Hemoglobin A1c still not at goal at 8.9 Cardiovascular risks associated with uncontrolled diabetes discussed Recommend to increase weekly Ozempic  dose to 1 mg Continue glipizide  5 mg daily and Farxiga  10 mg Diet and nutrition discussed Follow-up in 3 months

## 2023-09-08 NOTE — Patient Instructions (Signed)
 Continue daily glipizide  5 mg and Farxiga  10 mg Increase Ozempic  to 1 mg weekly.  New prescription sent to pharmacy of record today.  Diabetes Mellitus and Nutrition, Adult When you have diabetes, or diabetes mellitus, it is very important to have healthy eating habits because your blood sugar (glucose) levels are greatly affected by what you eat and drink. Eating healthy foods in the right amounts, at about the same times every day, can help you: Manage your blood glucose. Lower your risk of heart disease. Improve your blood pressure. Reach or maintain a healthy weight. What can affect my meal plan? Every person with diabetes is different, and each person has different needs for a meal plan. Your health care provider may recommend that you work with a dietitian to make a meal plan that is best for you. Your meal plan may vary depending on factors such as: The calories you need. The medicines you take. Your weight. Your blood glucose, blood pressure, and cholesterol levels. Your activity level. Other health conditions you have, such as heart or kidney disease. How do carbohydrates affect me? Carbohydrates, also called carbs, affect your blood glucose level more than any other type of food. Eating carbs raises the amount of glucose in your blood. It is important to know how many carbs you can safely have in each meal. This is different for every person. Your dietitian can help you calculate how many carbs you should have at each meal and for each snack. How does alcohol affect me? Alcohol can cause a decrease in blood glucose (hypoglycemia), especially if you use insulin  or take certain diabetes medicines by mouth. Hypoglycemia can be a life-threatening condition. Symptoms of hypoglycemia, such as sleepiness, dizziness, and confusion, are similar to symptoms of having too much alcohol. Do not drink alcohol if: Your health care provider tells you not to drink. You are pregnant, may be pregnant,  or are planning to become pregnant. If you drink alcohol: Limit how much you have to: 0-1 drink a day for women. 0-2 drinks a day for men. Know how much alcohol is in your drink. In the [place] one drink equals one 12 oz bottle of beer (355 mL), one 5 oz glass of wine (148 mL), or one 1 oz glass of hard liquor (44 mL). Keep yourself hydrated with water, diet soda, or unsweetened iced tea. Keep in mind that regular soda, juice, and other mixers may contain a lot of sugar and must be counted as carbs. What are tips for following this plan?  Reading food labels Start by checking the serving size on the Nutrition Facts label of packaged foods and drinks. The number of calories and the amount of carbs, fats, and other nutrients listed on the label are based on one serving of the item. Many items contain more than one serving per package. Check the total grams (g) of carbs in one serving. Check the number of grams of saturated fats and trans fats in one serving. Choose foods that have a low amount or none of these fats. Check the number of milligrams (mg) of salt (sodium) in one serving. Most people should limit total sodium intake to less than 2,300 mg per day. Always check the nutrition information of foods labeled as "low-fat" or "nonfat." These foods may be higher in added sugar or refined carbs and should be avoided. Talk to your dietitian to identify your daily goals for nutrients listed on the label. Shopping Avoid buying canned, pre-made, or processed foods.  These foods tend to be high in fat, sodium, and added sugar. Shop around the outside edge of the grocery store. This is where you will most often find fresh fruits and vegetables, bulk grains, fresh meats, and fresh dairy products. Cooking Use low-heat cooking methods, such as baking, instead of high-heat cooking methods, such as deep frying. Cook using healthy oils, such as olive, canola, or sunflower oil. Avoid cooking with butter,  cream, or high-fat meats. Meal planning Eat meals and snacks regularly, preferably at the same times every day. Avoid going long periods of time without eating. Eat foods that are high in fiber, such as fresh fruits, vegetables, beans, and whole grains. Eat 4-6 oz (112-168 g) of lean protein each day, such as lean meat, chicken, fish, eggs, or tofu. One ounce (oz) (28 g) of lean protein is equal to: 1 oz (28 g) of meat, chicken, or fish. 1 egg.  cup (62 g) of tofu. Eat some foods each day that contain healthy fats, such as avocado, nuts, seeds, and fish. What foods should I eat? Fruits Berries. Apples. Oranges. Peaches. Apricots. Plums. Grapes. Mangoes. Papayas. Pomegranates. Kiwi. Cherries. Vegetables Leafy greens, including lettuce, spinach, kale, chard, collard greens, mustard greens, and cabbage. Beets. Cauliflower. Broccoli. Carrots. Green beans. Tomatoes. Peppers. Onions. Cucumbers. Brussels sprouts. Grains Whole grains, such as whole-wheat or whole-grain bread, crackers, tortillas, cereal, and pasta. Unsweetened oatmeal. Quinoa. Brown or wild rice. Meats and other proteins Seafood. Poultry without skin. Lean cuts of poultry and beef. Tofu. Nuts. Seeds. Dairy Low-fat or fat-free dairy products such as milk, yogurt, and cheese. The items listed above may not be a complete list of foods and beverages you can eat and drink. Contact a dietitian for more information. What foods should I avoid? Fruits Fruits canned with syrup. Vegetables Canned vegetables. Frozen vegetables with butter or cream sauce. Grains Refined white flour and flour products such as bread, pasta, snack foods, and cereals. Avoid all processed foods. Meats and other proteins Fatty cuts of meat. Poultry with skin. Breaded or fried meats. Processed meat. Avoid saturated fats. Dairy Full-fat yogurt, cheese, or milk. Beverages Sweetened drinks, such as soda or iced tea. The items listed above may not be a complete  list of foods and beverages you should avoid. Contact a dietitian for more information. Questions to ask a health care provider Do I need to meet with a certified diabetes care and education specialist? Do I need to meet with a dietitian? What number can I call if I have questions? When are the best times to check my blood glucose? Where to find more information: American Diabetes Association: diabetes.org Academy of Nutrition and Dietetics: eatright.[employer] of Diabetes and Digestive and Kidney Diseases: StageSync.si Association of Diabetes Care & Education Specialists: diabeteseducator.org Summary It is important to have healthy eating habits because your blood sugar (glucose) levels are greatly affected by what you eat and drink. It is important to use alcohol carefully. A healthy meal plan will help you manage your blood glucose and lower your risk of heart disease. Your health care provider may recommend that you work with a dietitian to make a meal plan that is best for you. This information is not intended to replace advice given to you by your health care provider. Make sure you discuss any questions you have with your health care provider. Document Revised: 10/25/2019 Document Reviewed: 10/26/2019 Elsevier Patient Education  2024 [employer].

## 2023-09-16 ENCOUNTER — Encounter: Payer: Self-pay | Admitting: Internal Medicine

## 2023-09-16 ENCOUNTER — Ambulatory Visit: Admitting: Emergency Medicine

## 2023-09-17 ENCOUNTER — Other Ambulatory Visit: Payer: Self-pay | Admitting: Emergency Medicine

## 2023-09-17 ENCOUNTER — Telehealth: Payer: Self-pay | Admitting: *Deleted

## 2023-09-17 DIAGNOSIS — E1169 Type 2 diabetes mellitus with other specified complication: Secondary | ICD-10-CM

## 2023-09-17 NOTE — Progress Notes (Signed)
 Care Guide Pharmacy Note  09/17/2023 Name: Nathaniel Waters MRN: [id number] DOB: 02-05-1956  Referred By: Elvira Hammersmith, MD Reason for referral: Complex Care Management (Outreach to schedule referral with pharmacist )   Nathaniel Waters is a 68 y.o. year old male who is a primary care patient of Elvira Hammersmith, MD.  Nathaniel Waters was referred to the pharmacist for assistance related to: HTN  Successful contact was made with the patient to discuss pharmacy services including being ready for the pharmacist to call at least 5 minutes before the scheduled appointment time and to have medication bottles and any blood pressure readings ready for review. The patient agreed to meet with the pharmacist via telephone visit on 09/28/2023  Nathaniel Waters, CMA [place]  [facility], [facility] Guide Direct Dial: [phone]  Fax: [phone] Website: [email or web address]

## 2023-09-23 ENCOUNTER — Encounter: Payer: Self-pay | Admitting: Internal Medicine

## 2023-09-23 ENCOUNTER — Ambulatory Visit: Admitting: Internal Medicine

## 2023-09-23 VITALS — BP 117/80 | HR 76 | Temp 98.2°F | Resp 20 | Ht 70.5 in | Wt 228.0 lb

## 2023-09-23 DIAGNOSIS — Z860100 Personal history of colon polyps, unspecified: Secondary | ICD-10-CM

## 2023-09-23 DIAGNOSIS — E785 Hyperlipidemia, unspecified: Secondary | ICD-10-CM | POA: Diagnosis not present

## 2023-09-23 DIAGNOSIS — D122 Benign neoplasm of ascending colon: Secondary | ICD-10-CM

## 2023-09-23 DIAGNOSIS — I10 Essential (primary) hypertension: Secondary | ICD-10-CM | POA: Diagnosis not present

## 2023-09-23 DIAGNOSIS — K5730 Diverticulosis of large intestine without perforation or abscess without bleeding: Secondary | ICD-10-CM | POA: Diagnosis not present

## 2023-09-23 DIAGNOSIS — Z1211 Encounter for screening for malignant neoplasm of colon: Secondary | ICD-10-CM | POA: Diagnosis not present

## 2023-09-23 MED ORDER — SODIUM CHLORIDE 0.9 % IV SOLN: 500.0000 mL | Freq: Once | INTRAVENOUS | Status: DC

## 2023-09-23 NOTE — Patient Instructions (Signed)
  Resume previous diet Continue present medications Resume Plavix  at prior dose today.  However, patient has instructions to continue to hold Plavix  for a dental appointment tomorrow.  Refer to managing physician for further adjustment of therapy. Await pathology results   YOU HAD AN ENDOSCOPIC PROCEDURE TODAY AT THE [facility]:   Refer to the procedure report that was given to you for any specific questions about what was found during the examination.  If the procedure report does not answer your questions, please call your gastroenterologist to clarify.  If you requested that your care partner not be given the details of your procedure findings, then the procedure report has been included in a sealed envelope for you to review at your convenience later.  YOU SHOULD EXPECT: Some feelings of bloating in the abdomen. Passage of more gas than usual.  Walking can help get rid of the air that was put into your GI tract during the procedure and reduce the bloating. If you had a lower endoscopy (such as a colonoscopy or flexible sigmoidoscopy) you may notice spotting of blood in your stool or on the toilet paper. If you underwent a bowel prep for your procedure, you may not have a normal bowel movement for a few days.  Please Note:  You might notice some irritation and congestion in your nose or some drainage.  This is from the oxygen used during your procedure.  There is no need for concern and it should clear up in a day or so.  SYMPTOMS TO REPORT IMMEDIATELY:  Following lower endoscopy (colonoscopy or flexible sigmoidoscopy):  Excessive amounts of blood in the stool  Significant tenderness or worsening of abdominal pains  Swelling of the abdomen that is new, acute  Fever of 100F or higher   For urgent or emergent issues, a gastroenterologist can be reached at any hour by calling [phone]. Do not use MyChart messaging for urgent concerns.    DIET:  We do recommend a small  meal at first, but then you may proceed to your regular diet.  Drink plenty of fluids but you should avoid alcoholic beverages for 24 hours.  ACTIVITY:  You should plan to take it easy for the rest of today and you should NOT DRIVE or use heavy machinery until tomorrow (because of the sedation medicines used during the test).    FOLLOW UP: Our staff will call the number listed on your records the next business day following your procedure.  We will call around 7:15- 8:00 am to check on you and address any questions or concerns that you may have regarding the information given to you following your procedure. If we do not reach you, we will leave a message.     If any biopsies were taken you will be contacted by phone or by letter within the next 1-3 weeks.  Please call us  at [phone] if you have not heard about the biopsies in 3 weeks.    SIGNATURES/CONFIDENTIALITY: You and/or your care partner have signed paperwork which will be entered into your electronic medical record.  These signatures attest to the fact that that the information above on your After Visit Summary has been reviewed and is understood.  Full responsibility of the confidentiality of this discharge information lies with you and/or your care-partner.

## 2023-09-23 NOTE — Progress Notes (Signed)
 Patient ID: Nathaniel Waters, male   DOB: 08/08/1955, 68 y.o.   MRN: [id number]    GASTROENTEROLOGY PROCEDURE H&P NOTE   Primary Care Physician: Elvira Hammersmith, MD    Reason for Procedure:  History of colonic polyps  Plan:    Colonoscopy  Patient is appropriate for endoscopic procedure(s) in the ambulatory ([facility]) setting.  The nature of the procedure, as well as the risks, benefits, and alternatives were carefully and thoroughly reviewed with the patient. Ample time for discussion and questions allowed. The patient understood, was satisfied, and agreed to proceed.     HPI: Nathaniel Waters is a 68 y.o. male who presents for surveillance colonoscopy.  Medical history as below.  Tolerated the prep.  No recent chest pain or shortness of breath.  No abdominal pain today. Plavix  on hold x 5 days  Past Medical History:  Diagnosis Date   Diabetes mellitus    Diabetes mellitus without complication (HCC)    GERD (gastroesophageal reflux disease)    Hyperlipidemia    Hypertension    Pneumonia    Stroke [facility])     Past Surgical History:  Procedure Laterality Date   CARPAL TUNNEL RELEASE     both    COLONOSCOPY  2018   TRANSURETHRAL RESECTION OF PROSTATE N/A 09/27/2021   Procedure: TRANSURETHRAL RESECTION OF THE PROSTATE (TURP), BIPOLAR;  Surgeon: Lahoma Pigg, MD;  Location: [facility];  Service: Urology;  Laterality: N/A;    Prior to Admission medications   Medication Sig Start Date End Date Taking? Authorizing Provider  ezetimibe  (ZETIA ) 10 MG tablet Take 1 tablet (10 mg total) by mouth daily. 10/18/22  Yes Elvira Hammersmith, MD  FARXIGA  10 MG TABS tablet TAKE 1 TABLET BY MOUTH EVERY DAY 04/21/23  Yes Sagardia, Isidro Margo, MD  glipiZIDE  (GLUCOTROL  XL) 5 MG 24 hr tablet TAKE 1 TABLET BY MOUTH EVERY DAY WITH BREAKFAST 04/21/23  Yes Sagardia, Isidro Margo, MD  predniSONE  (DELTASONE ) 20 MG tablet Take 2 tablets (40 mg total) by mouth daily. 08/26/23  Yes Hagler, Polly Brink, MD  rosuvastatin   (CRESTOR ) 40 MG tablet TAKE 1 TABLET BY MOUTH EVERY DAY 09/17/23  Yes Sagardia, Isidro Margo, MD  valsartan -hydrochlorothiazide  (DIOVAN -HCT) 160-12.5 MG tablet TAKE 1 TABLET BY MOUTH EVERY DAY 07/07/23  Yes Sagardia, Isidro Margo, MD  clopidogrel  (PLAVIX ) 75 MG tablet TAKE 1 TABLET BY MOUTH EVERY DAY 04/21/23   Elvira Hammersmith, MD  Semaglutide , 1 MG/DOSE, 4 MG/3ML SOPN Inject 1 mg as directed once a week. 09/08/23   Elvira Hammersmith, MD    Current Outpatient Medications  Medication Sig Dispense Refill   ezetimibe  (ZETIA ) 10 MG tablet Take 1 tablet (10 mg total) by mouth daily. 90 tablet 3   FARXIGA  10 MG TABS tablet TAKE 1 TABLET BY MOUTH EVERY DAY 90 tablet 1   glipiZIDE  (GLUCOTROL  XL) 5 MG 24 hr tablet TAKE 1 TABLET BY MOUTH EVERY DAY WITH BREAKFAST 90 tablet 1   predniSONE  (DELTASONE ) 20 MG tablet Take 2 tablets (40 mg total) by mouth daily. 10 tablet 0   rosuvastatin  (CRESTOR ) 40 MG tablet TAKE 1 TABLET BY MOUTH EVERY DAY 90 tablet 3   valsartan -hydrochlorothiazide  (DIOVAN -HCT) 160-12.5 MG tablet TAKE 1 TABLET BY MOUTH EVERY DAY 90 tablet 3   clopidogrel  (PLAVIX ) 75 MG tablet TAKE 1 TABLET BY MOUTH EVERY DAY 90 tablet 1   Semaglutide , 1 MG/DOSE, 4 MG/3ML SOPN Inject 1 mg as directed once a week. 3 mL 3   Current Facility-Administered Medications  Medication Dose Route Frequency Provider Last Rate Last Admin   0.9 %  sodium chloride  infusion  500 mL Intravenous Once Dhanush Jokerst, Amber Bail, MD        Allergies as of 09/23/2023 - Review Complete 09/23/2023  Allergen Reaction Noted   Jardiance  [empagliflozin ] Rash 12/28/2016   Metformin and related Nausea Only 04/20/2017    Family History  Problem Relation Age of Onset   Breast cancer Mother    Heart disease Brother    Lung cancer Father    Healthy Paternal Grandmother    Cancer Brother    Colon cancer Neg Hx    Esophageal cancer Neg Hx    Stomach cancer Neg Hx    Rectal cancer Neg Hx    Colon polyps Neg Hx     Social History    Socioeconomic History   Marital status: Divorced    Spouse name: Not on file   Number of children: 4   Years of education: 11   Highest education level: Not on file  Occupational History   Occupation: Loading Dock  Tobacco Use   Smoking status: Former    Current packs/day: 0.00    Average packs/day: 2.0 packs/day for 51.4 years (102.9 ttl pk-yrs)    Types: Cigarettes    Start date: 109    Quit date: 09/16/2022    Years since quitting: 1.0    Passive exposure: Past   Smokeless tobacco: Former    Quit date: 09/16/2022   Tobacco comments:    Quit smoking 09/2022  Vaping Use   Vaping status: Never Used  Substance and Sexual Activity   Alcohol use: Yes    Alcohol/week: 3.0 standard drinks of alcohol    Types: 1 Cans of beer, 2 Shots of liquor per week    Comment: occasionally on weekend   Drug use: No   Sexual activity: Not Currently  Other Topics Concern   Not on file  Social History Narrative   ** Merged History Encounter **       Fun: Listen to music and be around family and friends. Denies religious beliefs effecting health care.    Social Drivers of Corporate investment banker Strain: Low Risk  (06/11/2023)   Overall Financial Resource Strain (CARDIA)    Difficulty of Paying Living Expenses: Not hard at all  Food Insecurity: No Food Insecurity (06/11/2023)   Hunger Vital Sign    Worried About Running Out of Food in the Last Year: Never true    Ran Out of Food in the Last Year: Never true  Transportation Needs: No Transportation Needs (06/11/2023)   PRAPARE - Administrator, Civil Service (Medical): No    Lack of Transportation (Non-Medical): No  Physical Activity: Sufficiently Active (06/11/2023)   Exercise Vital Sign    Days of Exercise per Week: 7 days    Minutes of Exercise per Session: 60 min  Stress: No Stress Concern Present (06/11/2023)   [employer] of Occupational Health - Occupational Stress Questionnaire    Feeling of Stress : Not at all   Social Connections: Socially Isolated (06/11/2023)   Social Connection and Isolation Panel    Frequency of Communication with Friends and Family: More than three times a week    Frequency of Social Gatherings with Friends and Family: Once a week    Attends Religious Services: Never    Database administrator or Organizations: No    Attends Banker Meetings: Never  Marital Status: Divorced  Catering manager Violence: Not At Risk (06/11/2023)   Humiliation, Afraid, Rape, and Kick questionnaire    Fear of Current or Ex-Partner: No    Emotionally Abused: No    Physically Abused: No    Sexually Abused: No    Physical Exam: Vital signs in last 24 hours: @BP  (!) 144/80   Pulse 91   Temp 98.2 F (36.8 C) (Temporal)   Ht 5' 10.5 (1.791 m)   Wt 228 lb (103.4 kg)   SpO2 98%   BMI 32.25 kg/m  GEN: NAD EYE: Sclerae anicteric ENT: MMM CV: Non-tachycardic Pulm: CTA b/l GI: Soft, NT/ND NEURO:  Alert & Oriented x 3   Laurell Pond, MD [facility]  09/23/2023 2:32 PM

## 2023-09-23 NOTE — Progress Notes (Signed)
 Called to room to assist during endoscopic procedure.  Patient ID and intended procedure confirmed with present staff. Received instructions for my participation in the procedure from the performing physician.

## 2023-09-23 NOTE — Op Note (Signed)
 [facility] Patient Name: Nathaniel Waters Procedure Date: 09/23/2023 2:34 PM MRN: [id number] Endoscopist: Nannette Babe , MD, [id number] Age: 68 Referring MD:  Date of Birth: [birth year] Gender: Male Account #: [id number] Procedure:                Colonoscopy Indications:              High risk colon cancer surveillance: Personal                            history of multiple adenomas, Last colonoscopy: May                            2022 (TA x 5) Medicines:                Monitored Anesthesia Care Procedure:                Pre-Anesthesia Assessment:                           - Prior to the procedure, a History and Physical                            was performed, and patient medications and                            allergies were reviewed. The patient's tolerance of                            previous anesthesia was also reviewed. The risks                            and benefits of the procedure and the sedation                            options and risks were discussed with the patient.                            All questions were answered, and informed consent                            was obtained. Prior Anticoagulants: The patient has                            taken Plavix  (clopidogrel ), last dose was 5 days                            prior to procedure. ASA Grade Assessment: III - A                            patient with severe systemic disease. After                            reviewing the risks and benefits, the patient was  deemed in satisfactory condition to undergo the                            procedure.                           After obtaining informed consent, the colonoscope                            was passed under direct vision. Throughout the                            procedure, the patient's blood pressure, pulse, and                            oxygen saturations were monitored continuously. The                             CF [id number] #[id number] was introduced through the anus                            and advanced to the cecum, identified by                            appendiceal orifice and ileocecal valve. The                            colonoscopy was performed without difficulty. The                            patient tolerated the procedure well. The quality                            of the bowel preparation was excellent. The                            ileocecal valve, appendiceal orifice, and rectum                            were photographed. Scope In: 2:39:57 PM Scope Out: 2:51:05 PM Scope Withdrawal Time: 0 hours 8 minutes 58 seconds  Total Procedure Duration: 0 hours 11 minutes 8 seconds  Findings:                 The digital rectal exam was normal.                           Two sessile polyps were found in the ascending                            colon. The polyps were 3 to 5 mm in size. These                            polyps were removed with a cold snare. Resection  and retrieval were complete.                           Multiple medium-mouthed and small-mouthed                            diverticula were found in the sigmoid colon and                            descending colon.                           The retroflexed view of the distal rectum and anal                            verge was normal and showed no anal or rectal                            abnormalities. Complications:            No immediate complications. Estimated Blood Loss:     Estimated blood loss was minimal. Impression:               - Two 3 to 5 mm polyps in the ascending colon,                            removed with a cold snare. Resected and retrieved.                           - Mild diverticulosis in the sigmoid colon and in                            the descending colon.                           - The distal rectum and anal verge are normal on                            retroflexion  view. Recommendation:           - Patient has a contact number available for                            emergencies. The signs and symptoms of potential                            delayed complications were discussed with the                            patient. Return to normal activities tomorrow.                            Written discharge instructions were provided to the                            patient.                           -  Resume previous diet.                           - Continue present medications.                           - Resume Plavix  (clopidogrel ) at prior dose today                            from GI perspective. However patient has                            instructions to continue to hold Plavix  for a                            dental appointment tomorrow. Refer to managing                            physician for further adjustment of therapy.                           - Await pathology results.                           - Repeat colonoscopy is recommended for                            surveillance. The colonoscopy date will be                            determined after pathology results from today's                            exam become available for review. Nannette Babe, MD 09/23/2023 2:54:40 PM This report has been signed electronically.

## 2023-09-23 NOTE — Progress Notes (Signed)
 Pt sedate, gd SR's, VSS, report to RN

## 2023-09-24 ENCOUNTER — Telehealth: Payer: Self-pay | Admitting: Lactation Services

## 2023-09-24 NOTE — Telephone Encounter (Signed)
  Follow up Call-     09/23/2023    1:58 PM  Call back number  Post procedure Call Back phone  # [phone]  Permission to leave phone message Yes     Patient questions:  Do you have a fever, pain , or abdominal swelling? No. Pain Score  0 *  Have you tolerated food without any problems? Yes.    Have you been able to return to your normal activities? Yes.    Do you have any questions about your discharge instructions: Diet   No. Medications  No. Follow up visit  No.  Do you have questions or concerns about your Care? No.  Actions: * If pain score is 4 or above: No action needed, pain <4.

## 2023-09-28 ENCOUNTER — Other Ambulatory Visit: Payer: Self-pay | Admitting: Emergency Medicine

## 2023-09-28 ENCOUNTER — Other Ambulatory Visit (INDEPENDENT_AMBULATORY_CARE_PROVIDER_SITE_OTHER): Admitting: Pharmacist

## 2023-09-28 DIAGNOSIS — E1169 Type 2 diabetes mellitus with other specified complication: Secondary | ICD-10-CM

## 2023-09-28 DIAGNOSIS — E785 Hyperlipidemia, unspecified: Secondary | ICD-10-CM

## 2023-09-28 LAB — SURGICAL PATHOLOGY

## 2023-09-28 NOTE — Patient Instructions (Signed)
 It was a pleasure speaking with you today!  Continue your current regimen. We will check your A1c in September.  Feel free to call with any questions or concerns!  Darrelyn Drum, PharmD, BCPS, CPP Clinical Pharmacist Practitioner [place] [facility] [phone]

## 2023-09-28 NOTE — Progress Notes (Signed)
 09/28/2023 Name: Nathaniel Waters MRN: [id number] DOB: Sep 15, 1955  Chief Complaint  Patient presents with   Diabetes   Medication Management    Nathaniel Waters is a 68 y.o. year old male who presented for a telephone visit.   They were referred to the pharmacist by a quality report for assistance in managing diabetes.   Subjective:  Care Team: Primary Care Provider: Purcell Emil Schanz, MD ; Next Scheduled Visit: 06/2024  Medication Access/Adherence  Current Pharmacy:  [id number] - [place], [place] - [address] AT [facility] [address] [place] [address] Phone: [phone] Fax: [phone]   Patient reports affordability concerns with their medications: No  Patient reports access/transportation concerns to their pharmacy: No  Patient reports adherence concerns with their medications:  No    Diabetes:  Current medications: Farxiga  10 mg daily, glipizide  XL 5 mg daily, Ozempic  1 mg weekly (started this week) Medications tried in the past: metformin  Pt notes he has had no N/V, constipation or diarrhea with Ozempic . He reports about 6 lb weight loss. His Ozempic  was $0 copay last time he got it filled  Current glucose readings: BG 122 fasting this morning, 127 yesterday. 160-170 about 1 hour after eating   Using glucometer; testing 1 time daily   Current physical activity: walks daily after breakfast   Objective:  Lab Results  Component Value Date   HGBA1C 8.9 (A) 09/08/2023    Lab Results  Component Value Date   CREATININE 1.33 06/16/2023   BUN 23 06/16/2023   NA 136 06/16/2023   K 4.5 06/16/2023   CL 101 06/16/2023   CO2 25 06/16/2023    Lab Results  Component Value Date   CHOL 99 06/16/2023   HDL 36.30 (L) 06/16/2023   LDLCALC 37 06/16/2023   LDLDIRECT 206.0 07/09/2021   TRIG 126.0 06/16/2023   CHOLHDL 3 06/16/2023    Medications Reviewed Today     Reviewed by Merceda Lela SAUNDERS, RPH  (Pharmacist) on 09/28/23 at 1556  Med List Status: <None>   Medication Order Taking? Sig Documenting Provider Last Dose Status Informant  clopidogrel  (PLAVIX ) 75 MG tablet [id number] Yes TAKE 1 TABLET BY MOUTH EVERY DAY Sagardia, Emil Schanz, MD  Active Self, Pharmacy Records  ezetimibe  (ZETIA ) 10 MG tablet [id number] Yes TAKE 1 TABLET BY MOUTH EVERY DAY Sagardia, Miguel Jose, MD  Active   FARXIGA  10 MG TABS tablet [id number] Yes TAKE 1 TABLET BY MOUTH EVERY DAY Sagardia, Miguel Jose, MD  Active Self, Pharmacy Records  glipiZIDE  (GLUCOTROL  XL) 5 MG 24 hr tablet [id number] Yes TAKE 1 TABLET BY MOUTH EVERY DAY WITH BREAKFAST Sagardia, Miguel Jose, MD  Active Self, Pharmacy Records  predniSONE  (DELTASONE ) 20 MG tablet [id number]  Take 2 tablets (40 mg total) by mouth daily. Rolinda Rogue, MD  Consider Medication Status and Discontinue (Completed Course)   rosuvastatin  (CRESTOR ) 40 MG tablet [id number] Yes TAKE 1 TABLET BY MOUTH EVERY DAY Purcell Emil Schanz, MD  Active   Semaglutide , 1 MG/DOSE, 4 MG/3ML SOPN [id number] Yes Inject 1 mg as directed once a week. Sagardia, Miguel Jose, MD  Active   valsartan -hydrochlorothiazide  (DIOVAN -HCT) 160-12.5 MG tablet [id number] Yes TAKE 1 TABLET BY MOUTH EVERY DAY Sagardia, Emil Schanz, MD  Active               Assessment/Plan:   Diabetes: - Currently uncontrolled, A1c goal <7%. Pt reported BG are improved from previous  - Reviewed goal A1c, goal fasting,  and goal 2 hour post prandial glucose - Recommend to continue current regimen since Ozempic  was just increased - Recommend to check glucose daily, checking occasionally in the evenings   Follow Up Plan: Tech f/u in 1 month, A1c check early September  Darrelyn Drum, PharmD, BCPS, CPP Clinical Pharmacist Practitioner [place] [facility] at [facility] [phone]

## 2023-09-30 ENCOUNTER — Other Ambulatory Visit: Payer: Self-pay | Admitting: Emergency Medicine

## 2023-09-30 ENCOUNTER — Other Ambulatory Visit: Payer: Self-pay | Admitting: Family

## 2023-09-30 DIAGNOSIS — I10 Essential (primary) hypertension: Secondary | ICD-10-CM

## 2023-09-30 DIAGNOSIS — I152 Hypertension secondary to endocrine disorders: Secondary | ICD-10-CM

## 2023-10-05 ENCOUNTER — Ambulatory Visit: Payer: Self-pay | Admitting: Internal Medicine

## 2023-10-08 ENCOUNTER — Other Ambulatory Visit: Payer: Self-pay | Admitting: Radiology

## 2023-10-08 ENCOUNTER — Telehealth: Payer: Self-pay | Admitting: Emergency Medicine

## 2023-10-08 DIAGNOSIS — E1169 Type 2 diabetes mellitus with other specified complication: Secondary | ICD-10-CM

## 2023-10-08 MED ORDER — ACCU-CHEK GUIDE TEST VI STRP: ORAL_STRIP | 12 refills | Status: AC

## 2023-10-08 NOTE — Telephone Encounter (Unsigned)
 Copied from CRM [id number]. Topic: Clinical - Medication Refill >> Oct 08, 2023  8:03 AM Nathaniel Waters wrote: Medication: accu chek guide test strips  Has the patient contacted their pharmacy? Yes, patient was informed to contact his primary care provider. (Agent: If no, request that the patient contact the pharmacy for the refill. If patient does not wish to contact the pharmacy document the reason why and proceed with request.) (Agent: If yes, when and what did the pharmacy advise?)  This is the patient's preferred pharmacy:  [id number] - [place], [place] - [address] AT [facility] [address]  [place] [address] Phone: [phone] Fax: [phone]  Is this the correct pharmacy for this prescription? Yes If no, delete pharmacy and type the correct one.   Has the prescription been filled recently? No  Is the patient out of the medication? No  Has the patient been seen for an appointment in the last year OR does the patient have an upcoming appointment? Yes  Can we respond through MyChart? No  Agent: Please be advised that Rx refills may take up to 3 business days. We ask that you follow-up with your pharmacy.

## 2023-10-13 ENCOUNTER — Other Ambulatory Visit: Payer: Self-pay | Admitting: Emergency Medicine

## 2023-10-13 DIAGNOSIS — Z8673 Personal history of transient ischemic attack (TIA), and cerebral infarction without residual deficits: Secondary | ICD-10-CM

## 2023-10-27 ENCOUNTER — Telehealth: Payer: Self-pay | Admitting: Pharmacy Technician

## 2023-10-27 NOTE — Progress Notes (Signed)
   10/27/2023  Patient ID: Nathaniel Waters, male   DOB: 1955-12-24, 68 y.o.   MRN: [id number]  Patient engaged with clinical pharmacist for management of diabetes on 09/28/2023. Outreach by [employer] technician was requested.   Outreached patient to discuss diabetes medication management. Left voicemail for patient to return my call at their convenience.     Michala Deblanc, CPhT  Population Health Pharmacy Office: [phone] Email: [email or web address]

## 2023-10-29 ENCOUNTER — Telehealth: Payer: Self-pay | Admitting: Pharmacy Technician

## 2023-10-29 NOTE — Progress Notes (Signed)
   10/29/2023 Name: Nathaniel Waters MRN: [id number] DOB: [birth year]  Patient is appearing on a report for [employer] [place] [employer] Diabetes and last engaged with the clinical pharmacist to discuss diabetes on 09/28/2023. Contacted patient today to discuss diabetes management and completed medication review.   Diabetes Plan from last clinical pharmacist appointment:  Diabetes: - Currently uncontrolled, A1c goal <7%. Pt reported BG are improved from previous  - Reviewed goal A1c, goal fasting, and goal 2 hour post prandial glucose - Recommend to continue current regimen since Ozempic  was just increased - Recommend to check glucose daily, checking occasionally in the evenings -Follow Up Plan: Tech f/u in 1 month, A1c check early September    Medication Adherence Barriers Identified:  Patient made recommended medication changes per plan: Yes Patient informs he has  taken 3 doses of Ozempic  1mg . He reports no adverse side effects from increasing the dose He informs he had to stop for 1 week due to a dental procedure at the advise of the dentist. He informs he is starting it back this week. He informs he is also taking Farxiga  10mg  daily and Glipizide  XL 5mg  daily. Access issues with any new medication or testing device: No Per Dr Annemarie, patient last picked up Ozempic  1mg  on 7/22 for 18 days supply, Farxiga  on 6/25 for 90 day supply and Glipizide  XL on 08/21/23 for 90 day supply. Patient is checking blood sugars as prescribed: Yes Patient informs he checks blood sugar once a day and sometimes in the evening. He has noticed no change in blood sugar since increasing Ozempic  to 1mg . Patient reports blood sugars are ranging between 120-130 in the mornings depending on what he has eaten or done activity wise. He also informs he has not seen a change in his weight. He informs he is still around 215-216 lbs. Educated patient to contact pharmacy regarding new prescriptions/refills  Reviewed instructions for monitoring  blood sugars at home and reminded patient to keep a written log to review with pharmacist Reminded patient of date/time of upcoming clinical pharmacist follow up and any upcoming PCP/specialists visits. Patient denies transportation barriers to the appointment. Yes  Next clinical pharmacist appointment is scheduled for: TBD  Kate Caddy, CPhT [facility] Population [facility] Pharmacy Office: [phone] Email: [name]

## 2023-12-01 ENCOUNTER — Other Ambulatory Visit: Admitting: Pharmacist

## 2023-12-01 DIAGNOSIS — E1169 Type 2 diabetes mellitus with other specified complication: Secondary | ICD-10-CM

## 2023-12-01 DIAGNOSIS — Z7984 Long term (current) use of oral hypoglycemic drugs: Secondary | ICD-10-CM

## 2023-12-01 DIAGNOSIS — Z7985 Long-term (current) use of injectable non-insulin antidiabetic drugs: Secondary | ICD-10-CM

## 2023-12-01 DIAGNOSIS — E785 Hyperlipidemia, unspecified: Secondary | ICD-10-CM

## 2023-12-01 NOTE — Progress Notes (Signed)
 12/01/2023 Name: Nathaniel Waters MRN: [id number] DOB: November 07, 1955  Chief Complaint  Patient presents with   Diabetes   Medication Management    Nathaniel Waters is a 68 y.o. year old male who presented for a telephone visit.   They were referred to the pharmacist by a quality report for assistance in managing diabetes.   Subjective:  Care Team: Primary Care Provider: Purcell Emil Schanz, MD ; Next Scheduled Visit: 06/21/2024  Medication Access/Adherence  Current Pharmacy:  [id number] [place], [place] - [address] AT [facility] [address] [place] [address] Phone: [phone] Fax: [phone]   Patient reports affordability concerns with their medications: No  Patient reports access/transportation concerns to their pharmacy: No  Patient reports adherence concerns with their medications:  No    Diabetes:  Current medications: Farxiga  10 mg daily, glipizide  XL 5 mg daily, Ozempic  1 mg weekly  Medications tried in the past: metformin  Pt notes he has had no N/V, constipation or diarrhea with Ozempic . His Ozempic  was $0 copay last time he got it filled  Current glucose readings: BG 125 fasting this morning,  Using glucometer; testing 1-2 time daily   Current physical activity: walks daily after breakfast   Objective:  Lab Results  Component Value Date   HGBA1C 8.9 (A) 09/08/2023    Lab Results  Component Value Date   CREATININE 1.33 06/16/2023   BUN 23 06/16/2023   NA 136 06/16/2023   K 4.5 06/16/2023   CL 101 06/16/2023   CO2 25 06/16/2023    Lab Results  Component Value Date   CHOL 99 06/16/2023   HDL 36.30 (L) 06/16/2023   LDLCALC 37 06/16/2023   LDLDIRECT 206.0 07/09/2021   TRIG 126.0 06/16/2023   CHOLHDL 3 06/16/2023    Medications Reviewed Today     Reviewed by Merceda Lela SAUNDERS, RPH (Pharmacist) on 12/01/23 at 1506  Med List Status: <None>   Medication Order Taking? Sig Documenting  Provider Last Dose Status Informant  clopidogrel  (PLAVIX ) 75 MG tablet [id number]  TAKE 1 TABLET BY MOUTH EVERY DAY Sagardia, Miguel Jose, MD  Active   ezetimibe  (ZETIA ) 10 MG tablet [id number]  TAKE 1 TABLET BY MOUTH EVERY DAY Sagardia, Miguel Jose, MD  Active   FARXIGA  10 MG TABS tablet [id number] Yes TAKE 1 TABLET BY MOUTH EVERY DAY Sagardia, Miguel Jose, MD  Active   glipiZIDE  (GLUCOTROL  XL) 5 MG 24 hr tablet [id number] Yes TAKE 1 TABLET BY MOUTH EVERY DAY WITH BREAKFAST Sagardia, Miguel Jose, MD  Active   glucose blood (ACCU-CHEK GUIDE TEST) test strip [id number]  Use as instructed Sagardia, Miguel Jose, MD  Active   predniSONE  (DELTASONE ) 20 MG tablet [id number]  Take 2 tablets (40 mg total) by mouth daily. Rolinda Rogue, MD  Active   rosuvastatin  (CRESTOR ) 40 MG tablet [id number]  TAKE 1 TABLET BY MOUTH EVERY DAY Sagardia, Miguel Jose, MD  Active   Semaglutide , 1 MG/DOSE, 4 MG/3ML SOPN [id number] Yes Inject 1 mg as directed once a week. Sagardia, Miguel Jose, MD  Active   valsartan -hydrochlorothiazide  (DIOVAN -HCT) 160-12.5 MG tablet [id number]  TAKE 1 TABLET BY MOUTH EVERY DAY Purcell Emil Schanz, MD  Active               Assessment/Plan:   Diabetes: - Currently uncontrolled, A1c goal <7%. Pt reported BG are improved from previous  - Reviewed goal A1c, goal fasting, and goal 2 hour post prandial glucose - Continue current  regimen - Recommend to check glucose daily, checking occasionally in the evenings - Walk in A1c check on September 5th  Follow Up Plan: F/u on A1c   Lum Ricks, PharmD Candidate  [facility] [employer]    Darrelyn Drum, PharmD, [employer], CPP Clinical Pharmacist Practitioner [place] [facility] at [facility] [phone]

## 2023-12-01 NOTE — Patient Instructions (Addendum)
 It was a pleasure speaking with you today!  Go to the lab at [facility] on September 5th for an A1c check.  Continue current medications.   Feel free to call with any questions or concerns!  Lum Ricks, PharmD Candidate  [facility] [employer]    Darrelyn Drum, PharmD, [employer], CPP Clinical Pharmacist Practitioner [place] [facility] at [facility] [phone]

## 2023-12-14 ENCOUNTER — Other Ambulatory Visit: Payer: Self-pay | Admitting: Emergency Medicine

## 2023-12-14 DIAGNOSIS — I152 Hypertension secondary to endocrine disorders: Secondary | ICD-10-CM

## 2023-12-30 ENCOUNTER — Telehealth: Payer: Self-pay | Admitting: Pharmacist

## 2023-12-30 NOTE — Telephone Encounter (Signed)
 Called patient to remind him that his A1c check is due. Asked him to come for walk-in labs tomorrow or Friday, no appointment needed. Will call with the results.  Darrelyn Drum, PharmD, BCPS, CPP Clinical Pharmacist Practitioner [place] [facility] at [facility] [phone]

## 2023-12-31 ENCOUNTER — Ambulatory Visit: Payer: Self-pay | Admitting: Pharmacist

## 2023-12-31 ENCOUNTER — Other Ambulatory Visit (INDEPENDENT_AMBULATORY_CARE_PROVIDER_SITE_OTHER)

## 2023-12-31 DIAGNOSIS — E1169 Type 2 diabetes mellitus with other specified complication: Secondary | ICD-10-CM | POA: Diagnosis not present

## 2023-12-31 DIAGNOSIS — E785 Hyperlipidemia, unspecified: Secondary | ICD-10-CM | POA: Diagnosis not present

## 2023-12-31 LAB — HEMOGLOBIN A1C: Hgb A1c MFr Bld: 8.6 % — ABNORMAL HIGH (ref 4.6–6.5)

## 2024-01-07 NOTE — Telephone Encounter (Unsigned)
 Copied from CRM [id number]. Topic: Clinical - Lab/Test Results >> Jan 07, 2024  3:13 PM Rea C wrote: Reason for CRM:  Patient called in regards to A1C. He said he has not heard anything. Relayed note but patient would like a phone call to further discuss medication adjustments if needed.   [phone] (M)

## 2024-01-11 ENCOUNTER — Telehealth: Payer: Self-pay | Admitting: Pharmacist

## 2024-01-11 NOTE — Progress Notes (Signed)
 Contacted patient to discuss medication management following recent updated A1c. Reviewed recent A1c result of 8.6%. Pt unable to discuss in detail now. Scheduled telephone appointment for tomorrow, 10/7 at 1:30 PM to discuss diabetes management.  Darrelyn Drum, PharmD, BCPS, CPP Clinical Pharmacist Practitioner [place] [facility] at [facility] [phone]

## 2024-01-12 ENCOUNTER — Other Ambulatory Visit

## 2024-01-12 DIAGNOSIS — E785 Hyperlipidemia, unspecified: Secondary | ICD-10-CM

## 2024-01-12 DIAGNOSIS — E1169 Type 2 diabetes mellitus with other specified complication: Secondary | ICD-10-CM

## 2024-01-12 NOTE — Progress Notes (Signed)
 01/12/2024 Name: Nathaniel Waters MRN: [id number] DOB: 05/25/1955  Chief Complaint  Patient presents with   Diabetes   Medication Management    Nathaniel Waters is a 68 y.o. year old male who presented for a telephone visit.   They were referred to the pharmacist by a quality report for assistance in managing diabetes.   Subjective:  Care Team: Primary Care Provider: Purcell Emil Schanz, MD ; Next Scheduled Visit: 06/2024  Medication Access/Adherence  Current Pharmacy:  [id number] [place], [place] - [address] AT [facility] [address] [place] [address] Phone: [phone] Fax: [phone]   Patient reports affordability concerns with their medications: No  Patient reports access/transportation concerns to their pharmacy: No  Patient reports adherence concerns with their medications:  No    Diabetes:  Current medications: Farxiga  10 mg daily, glipizide  XL 5 mg daily, Ozempic  1 mg weekly  Medications tried in the past: metformin   Current glucose readings: Checks 2-3x per day. BG 120 in the AM, 95-110 after walking. Hasn't seen higher than 124, even post prandial  Using glucometer; testing 1 time daily   Current physical activity: walks daily after breakfast   Objective:  Lab Results  Component Value Date   HGBA1C 8.6 (H) 12/31/2023    Lab Results  Component Value Date   CREATININE 1.33 06/16/2023   BUN 23 06/16/2023   NA 136 06/16/2023   K 4.5 06/16/2023   CL 101 06/16/2023   CO2 25 06/16/2023    Lab Results  Component Value Date   CHOL 99 06/16/2023   HDL 36.30 (L) 06/16/2023   LDLCALC 37 06/16/2023   LDLDIRECT 206.0 07/09/2021   TRIG 126.0 06/16/2023   CHOLHDL 3 06/16/2023    Medications Reviewed Today     Reviewed by Merceda Lela SAUNDERS, RPH (Pharmacist) on 01/12/24 at 1630  Med List Status: <None>   Medication Order Taking? Sig Documenting Provider Last Dose Status Informant  clopidogrel   (PLAVIX ) 75 MG tablet [id number]  TAKE 1 TABLET BY MOUTH EVERY DAY Sagardia, Miguel Jose, MD  Active   ezetimibe  (ZETIA ) 10 MG tablet [id number]  TAKE 1 TABLET BY MOUTH EVERY DAY Sagardia, Miguel Jose, MD  Active   FARXIGA  10 MG TABS tablet [id number] Yes TAKE 1 TABLET BY MOUTH EVERY DAY Sagardia, Miguel Jose, MD  Active   glipiZIDE  (GLUCOTROL  XL) 5 MG 24 hr tablet [id number] Yes TAKE 1 TABLET BY MOUTH EVERY DAY WITH BREAKFAST Sagardia, Miguel Jose, MD  Active   glucose blood (ACCU-CHEK GUIDE TEST) test strip [id number]  Use as instructed Sagardia, Miguel Jose, MD  Active   predniSONE  (DELTASONE ) 20 MG tablet [id number]  Take 2 tablets (40 mg total) by mouth daily. Rolinda Rogue, MD  Active   rosuvastatin  (CRESTOR ) 40 MG tablet [id number]  TAKE 1 TABLET BY MOUTH EVERY DAY Purcell Emil Schanz, MD  Active   Semaglutide , 1 MG/DOSE, (OZEMPIC , 1 MG/DOSE,) 4 MG/3ML SOPN [id number] Yes INJECT 1 MG ONCE A WEEK AS DIRECTED Purcell Emil Schanz, MD  Active   valsartan -hydrochlorothiazide  (DIOVAN -HCT) 160-12.5 MG tablet [id number]  TAKE 1 TABLET BY MOUTH EVERY DAY Purcell Emil Schanz, MD  Active               Assessment/Plan:   Diabetes: - Currently uncontrolled, A1c goal <7%. Pt reported home BG readings are at goal however A1c remains uncontrolled.  - Reviewed goal A1c, goal fasting, and goal 2 hour post prandial glucose - Recommend to  continue current regimen  - Recommend to check glucose daily, checking occasionally in the evenings - Will have patient come next week to apply Libre 3 sensor sample to assess where elevations in BG are occurring before making adjustments.    Follow Up Plan: 10/12  Darrelyn Drum, PharmD, BCPS, CPP Clinical Pharmacist Practitioner [place] [facility] at [facility] [phone]

## 2024-01-12 NOTE — Patient Instructions (Signed)
 It was a pleasure speaking with you today!  Will have patient come next week to apply Libre 3 sensor sample to assess where elevations in BG are occurring before making adjustments.  Feel free to call with any questions or concerns!  Darrelyn Drum, PharmD, BCPS, CPP Clinical Pharmacist Practitioner [place] [facility] at [facility] [phone]

## 2024-01-18 ENCOUNTER — Ambulatory Visit: Admitting: Pharmacist

## 2024-01-18 DIAGNOSIS — E1169 Type 2 diabetes mellitus with other specified complication: Secondary | ICD-10-CM

## 2024-01-18 NOTE — Progress Notes (Signed)
 01/18/2024 Name: Nathaniel Waters MRN: [id number] DOB: Mar 12, 1956  Chief Complaint  Patient presents with   Diabetes   Medication Management    Augustine Leverette is a 68 y.o. year old male who presented for a telephone visit.   They were referred to the pharmacist by a quality report for assistance in managing diabetes.   Subjective:  Care Team: Primary Care Provider: Purcell Emil Schanz, MD ; Next Scheduled Visit: 06/2024  Medication Access/Adherence  Current Pharmacy:  [id number] [place], [place] - [address] AT [facility] [address] [place] [address] Phone: [phone] Fax: [phone]   Patient reports affordability concerns with their medications: No  Patient reports access/transportation concerns to their pharmacy: No  Patient reports adherence concerns with their medications:  No    Diabetes:  Current medications: Farxiga  10 mg daily, glipizide  XL 5 mg daily, Ozempic  1 mg weekly  Medications tried in the past: metformin   Current glucose readings: Checks 2-3x per day. BG 125 this AM, 95-110 after walking. Hasn't seen higher than 124, even post prandial  Using glucometer; testing 2x time daily   Current physical activity: walks daily after breakfast   Objective:  Lab Results  Component Value Date   HGBA1C 8.6 (H) 12/31/2023    Lab Results  Component Value Date   CREATININE 1.33 06/16/2023   BUN 23 06/16/2023   NA 136 06/16/2023   K 4.5 06/16/2023   CL 101 06/16/2023   CO2 25 06/16/2023    Lab Results  Component Value Date   CHOL 99 06/16/2023   HDL 36.30 (L) 06/16/2023   LDLCALC 37 06/16/2023   LDLDIRECT 206.0 07/09/2021   TRIG 126.0 06/16/2023   CHOLHDL 3 06/16/2023    Medications Reviewed Today     Reviewed by Merceda Lela SAUNDERS, RPH (Pharmacist) on 01/18/24 at 1443  Med List Status: <None>   Medication Order Taking? Sig Documenting Provider Last Dose Status Informant  clopidogrel   (PLAVIX ) 75 MG tablet [id number]  TAKE 1 TABLET BY MOUTH EVERY DAY Sagardia, Miguel Jose, MD  Active   ezetimibe  (ZETIA ) 10 MG tablet [id number]  TAKE 1 TABLET BY MOUTH EVERY DAY Sagardia, Miguel Jose, MD  Active   FARXIGA  10 MG TABS tablet [id number] Yes TAKE 1 TABLET BY MOUTH EVERY DAY Sagardia, Miguel Jose, MD  Active   glipiZIDE  (GLUCOTROL  XL) 5 MG 24 hr tablet [id number] Yes TAKE 1 TABLET BY MOUTH EVERY DAY WITH BREAKFAST Sagardia, Miguel Jose, MD  Active   glucose blood (ACCU-CHEK GUIDE TEST) test strip [id number]  Use as instructed Sagardia, Miguel Jose, MD  Active     Discontinued 01/18/24 1314 (Completed Course)   rosuvastatin  (CRESTOR ) 40 MG tablet [id number]  TAKE 1 TABLET BY MOUTH EVERY DAY Purcell Emil Schanz, MD  Active   Semaglutide , 1 MG/DOSE, (OZEMPIC , 1 MG/DOSE,) 4 MG/3ML SOPN [id number] Yes INJECT 1 MG ONCE A WEEK AS DIRECTED Purcell Emil Schanz, MD  Active   valsartan -hydrochlorothiazide  (DIOVAN -HCT) 160-12.5 MG tablet [id number]  TAKE 1 TABLET BY MOUTH EVERY DAY Purcell Emil Schanz, MD  Active               Assessment/Plan:   Diabetes: - Currently uncontrolled, A1c goal <7%. Pt reported home BG readings are at goal however A1c remains uncontrolled.  - Reviewed goal A1c, goal fasting, and goal 2 hour post prandial glucose - Recommend to continue current regimen  - Recommend to check glucose daily, checking occasionally in the evenings -  [employer] 3 sensor sample to assess where elevations in BG are occurring before making adjustments. Connected Libre 3 sensor to patient's app and connected him to LibreView    Follow Up Plan: 10/28  Darrelyn Drum, PharmD, BCPS, CPP Clinical Pharmacist Practitioner [place] [facility] at [facility] [phone]

## 2024-01-18 NOTE — Patient Instructions (Signed)
 It was a pleasure speaking with you today!  Wear the Libre 3 Sensor for 2 weeks. We will discuss the results in 2 weeks by phone to see if adjustments are needed.  Feel free to call with any questions or concerns!  Darrelyn Drum, PharmD, BCPS, CPP Clinical Pharmacist Practitioner [place] [facility] at [facility] [phone]

## 2024-02-02 ENCOUNTER — Other Ambulatory Visit: Admitting: Pharmacist

## 2024-02-02 DIAGNOSIS — E1169 Type 2 diabetes mellitus with other specified complication: Secondary | ICD-10-CM

## 2024-02-02 DIAGNOSIS — E785 Hyperlipidemia, unspecified: Secondary | ICD-10-CM

## 2024-02-02 NOTE — Progress Notes (Signed)
 02/02/2024 Name: Nathaniel Waters MRN: [id number] DOB: 1955-06-14  Chief Complaint  Patient presents with   Diabetes   Medication Management    Nathaniel Waters is a 68 y.o. year old male who presented for a telephone visit.   They were referred to the pharmacist by a quality report for assistance in managing diabetes.   Subjective:  Care Team: Primary Care Provider: Purcell Emil Schanz, MD ; Next Scheduled Visit: 06/2024  Medication Access/Adherence  Current Pharmacy:  [id number] - [place], [place] - [address] AT [facility] [address] [place] [address] Phone: [phone] Fax: [phone]   Patient reports affordability concerns with their medications: No  Patient reports access/transportation concerns to their pharmacy: No  Patient reports adherence concerns with their medications:  No    Diabetes:  Current medications: Farxiga  10 mg daily, glipizide  XL 5 mg daily, Ozempic  1 mg weekly  Medications tried in the past: metformin   Current glucose readings: Typically checks 2-3x per day by finger stick  Freestyle Libre 3 2 week trial sensor:   Using glucometer; testing 2x time daily   Current physical activity: walks daily after breakfast   Objective:  Lab Results  Component Value Date   HGBA1C 8.6 (H) 12/31/2023    Lab Results  Component Value Date   CREATININE 1.33 06/16/2023   BUN 23 06/16/2023   NA 136 06/16/2023   K 4.5 06/16/2023   CL 101 06/16/2023   CO2 25 06/16/2023    Lab Results  Component Value Date   CHOL 99 06/16/2023   HDL 36.30 (L) 06/16/2023   LDLCALC 37 06/16/2023   LDLDIRECT 206.0 07/09/2021   TRIG 126.0 06/16/2023   CHOLHDL 3 06/16/2023    Medications Reviewed Today     Reviewed by Merceda Lela SAUNDERS, RPH (Pharmacist) on 02/02/24 at 1200  Med List Status: <None>   Medication Order Taking? Sig Documenting Provider Last Dose Status Informant  clopidogrel  (PLAVIX ) 75 MG  tablet [id number] Yes TAKE 1 TABLET BY MOUTH EVERY DAY Sagardia, Miguel Jose, MD  Active   ezetimibe  (ZETIA ) 10 MG tablet [id number] Yes TAKE 1 TABLET BY MOUTH EVERY DAY Sagardia, Miguel Jose, MD  Active   FARXIGA  10 MG TABS tablet [id number] Yes TAKE 1 TABLET BY MOUTH EVERY DAY Sagardia, Miguel Jose, MD  Active   glipiZIDE  (GLUCOTROL  XL) 5 MG 24 hr tablet [id number] Yes TAKE 1 TABLET BY MOUTH EVERY DAY WITH BREAKFAST Sagardia, Miguel Jose, MD  Active   glucose blood (ACCU-CHEK GUIDE TEST) test strip [id number] Yes Use as instructed Sagardia, Miguel Jose, MD  Active   rosuvastatin  (CRESTOR ) 40 MG tablet [id number] Yes TAKE 1 TABLET BY MOUTH EVERY DAY Purcell Emil Schanz, MD  Active   Semaglutide , 1 MG/DOSE, (OZEMPIC , 1 MG/DOSE,) 4 MG/3ML NELMA [id number] Yes INJECT 1 MG ONCE A WEEK AS DIRECTED Purcell Emil Schanz, MD  Active   valsartan -hydrochlorothiazide  (DIOVAN -HCT) 160-12.5 MG tablet [id number] Yes TAKE 1 TABLET BY MOUTH EVERY DAY Purcell Emil Schanz, MD  Active               Assessment/Plan:   Diabetes: - Currently controlled, A1c goal <7% per [place] data. 2 week GMI is 6.5%, which does not match recent A1c of 8.6% - Reviewed goal A1c, goal fasting, and goal 2 hour post prandial glucose - Recommend to continue current regimen  - Recommend to check glucose daily, checking occasionally in the evenings -  Recommend rechecking A1c in December along  with fructosamine to confirm falsely elevated A1c reading on lab.    Follow Up Plan: Walk in labs after 12/25  Darrelyn Drum, PharmD, BCPS, CPP Clinical Pharmacist Practitioner [facility] [phone]

## 2024-02-02 NOTE — Patient Instructions (Signed)
 It was a pleasure speaking with you today!  Continue current regimen.  Feel free to call with any questions or concerns!  Darrelyn Drum, PharmD, BCPS, CPP Clinical Pharmacist Practitioner [facility] [phone]

## 2024-03-09 ENCOUNTER — Telehealth: Payer: Self-pay

## 2024-03-09 DIAGNOSIS — E1159 Type 2 diabetes mellitus with other circulatory complications: Secondary | ICD-10-CM

## 2024-03-09 NOTE — Telephone Encounter (Signed)
 Patient due for A1C per Thn. Appt made for 03/10/2024

## 2024-03-10 ENCOUNTER — Other Ambulatory Visit

## 2024-03-10 DIAGNOSIS — E1159 Type 2 diabetes mellitus with other circulatory complications: Secondary | ICD-10-CM

## 2024-03-10 DIAGNOSIS — I152 Hypertension secondary to endocrine disorders: Secondary | ICD-10-CM | POA: Diagnosis not present

## 2024-03-10 LAB — HEMOGLOBIN A1C: Hgb A1c MFr Bld: 8.1 % — ABNORMAL HIGH (ref 4.6–6.5)

## 2024-03-10 NOTE — Addendum Note (Signed)
 Addended by: ROSALVA LEX RAMAN on: 03/10/2024 11:37 AM   Modules accepted: Orders

## 2024-03-17 ENCOUNTER — Telehealth: Payer: Self-pay

## 2024-03-17 NOTE — Telephone Encounter (Signed)
 Patient was identified as falling into the [place] Measure - Diabetes.   Patient was: Requires a call back at a later time. Patient had A1C drawn 12/4.

## 2024-03-21 ENCOUNTER — Other Ambulatory Visit: Payer: Self-pay | Admitting: Emergency Medicine

## 2024-03-21 DIAGNOSIS — I152 Hypertension secondary to endocrine disorders: Secondary | ICD-10-CM

## 2024-03-30 ENCOUNTER — Encounter: Payer: Self-pay | Admitting: *Deleted

## 2024-03-30 NOTE — Progress Notes (Signed)
 Nathaniel Waters                                          MRN: [id number]   03/30/2024   The [facility] Quality Team Specialist reviewed this patient medical record for the purposes of chart review for care gap closure. The following were reviewed: abstraction for care gap closure-glycemic status assessment.    [facility] Quality Team

## 2024-04-06 ENCOUNTER — Other Ambulatory Visit: Payer: Self-pay | Admitting: Emergency Medicine

## 2024-04-06 DIAGNOSIS — E1159 Type 2 diabetes mellitus with other circulatory complications: Secondary | ICD-10-CM

## 2024-04-25 ENCOUNTER — Other Ambulatory Visit: Payer: Self-pay | Admitting: Emergency Medicine

## 2024-05-02 ENCOUNTER — Other Ambulatory Visit: Payer: Self-pay | Admitting: Emergency Medicine

## 2024-05-02 DIAGNOSIS — Z8673 Personal history of transient ischemic attack (TIA), and cerebral infarction without residual deficits: Secondary | ICD-10-CM

## 2024-05-02 NOTE — Progress Notes (Signed)
 Nathaniel Waters                                          MRN: [id number]   05/02/2024   The [facility] Quality Team Specialist reviewed this patient medical record for the purposes of chart review for care gap closure. The following were reviewed: chart review for care gap closure-glycemic status assessment.    [facility] Quality Team

## 2024-06-21 ENCOUNTER — Ambulatory Visit

## 2024-06-21 ENCOUNTER — Encounter: Admitting: Emergency Medicine
